# Patient Record
Sex: Female | Born: 1945 | Hispanic: No | Smoking: Never smoker
Health system: Southern US, Community
[De-identification: ages and names within clinical notes are randomized; demographics above are authoritative.]

## PROBLEM LIST (undated history)

## (undated) DIAGNOSIS — I10 Essential (primary) hypertension: Secondary | ICD-10-CM

## (undated) DIAGNOSIS — R609 Edema, unspecified: Secondary | ICD-10-CM

## (undated) DIAGNOSIS — I482 Chronic atrial fibrillation, unspecified: Principal | ICD-10-CM

## (undated) DIAGNOSIS — D62 Acute posthemorrhagic anemia: Secondary | ICD-10-CM

## (undated) DIAGNOSIS — K922 Gastrointestinal hemorrhage, unspecified: Secondary | ICD-10-CM

## (undated) DIAGNOSIS — Z950 Presence of cardiac pacemaker: Secondary | ICD-10-CM

## (undated) DIAGNOSIS — E119 Type 2 diabetes mellitus without complications: Secondary | ICD-10-CM

## (undated) DIAGNOSIS — R21 Rash and other nonspecific skin eruption: Secondary | ICD-10-CM

## (undated) DIAGNOSIS — I4891 Unspecified atrial fibrillation: Secondary | ICD-10-CM

## (undated) DIAGNOSIS — I5032 Chronic diastolic (congestive) heart failure: Secondary | ICD-10-CM

## (undated) DIAGNOSIS — K219 Gastro-esophageal reflux disease without esophagitis: Secondary | ICD-10-CM

## (undated) DIAGNOSIS — E785 Hyperlipidemia, unspecified: Secondary | ICD-10-CM

## (undated) DIAGNOSIS — J181 Lobar pneumonia, unspecified organism: Secondary | ICD-10-CM

## (undated) HISTORY — DX: Gastro-esophageal reflux disease without esophagitis: K21.9

## (undated) HISTORY — DX: Rash and other nonspecific skin eruption: R21

## (undated) HISTORY — DX: Chronic atrial fibrillation, unspecified: I48.20

## (undated) HISTORY — DX: Type 2 diabetes mellitus without complications: E11.9

## (undated) HISTORY — DX: Presence of cardiac pacemaker: Z95.0

## (undated) HISTORY — DX: Essential (primary) hypertension: I10

## (undated) HISTORY — DX: Lobar pneumonia, unspecified organism: J18.1

## (undated) HISTORY — DX: Edema, unspecified: R60.9

## (undated) HISTORY — DX: Hyperlipidemia, unspecified: E78.5

## (undated) HISTORY — PX: CATARACT EXTRACTION: SUR2

## (undated) HISTORY — DX: Gastrointestinal hemorrhage, unspecified: K92.2

## (undated) HISTORY — DX: Acute posthemorrhagic anemia: D62

## (undated) HISTORY — PX: AV FISTULA REPAIR: SHX563

---

## 1993-02-22 HISTORY — PX: ABDOMINAL HYSTERECTOMY: SHX81

## 2004-01-27 ENCOUNTER — Ambulatory Visit: Payer: Self-pay | Admitting: Internal Medicine

## 2011-11-08 ENCOUNTER — Other Ambulatory Visit: Payer: Self-pay | Admitting: Internal Medicine

## 2011-11-17 ENCOUNTER — Telehealth (INDEPENDENT_AMBULATORY_CARE_PROVIDER_SITE_OTHER): Payer: Self-pay

## 2011-11-18 NOTE — Telephone Encounter (Signed)
Close encounter 

## 2012-06-12 ENCOUNTER — Telehealth: Payer: Self-pay | Admitting: Internal Medicine

## 2012-06-12 NOTE — Telephone Encounter (Signed)
Opened in error

## 2013-05-09 ENCOUNTER — Ambulatory Visit: Payer: Self-pay

## 2013-07-10 ENCOUNTER — Ambulatory Visit (INDEPENDENT_AMBULATORY_CARE_PROVIDER_SITE_OTHER): Payer: PRIVATE HEALTH INSURANCE | Admitting: Podiatrist

## 2013-07-10 DIAGNOSIS — L84 Corns and callosities: Secondary | ICD-10-CM

## 2013-07-10 NOTE — Patient Instructions (Signed)
Told patient to call us if she needs [place]

## 2013-07-10 NOTE — Progress Notes (Signed)
I am here to get my diabetic shoes

## 2014-03-14 DIAGNOSIS — R791 Abnormal coagulation profile: Secondary | ICD-10-CM | POA: Diagnosis not present

## 2014-03-15 DIAGNOSIS — R040 Epistaxis: Secondary | ICD-10-CM | POA: Diagnosis not present

## 2014-03-15 DIAGNOSIS — E780 Pure hypercholesterolemia: Secondary | ICD-10-CM | POA: Diagnosis not present

## 2014-03-15 DIAGNOSIS — Z7901 Long term (current) use of anticoagulants: Secondary | ICD-10-CM | POA: Diagnosis not present

## 2014-03-15 DIAGNOSIS — I4891 Unspecified atrial fibrillation: Secondary | ICD-10-CM | POA: Diagnosis not present

## 2014-03-15 DIAGNOSIS — R58 Hemorrhage, not elsewhere classified: Secondary | ICD-10-CM | POA: Diagnosis not present

## 2014-03-15 DIAGNOSIS — I10 Essential (primary) hypertension: Secondary | ICD-10-CM | POA: Diagnosis not present

## 2014-03-15 DIAGNOSIS — E119 Type 2 diabetes mellitus without complications: Secondary | ICD-10-CM | POA: Diagnosis not present

## 2014-04-01 DIAGNOSIS — E538 Deficiency of other specified B group vitamins: Secondary | ICD-10-CM | POA: Diagnosis not present

## 2014-04-01 DIAGNOSIS — E785 Hyperlipidemia, unspecified: Secondary | ICD-10-CM | POA: Diagnosis not present

## 2014-04-01 DIAGNOSIS — E1149 Type 2 diabetes mellitus with other diabetic neurological complication: Secondary | ICD-10-CM | POA: Diagnosis not present

## 2014-04-01 DIAGNOSIS — E559 Vitamin D deficiency, unspecified: Secondary | ICD-10-CM | POA: Diagnosis not present

## 2014-04-03 DIAGNOSIS — E538 Deficiency of other specified B group vitamins: Secondary | ICD-10-CM | POA: Diagnosis not present

## 2014-04-03 DIAGNOSIS — I4891 Unspecified atrial fibrillation: Secondary | ICD-10-CM | POA: Diagnosis not present

## 2014-04-03 DIAGNOSIS — I10 Essential (primary) hypertension: Secondary | ICD-10-CM | POA: Diagnosis not present

## 2014-04-03 DIAGNOSIS — J208 Acute bronchitis due to other specified organisms: Secondary | ICD-10-CM | POA: Diagnosis not present

## 2014-04-03 DIAGNOSIS — E785 Hyperlipidemia, unspecified: Secondary | ICD-10-CM | POA: Diagnosis not present

## 2014-04-03 DIAGNOSIS — D638 Anemia in other chronic diseases classified elsewhere: Secondary | ICD-10-CM | POA: Diagnosis not present

## 2014-04-08 DIAGNOSIS — Z1212 Encounter for screening for malignant neoplasm of rectum: Secondary | ICD-10-CM | POA: Diagnosis not present

## 2014-04-10 DIAGNOSIS — M8589 Other specified disorders of bone density and structure, multiple sites: Secondary | ICD-10-CM | POA: Diagnosis not present

## 2014-04-10 DIAGNOSIS — Z1231 Encounter for screening mammogram for malignant neoplasm of breast: Secondary | ICD-10-CM | POA: Diagnosis not present

## 2014-04-10 DIAGNOSIS — M8580 Other specified disorders of bone density and structure, unspecified site: Secondary | ICD-10-CM | POA: Diagnosis not present

## 2014-04-13 DIAGNOSIS — J208 Acute bronchitis due to other specified organisms: Secondary | ICD-10-CM | POA: Diagnosis not present

## 2014-04-17 DIAGNOSIS — Z7901 Long term (current) use of anticoagulants: Secondary | ICD-10-CM | POA: Diagnosis not present

## 2014-05-01 DIAGNOSIS — R791 Abnormal coagulation profile: Secondary | ICD-10-CM | POA: Diagnosis not present

## 2014-05-08 DIAGNOSIS — Z7901 Long term (current) use of anticoagulants: Secondary | ICD-10-CM | POA: Diagnosis not present

## 2014-05-22 DIAGNOSIS — R791 Abnormal coagulation profile: Secondary | ICD-10-CM | POA: Diagnosis not present

## 2014-06-24 DIAGNOSIS — Z7901 Long term (current) use of anticoagulants: Secondary | ICD-10-CM | POA: Diagnosis not present

## 2014-07-04 DIAGNOSIS — Z45010 Encounter for checking and testing of cardiac pacemaker pulse generator [battery]: Secondary | ICD-10-CM | POA: Diagnosis not present

## 2014-07-04 DIAGNOSIS — I495 Sick sinus syndrome: Secondary | ICD-10-CM | POA: Diagnosis not present

## 2014-07-05 DIAGNOSIS — E785 Hyperlipidemia, unspecified: Secondary | ICD-10-CM | POA: Diagnosis not present

## 2014-07-05 DIAGNOSIS — D509 Iron deficiency anemia, unspecified: Secondary | ICD-10-CM | POA: Diagnosis not present

## 2014-07-05 DIAGNOSIS — E1149 Type 2 diabetes mellitus with other diabetic neurological complication: Secondary | ICD-10-CM | POA: Diagnosis not present

## 2014-07-05 DIAGNOSIS — E538 Deficiency of other specified B group vitamins: Secondary | ICD-10-CM | POA: Diagnosis not present

## 2014-07-08 DIAGNOSIS — E785 Hyperlipidemia, unspecified: Secondary | ICD-10-CM | POA: Diagnosis not present

## 2014-07-08 DIAGNOSIS — I480 Paroxysmal atrial fibrillation: Secondary | ICD-10-CM | POA: Diagnosis not present

## 2014-07-08 DIAGNOSIS — I10 Essential (primary) hypertension: Secondary | ICD-10-CM | POA: Diagnosis not present

## 2014-07-08 DIAGNOSIS — Z1389 Encounter for screening for other disorder: Secondary | ICD-10-CM | POA: Diagnosis not present

## 2014-07-08 DIAGNOSIS — E1149 Type 2 diabetes mellitus with other diabetic neurological complication: Secondary | ICD-10-CM | POA: Diagnosis not present

## 2014-07-08 DIAGNOSIS — Z9181 History of falling: Secondary | ICD-10-CM | POA: Diagnosis not present

## 2014-07-29 DIAGNOSIS — Z7901 Long term (current) use of anticoagulants: Secondary | ICD-10-CM | POA: Diagnosis not present

## 2014-08-30 DIAGNOSIS — Z7901 Long term (current) use of anticoagulants: Secondary | ICD-10-CM | POA: Diagnosis not present

## 2014-10-01 DIAGNOSIS — R791 Abnormal coagulation profile: Secondary | ICD-10-CM | POA: Diagnosis not present

## 2014-10-07 DIAGNOSIS — Z45010 Encounter for checking and testing of cardiac pacemaker pulse generator [battery]: Secondary | ICD-10-CM | POA: Diagnosis not present

## 2014-10-07 DIAGNOSIS — I498 Other specified cardiac arrhythmias: Secondary | ICD-10-CM | POA: Diagnosis not present

## 2014-10-14 DIAGNOSIS — E559 Vitamin D deficiency, unspecified: Secondary | ICD-10-CM | POA: Diagnosis not present

## 2014-10-14 DIAGNOSIS — E1149 Type 2 diabetes mellitus with other diabetic neurological complication: Secondary | ICD-10-CM | POA: Diagnosis not present

## 2014-10-14 DIAGNOSIS — D519 Vitamin B12 deficiency anemia, unspecified: Secondary | ICD-10-CM | POA: Diagnosis not present

## 2014-10-14 DIAGNOSIS — D509 Iron deficiency anemia, unspecified: Secondary | ICD-10-CM | POA: Diagnosis not present

## 2014-10-14 DIAGNOSIS — E785 Hyperlipidemia, unspecified: Secondary | ICD-10-CM | POA: Diagnosis not present

## 2014-10-17 DIAGNOSIS — E785 Hyperlipidemia, unspecified: Secondary | ICD-10-CM | POA: Diagnosis not present

## 2014-10-17 DIAGNOSIS — Z1389 Encounter for screening for other disorder: Secondary | ICD-10-CM | POA: Diagnosis not present

## 2014-10-17 DIAGNOSIS — I10 Essential (primary) hypertension: Secondary | ICD-10-CM | POA: Diagnosis not present

## 2014-10-17 DIAGNOSIS — I480 Paroxysmal atrial fibrillation: Secondary | ICD-10-CM | POA: Diagnosis not present

## 2014-10-17 DIAGNOSIS — E1140 Type 2 diabetes mellitus with diabetic neuropathy, unspecified: Secondary | ICD-10-CM | POA: Diagnosis not present

## 2014-10-21 DIAGNOSIS — D500 Iron deficiency anemia secondary to blood loss (chronic): Secondary | ICD-10-CM | POA: Diagnosis not present

## 2014-11-04 DIAGNOSIS — Z7901 Long term (current) use of anticoagulants: Secondary | ICD-10-CM | POA: Diagnosis not present

## 2014-11-05 DIAGNOSIS — K219 Gastro-esophageal reflux disease without esophagitis: Secondary | ICD-10-CM | POA: Diagnosis not present

## 2014-11-05 DIAGNOSIS — E119 Type 2 diabetes mellitus without complications: Secondary | ICD-10-CM | POA: Diagnosis not present

## 2014-11-05 DIAGNOSIS — Z950 Presence of cardiac pacemaker: Secondary | ICD-10-CM | POA: Diagnosis not present

## 2014-11-05 DIAGNOSIS — R195 Other fecal abnormalities: Secondary | ICD-10-CM | POA: Diagnosis not present

## 2014-11-05 DIAGNOSIS — D509 Iron deficiency anemia, unspecified: Secondary | ICD-10-CM | POA: Diagnosis not present

## 2014-11-05 DIAGNOSIS — D131 Benign neoplasm of stomach: Secondary | ICD-10-CM | POA: Diagnosis not present

## 2014-11-05 DIAGNOSIS — K449 Diaphragmatic hernia without obstruction or gangrene: Secondary | ICD-10-CM | POA: Diagnosis not present

## 2014-11-05 DIAGNOSIS — K5730 Diverticulosis of large intestine without perforation or abscess without bleeding: Secondary | ICD-10-CM | POA: Diagnosis not present

## 2014-11-05 DIAGNOSIS — D500 Iron deficiency anemia secondary to blood loss (chronic): Secondary | ICD-10-CM | POA: Diagnosis not present

## 2014-11-05 DIAGNOSIS — K2960 Other gastritis without bleeding: Secondary | ICD-10-CM | POA: Diagnosis not present

## 2014-11-05 DIAGNOSIS — I10 Essential (primary) hypertension: Secondary | ICD-10-CM | POA: Diagnosis not present

## 2014-11-05 DIAGNOSIS — Z1211 Encounter for screening for malignant neoplasm of colon: Secondary | ICD-10-CM | POA: Diagnosis not present

## 2014-11-05 DIAGNOSIS — M1990 Unspecified osteoarthritis, unspecified site: Secondary | ICD-10-CM | POA: Diagnosis not present

## 2014-11-05 DIAGNOSIS — K644 Residual hemorrhoidal skin tags: Secondary | ICD-10-CM | POA: Diagnosis not present

## 2014-11-05 DIAGNOSIS — Z7901 Long term (current) use of anticoagulants: Secondary | ICD-10-CM | POA: Diagnosis not present

## 2014-11-05 DIAGNOSIS — D124 Benign neoplasm of descending colon: Secondary | ICD-10-CM | POA: Diagnosis not present

## 2014-11-05 DIAGNOSIS — D122 Benign neoplasm of ascending colon: Secondary | ICD-10-CM | POA: Diagnosis not present

## 2014-11-15 DIAGNOSIS — R791 Abnormal coagulation profile: Secondary | ICD-10-CM | POA: Diagnosis not present

## 2014-12-16 DIAGNOSIS — R791 Abnormal coagulation profile: Secondary | ICD-10-CM | POA: Diagnosis not present

## 2014-12-23 DIAGNOSIS — Z7901 Long term (current) use of anticoagulants: Secondary | ICD-10-CM | POA: Diagnosis not present

## 2014-12-23 DIAGNOSIS — Z6841 Body Mass Index (BMI) 40.0 and over, adult: Secondary | ICD-10-CM | POA: Diagnosis not present

## 2014-12-23 DIAGNOSIS — J019 Acute sinusitis, unspecified: Secondary | ICD-10-CM | POA: Diagnosis not present

## 2014-12-23 DIAGNOSIS — J208 Acute bronchitis due to other specified organisms: Secondary | ICD-10-CM | POA: Diagnosis not present

## 2015-01-02 DIAGNOSIS — R791 Abnormal coagulation profile: Secondary | ICD-10-CM | POA: Diagnosis not present

## 2015-01-09 DIAGNOSIS — R791 Abnormal coagulation profile: Secondary | ICD-10-CM | POA: Diagnosis not present

## 2015-01-14 DIAGNOSIS — Z45010 Encounter for checking and testing of cardiac pacemaker pulse generator [battery]: Secondary | ICD-10-CM | POA: Diagnosis not present

## 2015-01-14 DIAGNOSIS — I498 Other specified cardiac arrhythmias: Secondary | ICD-10-CM | POA: Diagnosis not present

## 2015-01-17 DIAGNOSIS — R791 Abnormal coagulation profile: Secondary | ICD-10-CM | POA: Diagnosis not present

## 2015-01-20 DIAGNOSIS — K5730 Diverticulosis of large intestine without perforation or abscess without bleeding: Secondary | ICD-10-CM | POA: Diagnosis not present

## 2015-01-21 DIAGNOSIS — E559 Vitamin D deficiency, unspecified: Secondary | ICD-10-CM | POA: Diagnosis not present

## 2015-01-21 DIAGNOSIS — D519 Vitamin B12 deficiency anemia, unspecified: Secondary | ICD-10-CM | POA: Diagnosis not present

## 2015-01-21 DIAGNOSIS — E1140 Type 2 diabetes mellitus with diabetic neuropathy, unspecified: Secondary | ICD-10-CM | POA: Diagnosis not present

## 2015-01-21 DIAGNOSIS — D500 Iron deficiency anemia secondary to blood loss (chronic): Secondary | ICD-10-CM | POA: Diagnosis not present

## 2015-01-21 DIAGNOSIS — E785 Hyperlipidemia, unspecified: Secondary | ICD-10-CM | POA: Diagnosis not present

## 2015-01-23 DIAGNOSIS — E785 Hyperlipidemia, unspecified: Secondary | ICD-10-CM | POA: Diagnosis not present

## 2015-01-23 DIAGNOSIS — E1140 Type 2 diabetes mellitus with diabetic neuropathy, unspecified: Secondary | ICD-10-CM | POA: Diagnosis not present

## 2015-01-23 DIAGNOSIS — I10 Essential (primary) hypertension: Secondary | ICD-10-CM | POA: Diagnosis not present

## 2015-01-23 DIAGNOSIS — I480 Paroxysmal atrial fibrillation: Secondary | ICD-10-CM | POA: Diagnosis not present

## 2015-01-23 DIAGNOSIS — R791 Abnormal coagulation profile: Secondary | ICD-10-CM | POA: Diagnosis not present

## 2015-02-06 DIAGNOSIS — R791 Abnormal coagulation profile: Secondary | ICD-10-CM | POA: Diagnosis not present

## 2015-02-11 DIAGNOSIS — R040 Epistaxis: Secondary | ICD-10-CM | POA: Diagnosis not present

## 2015-02-11 DIAGNOSIS — Z6841 Body Mass Index (BMI) 40.0 and over, adult: Secondary | ICD-10-CM | POA: Diagnosis not present

## 2015-02-11 DIAGNOSIS — R791 Abnormal coagulation profile: Secondary | ICD-10-CM | POA: Diagnosis not present

## 2015-02-12 DIAGNOSIS — R040 Epistaxis: Secondary | ICD-10-CM | POA: Diagnosis not present

## 2015-02-12 DIAGNOSIS — J310 Chronic rhinitis: Secondary | ICD-10-CM | POA: Diagnosis not present

## 2015-02-12 DIAGNOSIS — T45511A Poisoning by anticoagulants, accidental (unintentional), initial encounter: Secondary | ICD-10-CM | POA: Diagnosis not present

## 2015-02-12 DIAGNOSIS — J342 Deviated nasal septum: Secondary | ICD-10-CM | POA: Diagnosis not present

## 2015-02-14 DIAGNOSIS — R791 Abnormal coagulation profile: Secondary | ICD-10-CM | POA: Diagnosis not present

## 2015-02-21 DIAGNOSIS — R791 Abnormal coagulation profile: Secondary | ICD-10-CM | POA: Diagnosis not present

## 2015-03-07 DIAGNOSIS — Z7901 Long term (current) use of anticoagulants: Secondary | ICD-10-CM | POA: Diagnosis not present

## 2015-04-09 DIAGNOSIS — Z7901 Long term (current) use of anticoagulants: Secondary | ICD-10-CM | POA: Diagnosis not present

## 2015-04-16 DIAGNOSIS — R791 Abnormal coagulation profile: Secondary | ICD-10-CM | POA: Diagnosis not present

## 2015-04-18 DIAGNOSIS — H524 Presbyopia: Secondary | ICD-10-CM | POA: Diagnosis not present

## 2015-04-18 DIAGNOSIS — I498 Other specified cardiac arrhythmias: Secondary | ICD-10-CM | POA: Diagnosis not present

## 2015-04-18 DIAGNOSIS — H43813 Vitreous degeneration, bilateral: Secondary | ICD-10-CM | POA: Diagnosis not present

## 2015-04-18 DIAGNOSIS — Z45010 Encounter for checking and testing of cardiac pacemaker pulse generator [battery]: Secondary | ICD-10-CM | POA: Diagnosis not present

## 2015-04-23 DIAGNOSIS — J208 Acute bronchitis due to other specified organisms: Secondary | ICD-10-CM | POA: Diagnosis not present

## 2015-04-23 DIAGNOSIS — J019 Acute sinusitis, unspecified: Secondary | ICD-10-CM | POA: Diagnosis not present

## 2015-04-28 DIAGNOSIS — E559 Vitamin D deficiency, unspecified: Secondary | ICD-10-CM | POA: Diagnosis not present

## 2015-04-28 DIAGNOSIS — E785 Hyperlipidemia, unspecified: Secondary | ICD-10-CM | POA: Diagnosis not present

## 2015-04-28 DIAGNOSIS — E1140 Type 2 diabetes mellitus with diabetic neuropathy, unspecified: Secondary | ICD-10-CM | POA: Diagnosis not present

## 2015-04-28 DIAGNOSIS — D519 Vitamin B12 deficiency anemia, unspecified: Secondary | ICD-10-CM | POA: Diagnosis not present

## 2015-04-28 DIAGNOSIS — D500 Iron deficiency anemia secondary to blood loss (chronic): Secondary | ICD-10-CM | POA: Diagnosis not present

## 2015-04-29 DIAGNOSIS — R791 Abnormal coagulation profile: Secondary | ICD-10-CM | POA: Diagnosis not present

## 2015-04-29 DIAGNOSIS — I10 Essential (primary) hypertension: Secondary | ICD-10-CM | POA: Diagnosis not present

## 2015-04-29 DIAGNOSIS — E1140 Type 2 diabetes mellitus with diabetic neuropathy, unspecified: Secondary | ICD-10-CM | POA: Diagnosis not present

## 2015-04-29 DIAGNOSIS — I480 Paroxysmal atrial fibrillation: Secondary | ICD-10-CM | POA: Diagnosis not present

## 2015-04-29 DIAGNOSIS — E785 Hyperlipidemia, unspecified: Secondary | ICD-10-CM | POA: Diagnosis not present

## 2015-05-01 DIAGNOSIS — K5730 Diverticulosis of large intestine without perforation or abscess without bleeding: Secondary | ICD-10-CM | POA: Diagnosis not present

## 2015-05-06 DIAGNOSIS — R791 Abnormal coagulation profile: Secondary | ICD-10-CM | POA: Diagnosis not present

## 2015-05-06 DIAGNOSIS — D649 Anemia, unspecified: Secondary | ICD-10-CM | POA: Diagnosis not present

## 2015-05-13 DIAGNOSIS — D649 Anemia, unspecified: Secondary | ICD-10-CM | POA: Diagnosis not present

## 2015-05-20 DIAGNOSIS — R791 Abnormal coagulation profile: Secondary | ICD-10-CM | POA: Diagnosis not present

## 2015-05-20 DIAGNOSIS — H6593 Unspecified nonsuppurative otitis media, bilateral: Secondary | ICD-10-CM | POA: Diagnosis not present

## 2015-05-20 DIAGNOSIS — J329 Chronic sinusitis, unspecified: Secondary | ICD-10-CM | POA: Diagnosis not present

## 2015-05-20 DIAGNOSIS — I480 Paroxysmal atrial fibrillation: Secondary | ICD-10-CM | POA: Diagnosis not present

## 2015-06-10 DIAGNOSIS — D649 Anemia, unspecified: Secondary | ICD-10-CM | POA: Diagnosis not present

## 2015-06-23 DIAGNOSIS — K5730 Diverticulosis of large intestine without perforation or abscess without bleeding: Secondary | ICD-10-CM | POA: Diagnosis not present

## 2015-06-23 DIAGNOSIS — Z7901 Long term (current) use of anticoagulants: Secondary | ICD-10-CM | POA: Diagnosis not present

## 2015-07-03 DIAGNOSIS — I498 Other specified cardiac arrhythmias: Secondary | ICD-10-CM | POA: Diagnosis not present

## 2015-07-03 DIAGNOSIS — Z950 Presence of cardiac pacemaker: Secondary | ICD-10-CM | POA: Diagnosis not present

## 2015-07-03 DIAGNOSIS — Z45018 Encounter for adjustment and management of other part of cardiac pacemaker: Secondary | ICD-10-CM | POA: Diagnosis not present

## 2015-07-25 DIAGNOSIS — Z7901 Long term (current) use of anticoagulants: Secondary | ICD-10-CM | POA: Diagnosis not present

## 2015-08-04 DIAGNOSIS — D500 Iron deficiency anemia secondary to blood loss (chronic): Secondary | ICD-10-CM | POA: Diagnosis not present

## 2015-08-04 DIAGNOSIS — E559 Vitamin D deficiency, unspecified: Secondary | ICD-10-CM | POA: Diagnosis not present

## 2015-08-04 DIAGNOSIS — E785 Hyperlipidemia, unspecified: Secondary | ICD-10-CM | POA: Diagnosis not present

## 2015-08-04 DIAGNOSIS — D519 Vitamin B12 deficiency anemia, unspecified: Secondary | ICD-10-CM | POA: Diagnosis not present

## 2015-08-04 DIAGNOSIS — E1140 Type 2 diabetes mellitus with diabetic neuropathy, unspecified: Secondary | ICD-10-CM | POA: Diagnosis not present

## 2015-08-06 DIAGNOSIS — E1140 Type 2 diabetes mellitus with diabetic neuropathy, unspecified: Secondary | ICD-10-CM | POA: Diagnosis not present

## 2015-08-06 DIAGNOSIS — I480 Paroxysmal atrial fibrillation: Secondary | ICD-10-CM | POA: Diagnosis not present

## 2015-08-06 DIAGNOSIS — E785 Hyperlipidemia, unspecified: Secondary | ICD-10-CM | POA: Diagnosis not present

## 2015-08-06 DIAGNOSIS — I10 Essential (primary) hypertension: Secondary | ICD-10-CM | POA: Diagnosis not present

## 2015-08-06 DIAGNOSIS — Z9181 History of falling: Secondary | ICD-10-CM | POA: Diagnosis not present

## 2015-08-12 DIAGNOSIS — Z1231 Encounter for screening mammogram for malignant neoplasm of breast: Secondary | ICD-10-CM | POA: Diagnosis not present

## 2015-08-25 DIAGNOSIS — Z7901 Long term (current) use of anticoagulants: Secondary | ICD-10-CM | POA: Diagnosis not present

## 2015-09-01 DIAGNOSIS — L03115 Cellulitis of right lower limb: Secondary | ICD-10-CM | POA: Diagnosis not present

## 2015-09-01 DIAGNOSIS — R600 Localized edema: Secondary | ICD-10-CM | POA: Diagnosis not present

## 2015-09-08 DIAGNOSIS — R791 Abnormal coagulation profile: Secondary | ICD-10-CM | POA: Diagnosis not present

## 2015-09-15 DIAGNOSIS — R791 Abnormal coagulation profile: Secondary | ICD-10-CM | POA: Diagnosis not present

## 2015-09-29 DIAGNOSIS — R791 Abnormal coagulation profile: Secondary | ICD-10-CM | POA: Diagnosis not present

## 2015-10-03 DIAGNOSIS — Z950 Presence of cardiac pacemaker: Secondary | ICD-10-CM | POA: Diagnosis not present

## 2015-10-06 DIAGNOSIS — R791 Abnormal coagulation profile: Secondary | ICD-10-CM | POA: Diagnosis not present

## 2015-10-15 DIAGNOSIS — I10 Essential (primary) hypertension: Secondary | ICD-10-CM

## 2015-10-15 DIAGNOSIS — I480 Paroxysmal atrial fibrillation: Secondary | ICD-10-CM | POA: Diagnosis not present

## 2015-10-15 DIAGNOSIS — S81802A Unspecified open wound, left lower leg, initial encounter: Secondary | ICD-10-CM | POA: Diagnosis not present

## 2015-10-15 DIAGNOSIS — I482 Chronic atrial fibrillation, unspecified: Secondary | ICD-10-CM

## 2015-10-15 DIAGNOSIS — E119 Type 2 diabetes mellitus without complications: Secondary | ICD-10-CM | POA: Insufficient documentation

## 2015-10-15 DIAGNOSIS — L03115 Cellulitis of right lower limb: Secondary | ICD-10-CM | POA: Diagnosis not present

## 2015-10-15 DIAGNOSIS — Z950 Presence of cardiac pacemaker: Secondary | ICD-10-CM | POA: Diagnosis not present

## 2015-10-15 DIAGNOSIS — I890 Lymphedema, not elsewhere classified: Secondary | ICD-10-CM | POA: Diagnosis not present

## 2015-10-15 DIAGNOSIS — S81801A Unspecified open wound, right lower leg, initial encounter: Secondary | ICD-10-CM | POA: Diagnosis not present

## 2015-10-15 HISTORY — DX: Presence of cardiac pacemaker: Z95.0

## 2015-10-15 HISTORY — DX: Essential (primary) hypertension: I10

## 2015-10-15 HISTORY — DX: Chronic atrial fibrillation, unspecified: I48.20

## 2015-10-15 HISTORY — DX: Type 2 diabetes mellitus without complications: E11.9

## 2015-10-16 DIAGNOSIS — D509 Iron deficiency anemia, unspecified: Secondary | ICD-10-CM | POA: Diagnosis not present

## 2015-10-16 DIAGNOSIS — E1140 Type 2 diabetes mellitus with diabetic neuropathy, unspecified: Secondary | ICD-10-CM | POA: Diagnosis not present

## 2015-10-16 DIAGNOSIS — I890 Lymphedema, not elsewhere classified: Secondary | ICD-10-CM | POA: Diagnosis not present

## 2015-10-16 DIAGNOSIS — I87313 Chronic venous hypertension (idiopathic) with ulcer of bilateral lower extremity: Secondary | ICD-10-CM | POA: Diagnosis not present

## 2015-10-16 DIAGNOSIS — I10 Essential (primary) hypertension: Secondary | ICD-10-CM | POA: Diagnosis not present

## 2015-10-16 DIAGNOSIS — L97222 Non-pressure chronic ulcer of left calf with fat layer exposed: Secondary | ICD-10-CM | POA: Diagnosis not present

## 2015-10-16 DIAGNOSIS — I4891 Unspecified atrial fibrillation: Secondary | ICD-10-CM | POA: Diagnosis not present

## 2015-10-16 DIAGNOSIS — L97212 Non-pressure chronic ulcer of right calf with fat layer exposed: Secondary | ICD-10-CM | POA: Diagnosis not present

## 2015-10-16 DIAGNOSIS — I872 Venous insufficiency (chronic) (peripheral): Secondary | ICD-10-CM | POA: Diagnosis not present

## 2015-10-16 DIAGNOSIS — L97821 Non-pressure chronic ulcer of other part of left lower leg limited to breakdown of skin: Secondary | ICD-10-CM | POA: Diagnosis not present

## 2015-10-16 DIAGNOSIS — L97811 Non-pressure chronic ulcer of other part of right lower leg limited to breakdown of skin: Secondary | ICD-10-CM | POA: Diagnosis not present

## 2015-10-20 DIAGNOSIS — R791 Abnormal coagulation profile: Secondary | ICD-10-CM | POA: Diagnosis not present

## 2015-10-21 DIAGNOSIS — L97222 Non-pressure chronic ulcer of left calf with fat layer exposed: Secondary | ICD-10-CM | POA: Diagnosis not present

## 2015-10-21 DIAGNOSIS — I87313 Chronic venous hypertension (idiopathic) with ulcer of bilateral lower extremity: Secondary | ICD-10-CM | POA: Diagnosis not present

## 2015-10-21 DIAGNOSIS — I890 Lymphedema, not elsewhere classified: Secondary | ICD-10-CM | POA: Diagnosis not present

## 2015-10-21 DIAGNOSIS — L97212 Non-pressure chronic ulcer of right calf with fat layer exposed: Secondary | ICD-10-CM | POA: Diagnosis not present

## 2015-10-21 DIAGNOSIS — E119 Type 2 diabetes mellitus without complications: Secondary | ICD-10-CM | POA: Diagnosis not present

## 2015-10-21 DIAGNOSIS — L97811 Non-pressure chronic ulcer of other part of right lower leg limited to breakdown of skin: Secondary | ICD-10-CM | POA: Diagnosis not present

## 2015-10-21 DIAGNOSIS — I872 Venous insufficiency (chronic) (peripheral): Secondary | ICD-10-CM | POA: Diagnosis not present

## 2015-10-21 DIAGNOSIS — L97909 Non-pressure chronic ulcer of unspecified part of unspecified lower leg with unspecified severity: Secondary | ICD-10-CM | POA: Diagnosis not present

## 2015-10-21 DIAGNOSIS — L97821 Non-pressure chronic ulcer of other part of left lower leg limited to breakdown of skin: Secondary | ICD-10-CM | POA: Diagnosis not present

## 2015-10-23 DIAGNOSIS — L97821 Non-pressure chronic ulcer of other part of left lower leg limited to breakdown of skin: Secondary | ICD-10-CM | POA: Diagnosis not present

## 2015-10-23 DIAGNOSIS — I872 Venous insufficiency (chronic) (peripheral): Secondary | ICD-10-CM | POA: Diagnosis not present

## 2015-10-23 DIAGNOSIS — L97811 Non-pressure chronic ulcer of other part of right lower leg limited to breakdown of skin: Secondary | ICD-10-CM | POA: Diagnosis not present

## 2015-10-23 DIAGNOSIS — L97909 Non-pressure chronic ulcer of unspecified part of unspecified lower leg with unspecified severity: Secondary | ICD-10-CM | POA: Diagnosis not present

## 2015-10-23 DIAGNOSIS — R600 Localized edema: Secondary | ICD-10-CM | POA: Diagnosis not present

## 2015-10-23 DIAGNOSIS — I890 Lymphedema, not elsewhere classified: Secondary | ICD-10-CM | POA: Diagnosis not present

## 2015-10-24 DIAGNOSIS — I480 Paroxysmal atrial fibrillation: Secondary | ICD-10-CM | POA: Diagnosis not present

## 2015-10-24 DIAGNOSIS — Z7901 Long term (current) use of anticoagulants: Secondary | ICD-10-CM | POA: Diagnosis not present

## 2015-10-28 DIAGNOSIS — I890 Lymphedema, not elsewhere classified: Secondary | ICD-10-CM | POA: Diagnosis not present

## 2015-10-28 DIAGNOSIS — I159 Secondary hypertension, unspecified: Secondary | ICD-10-CM | POA: Diagnosis not present

## 2015-10-28 DIAGNOSIS — E119 Type 2 diabetes mellitus without complications: Secondary | ICD-10-CM | POA: Diagnosis not present

## 2015-10-28 DIAGNOSIS — I87332 Chronic venous hypertension (idiopathic) with ulcer and inflammation of left lower extremity: Secondary | ICD-10-CM | POA: Diagnosis not present

## 2015-10-28 DIAGNOSIS — Z45010 Encounter for checking and testing of cardiac pacemaker pulse generator [battery]: Secondary | ICD-10-CM | POA: Diagnosis not present

## 2015-10-28 DIAGNOSIS — L97812 Non-pressure chronic ulcer of other part of right lower leg with fat layer exposed: Secondary | ICD-10-CM | POA: Diagnosis not present

## 2015-10-28 DIAGNOSIS — L97919 Non-pressure chronic ulcer of unspecified part of right lower leg with unspecified severity: Secondary | ICD-10-CM | POA: Diagnosis not present

## 2015-10-28 DIAGNOSIS — I87311 Chronic venous hypertension (idiopathic) with ulcer of right lower extremity: Secondary | ICD-10-CM | POA: Diagnosis not present

## 2015-10-28 DIAGNOSIS — L97821 Non-pressure chronic ulcer of other part of left lower leg limited to breakdown of skin: Secondary | ICD-10-CM | POA: Diagnosis not present

## 2015-10-28 DIAGNOSIS — L97811 Non-pressure chronic ulcer of other part of right lower leg limited to breakdown of skin: Secondary | ICD-10-CM | POA: Diagnosis not present

## 2015-10-28 DIAGNOSIS — L97822 Non-pressure chronic ulcer of other part of left lower leg with fat layer exposed: Secondary | ICD-10-CM | POA: Diagnosis not present

## 2015-10-28 DIAGNOSIS — I4891 Unspecified atrial fibrillation: Secondary | ICD-10-CM | POA: Diagnosis not present

## 2015-10-28 DIAGNOSIS — I872 Venous insufficiency (chronic) (peripheral): Secondary | ICD-10-CM | POA: Diagnosis not present

## 2015-10-30 DIAGNOSIS — Z01810 Encounter for preprocedural cardiovascular examination: Secondary | ICD-10-CM | POA: Diagnosis not present

## 2015-10-30 DIAGNOSIS — I10 Essential (primary) hypertension: Secondary | ICD-10-CM | POA: Diagnosis not present

## 2015-10-30 DIAGNOSIS — I517 Cardiomegaly: Secondary | ICD-10-CM | POA: Diagnosis not present

## 2015-10-30 DIAGNOSIS — I480 Paroxysmal atrial fibrillation: Secondary | ICD-10-CM | POA: Diagnosis not present

## 2015-10-30 DIAGNOSIS — Z7901 Long term (current) use of anticoagulants: Secondary | ICD-10-CM | POA: Diagnosis not present

## 2015-10-30 DIAGNOSIS — Z79899 Other long term (current) drug therapy: Secondary | ICD-10-CM | POA: Diagnosis not present

## 2015-10-30 DIAGNOSIS — Z7984 Long term (current) use of oral hypoglycemic drugs: Secondary | ICD-10-CM | POA: Diagnosis not present

## 2015-10-30 HISTORY — PX: PACEMAKER INSERTION: SHX728

## 2015-10-31 DIAGNOSIS — Z7984 Long term (current) use of oral hypoglycemic drugs: Secondary | ICD-10-CM | POA: Diagnosis not present

## 2015-10-31 DIAGNOSIS — I480 Paroxysmal atrial fibrillation: Secondary | ICD-10-CM | POA: Diagnosis not present

## 2015-10-31 DIAGNOSIS — Z7901 Long term (current) use of anticoagulants: Secondary | ICD-10-CM | POA: Diagnosis not present

## 2015-10-31 DIAGNOSIS — I10 Essential (primary) hypertension: Secondary | ICD-10-CM | POA: Diagnosis not present

## 2015-10-31 DIAGNOSIS — Z79899 Other long term (current) drug therapy: Secondary | ICD-10-CM | POA: Diagnosis not present

## 2015-11-06 DIAGNOSIS — Z7901 Long term (current) use of anticoagulants: Secondary | ICD-10-CM | POA: Diagnosis not present

## 2015-11-06 DIAGNOSIS — I4891 Unspecified atrial fibrillation: Secondary | ICD-10-CM | POA: Diagnosis not present

## 2015-11-10 DIAGNOSIS — E1140 Type 2 diabetes mellitus with diabetic neuropathy, unspecified: Secondary | ICD-10-CM | POA: Diagnosis not present

## 2015-11-10 DIAGNOSIS — E559 Vitamin D deficiency, unspecified: Secondary | ICD-10-CM | POA: Diagnosis not present

## 2015-11-10 DIAGNOSIS — D519 Vitamin B12 deficiency anemia, unspecified: Secondary | ICD-10-CM | POA: Diagnosis not present

## 2015-11-10 DIAGNOSIS — D500 Iron deficiency anemia secondary to blood loss (chronic): Secondary | ICD-10-CM | POA: Diagnosis not present

## 2015-11-10 DIAGNOSIS — E785 Hyperlipidemia, unspecified: Secondary | ICD-10-CM | POA: Diagnosis not present

## 2015-11-12 DIAGNOSIS — I10 Essential (primary) hypertension: Secondary | ICD-10-CM | POA: Diagnosis not present

## 2015-11-12 DIAGNOSIS — Z139 Encounter for screening, unspecified: Secondary | ICD-10-CM | POA: Diagnosis not present

## 2015-11-12 DIAGNOSIS — E1140 Type 2 diabetes mellitus with diabetic neuropathy, unspecified: Secondary | ICD-10-CM | POA: Diagnosis not present

## 2015-11-12 DIAGNOSIS — I480 Paroxysmal atrial fibrillation: Secondary | ICD-10-CM | POA: Diagnosis not present

## 2015-11-12 DIAGNOSIS — Z1389 Encounter for screening for other disorder: Secondary | ICD-10-CM | POA: Diagnosis not present

## 2015-11-12 DIAGNOSIS — E785 Hyperlipidemia, unspecified: Secondary | ICD-10-CM | POA: Diagnosis not present

## 2015-11-13 DIAGNOSIS — Z7901 Long term (current) use of anticoagulants: Secondary | ICD-10-CM | POA: Diagnosis not present

## 2015-11-13 DIAGNOSIS — I4891 Unspecified atrial fibrillation: Secondary | ICD-10-CM | POA: Diagnosis not present

## 2015-11-24 DIAGNOSIS — I83012 Varicose veins of right lower extremity with ulcer of calf: Secondary | ICD-10-CM | POA: Diagnosis not present

## 2015-11-24 DIAGNOSIS — I83022 Varicose veins of left lower extremity with ulcer of calf: Secondary | ICD-10-CM | POA: Diagnosis not present

## 2015-11-24 DIAGNOSIS — Z7901 Long term (current) use of anticoagulants: Secondary | ICD-10-CM | POA: Diagnosis not present

## 2015-11-24 DIAGNOSIS — I8391 Asymptomatic varicose veins of right lower extremity: Secondary | ICD-10-CM | POA: Diagnosis not present

## 2015-11-24 DIAGNOSIS — I83028 Varicose veins of left lower extremity with ulcer other part of lower leg: Secondary | ICD-10-CM | POA: Diagnosis not present

## 2015-11-27 DIAGNOSIS — R791 Abnormal coagulation profile: Secondary | ICD-10-CM | POA: Diagnosis not present

## 2015-12-04 DIAGNOSIS — R791 Abnormal coagulation profile: Secondary | ICD-10-CM | POA: Diagnosis not present

## 2015-12-05 DIAGNOSIS — I82811 Embolism and thrombosis of superficial veins of right lower extremities: Secondary | ICD-10-CM | POA: Diagnosis not present

## 2015-12-05 DIAGNOSIS — I83012 Varicose veins of right lower extremity with ulcer of calf: Secondary | ICD-10-CM | POA: Diagnosis not present

## 2015-12-05 DIAGNOSIS — I83022 Varicose veins of left lower extremity with ulcer of calf: Secondary | ICD-10-CM | POA: Diagnosis not present

## 2015-12-10 DIAGNOSIS — I4891 Unspecified atrial fibrillation: Secondary | ICD-10-CM | POA: Diagnosis not present

## 2015-12-10 DIAGNOSIS — E7800 Pure hypercholesterolemia, unspecified: Secondary | ICD-10-CM | POA: Diagnosis not present

## 2015-12-10 DIAGNOSIS — Z7901 Long term (current) use of anticoagulants: Secondary | ICD-10-CM | POA: Diagnosis not present

## 2015-12-10 DIAGNOSIS — Z950 Presence of cardiac pacemaker: Secondary | ICD-10-CM | POA: Diagnosis not present

## 2015-12-10 DIAGNOSIS — Z79899 Other long term (current) drug therapy: Secondary | ICD-10-CM | POA: Diagnosis not present

## 2015-12-10 DIAGNOSIS — I10 Essential (primary) hypertension: Secondary | ICD-10-CM | POA: Diagnosis not present

## 2015-12-10 DIAGNOSIS — R040 Epistaxis: Secondary | ICD-10-CM | POA: Diagnosis not present

## 2015-12-10 DIAGNOSIS — D500 Iron deficiency anemia secondary to blood loss (chronic): Secondary | ICD-10-CM | POA: Diagnosis not present

## 2015-12-10 DIAGNOSIS — E119 Type 2 diabetes mellitus without complications: Secondary | ICD-10-CM | POA: Diagnosis not present

## 2015-12-10 DIAGNOSIS — Z7984 Long term (current) use of oral hypoglycemic drugs: Secondary | ICD-10-CM | POA: Diagnosis not present

## 2015-12-11 DIAGNOSIS — R791 Abnormal coagulation profile: Secondary | ICD-10-CM | POA: Diagnosis not present

## 2015-12-19 DIAGNOSIS — Z7901 Long term (current) use of anticoagulants: Secondary | ICD-10-CM | POA: Diagnosis not present

## 2015-12-19 DIAGNOSIS — I83012 Varicose veins of right lower extremity with ulcer of calf: Secondary | ICD-10-CM | POA: Diagnosis not present

## 2015-12-19 DIAGNOSIS — Z01812 Encounter for preprocedural laboratory examination: Secondary | ICD-10-CM | POA: Diagnosis not present

## 2015-12-19 DIAGNOSIS — I83022 Varicose veins of left lower extremity with ulcer of calf: Secondary | ICD-10-CM | POA: Diagnosis not present

## 2015-12-19 DIAGNOSIS — I83028 Varicose veins of left lower extremity with ulcer other part of lower leg: Secondary | ICD-10-CM | POA: Diagnosis not present

## 2015-12-22 DIAGNOSIS — R791 Abnormal coagulation profile: Secondary | ICD-10-CM | POA: Diagnosis not present

## 2015-12-22 DIAGNOSIS — Z23 Encounter for immunization: Secondary | ICD-10-CM | POA: Diagnosis not present

## 2015-12-25 DIAGNOSIS — R791 Abnormal coagulation profile: Secondary | ICD-10-CM | POA: Diagnosis not present

## 2016-01-01 DIAGNOSIS — R791 Abnormal coagulation profile: Secondary | ICD-10-CM | POA: Diagnosis not present

## 2016-01-16 DIAGNOSIS — R791 Abnormal coagulation profile: Secondary | ICD-10-CM | POA: Diagnosis not present

## 2016-01-23 DIAGNOSIS — Z7901 Long term (current) use of anticoagulants: Secondary | ICD-10-CM | POA: Diagnosis not present

## 2016-01-23 DIAGNOSIS — Z9889 Other specified postprocedural states: Secondary | ICD-10-CM | POA: Diagnosis not present

## 2016-01-23 DIAGNOSIS — I83012 Varicose veins of right lower extremity with ulcer of calf: Secondary | ICD-10-CM | POA: Diagnosis not present

## 2016-01-23 DIAGNOSIS — I739 Peripheral vascular disease, unspecified: Secondary | ICD-10-CM | POA: Diagnosis not present

## 2016-01-30 DIAGNOSIS — I82812 Embolism and thrombosis of superficial veins of left lower extremities: Secondary | ICD-10-CM | POA: Diagnosis not present

## 2016-01-30 DIAGNOSIS — I83012 Varicose veins of right lower extremity with ulcer of calf: Secondary | ICD-10-CM | POA: Diagnosis not present

## 2016-01-30 DIAGNOSIS — I83022 Varicose veins of left lower extremity with ulcer of calf: Secondary | ICD-10-CM | POA: Diagnosis not present

## 2016-01-30 DIAGNOSIS — R791 Abnormal coagulation profile: Secondary | ICD-10-CM | POA: Diagnosis not present

## 2016-02-04 DIAGNOSIS — Z950 Presence of cardiac pacemaker: Secondary | ICD-10-CM | POA: Diagnosis not present

## 2016-02-06 DIAGNOSIS — R791 Abnormal coagulation profile: Secondary | ICD-10-CM | POA: Diagnosis not present

## 2016-02-11 DIAGNOSIS — D519 Vitamin B12 deficiency anemia, unspecified: Secondary | ICD-10-CM | POA: Diagnosis not present

## 2016-02-11 DIAGNOSIS — E785 Hyperlipidemia, unspecified: Secondary | ICD-10-CM | POA: Diagnosis not present

## 2016-02-11 DIAGNOSIS — E1140 Type 2 diabetes mellitus with diabetic neuropathy, unspecified: Secondary | ICD-10-CM | POA: Diagnosis not present

## 2016-02-11 DIAGNOSIS — D500 Iron deficiency anemia secondary to blood loss (chronic): Secondary | ICD-10-CM | POA: Diagnosis not present

## 2016-02-11 DIAGNOSIS — E559 Vitamin D deficiency, unspecified: Secondary | ICD-10-CM | POA: Diagnosis not present

## 2016-02-13 DIAGNOSIS — E1140 Type 2 diabetes mellitus with diabetic neuropathy, unspecified: Secondary | ICD-10-CM | POA: Diagnosis not present

## 2016-02-13 DIAGNOSIS — I10 Essential (primary) hypertension: Secondary | ICD-10-CM | POA: Diagnosis not present

## 2016-02-13 DIAGNOSIS — R791 Abnormal coagulation profile: Secondary | ICD-10-CM | POA: Diagnosis not present

## 2016-02-13 DIAGNOSIS — E785 Hyperlipidemia, unspecified: Secondary | ICD-10-CM | POA: Diagnosis not present

## 2016-02-13 DIAGNOSIS — I480 Paroxysmal atrial fibrillation: Secondary | ICD-10-CM | POA: Diagnosis not present

## 2016-09-14 DIAGNOSIS — D649 Anemia, unspecified: Secondary | ICD-10-CM

## 2016-09-14 DIAGNOSIS — N189 Chronic kidney disease, unspecified: Secondary | ICD-10-CM | POA: Diagnosis not present

## 2016-09-21 DIAGNOSIS — N189 Chronic kidney disease, unspecified: Secondary | ICD-10-CM

## 2016-09-21 DIAGNOSIS — E611 Iron deficiency: Secondary | ICD-10-CM | POA: Diagnosis not present

## 2016-09-21 DIAGNOSIS — D631 Anemia in chronic kidney disease: Secondary | ICD-10-CM

## 2016-12-09 DIAGNOSIS — N189 Chronic kidney disease, unspecified: Secondary | ICD-10-CM

## 2016-12-09 DIAGNOSIS — D631 Anemia in chronic kidney disease: Secondary | ICD-10-CM | POA: Diagnosis not present

## 2017-03-15 DIAGNOSIS — D509 Iron deficiency anemia, unspecified: Secondary | ICD-10-CM

## 2017-03-15 DIAGNOSIS — D631 Anemia in chronic kidney disease: Secondary | ICD-10-CM

## 2017-03-15 DIAGNOSIS — N189 Chronic kidney disease, unspecified: Secondary | ICD-10-CM

## 2017-05-20 ENCOUNTER — Encounter: Payer: Self-pay | Admitting: Sports Medicine

## 2017-05-20 ENCOUNTER — Ambulatory Visit (INDEPENDENT_AMBULATORY_CARE_PROVIDER_SITE_OTHER): Payer: 59 | Admitting: Sports Medicine

## 2017-05-20 VITALS — BP 141/76 | HR 98 | Ht 64.5 in | Wt 253.0 lb

## 2017-05-20 DIAGNOSIS — M79675 Pain in left toe(s): Secondary | ICD-10-CM

## 2017-05-20 DIAGNOSIS — D689 Coagulation defect, unspecified: Secondary | ICD-10-CM

## 2017-05-20 DIAGNOSIS — E1142 Type 2 diabetes mellitus with diabetic polyneuropathy: Secondary | ICD-10-CM

## 2017-05-20 DIAGNOSIS — I739 Peripheral vascular disease, unspecified: Secondary | ICD-10-CM

## 2017-05-20 DIAGNOSIS — B351 Tinea unguium: Secondary | ICD-10-CM | POA: Diagnosis not present

## 2017-05-20 DIAGNOSIS — M79674 Pain in right toe(s): Secondary | ICD-10-CM

## 2017-05-20 NOTE — Progress Notes (Signed)
Subjective: Latasha Guzman is a 72 y.o. female patient with history of diabetes who presents to office today complaining of long,mildly painful nails  while ambulating in shoes; unable to trim. Patient states that the glucose reading this morning was not recorded does not check every day.  Last time had A1c checked was 5.1.  Patient denies any new changes in medication or new problems.   Review of Systems  Cardiovascular: Positive for leg swelling.  Genitourinary: Positive for dysuria.  All other systems reviewed and are negative.   There are no active problems to display for this patient.  Current Outpatient Medications on File Prior to Visit  Medication Sig Dispense Refill  . atorvastatin (LIPITOR) 80 MG tablet     . diltiazem (CARDIZEM) 90 MG tablet     . fenofibrate 160 MG tablet     . hydrochlorothiazide (HYDRODIURIL) 25 MG tablet     . lisinopril (PRINIVIL,ZESTRIL) 20 MG tablet     . losartan (COZAAR) 100 MG tablet     . metFORMIN (GLUCOPHAGE) 1000 MG tablet     . omeprazole (PRILOSEC) 20 MG capsule     . PROAIR HFA 108 (90 Base) MCG/ACT inhaler     . warfarin (COUMADIN) 5 MG tablet      No current facility-administered medications on file prior to visit.    Allergies  Allergen Reactions  . Iodinated Diagnostic Agents   . Penicillins     No results found for this or any previous visit (from the past 2160 hour(s)).  Objective: General: Patient is awake, alert, and oriented x 3 and in no acute distress.  Integument: Skin is warm, dry and supple bilateral. Nails are tender, long, thickened and  dystrophic with subungual debris, consistent with onychomycosis, 1-5 bilateral.  Mild reactive callus medial first toe bilateral.  No signs of infection. No open lesions bilateral. Remaining integument unremarkable.  Vasculature:  Dorsalis Pedis pulse 1/4 bilateral. Posterior Tibial pulse  0/4 bilateral.  Capillary fill time <3 sec 1-5 bilateral.  No hair growth to the level of the  digits. Temperature gradient within normal limits.  Mild varicosities present bilateral.  1+ pitting edema present bilateral control with compression garments.   Neurology: The patient has intact absent measured with a 5.07/10g Semmes Weinstein Monofilament at all pedal sites bilateral. Vibratory sensation diminished bilateral with tuning fork. No Babinski sign present bilateral.   Musculoskeletal: Asymptomatic bunion, pes planus and hammertoe pedal deformities noted bilateral. Muscular strength 4/5 in all lower extremity muscular groups bilateral without pain on range of motion . No tenderness with calf compression bilateral.  Assessment and Plan: Problem List Items Addressed This Visit    None    Visit Diagnoses    Pain due to onychomycosis of toenails of both feet    -  Primary   Diabetic polyneuropathy associated with type 2 diabetes mellitus (HCC)       Relevant Medications   atorvastatin (LIPITOR) 80 MG tablet   lisinopril (PRINIVIL,ZESTRIL) 20 MG tablet   losartan (COZAAR) 100 MG tablet   metFORMIN (GLUCOPHAGE) 1000 MG tablet   PVD (peripheral vascular disease) (HCC)       Relevant Medications   atorvastatin (LIPITOR) 80 MG tablet   diltiazem (CARDIZEM) 90 MG tablet   fenofibrate 160 MG tablet   hydrochlorothiazide (HYDRODIURIL) 25 MG tablet   lisinopril (PRINIVIL,ZESTRIL) 20 MG tablet   losartan (COZAAR) 100 MG tablet   warfarin (COUMADIN) 5 MG tablet   Coagulopathy (HCC)         -  Examined patient. -Discussed and educated patient on diabetic foot care, especially with  regards to the vascular, neurological and musculoskeletal systems.  -Stressed the importance of good glycemic control and the detriment of not  controlling glucose levels in relation to the foot. -Mechanically debrided all nails 1-5 bilateral using sterile nail nipper and filed with dremel without incident  -Mechanically smoothed with rotary bur reactive callus at first toes bilateral without  incident -Continue with compression garments for assistance with edema control -Answered all patient questions -Patient to return  in 3 months for at risk foot care -Patient advised to call the office if any problems or questions arise in the meantime.  Patient may benefit from diabetic shoes in the future.  Landis Martins, DPM

## 2017-05-27 DIAGNOSIS — D631 Anemia in chronic kidney disease: Secondary | ICD-10-CM

## 2017-05-27 DIAGNOSIS — R634 Abnormal weight loss: Secondary | ICD-10-CM | POA: Diagnosis not present

## 2017-05-27 DIAGNOSIS — D509 Iron deficiency anemia, unspecified: Secondary | ICD-10-CM | POA: Diagnosis not present

## 2017-05-27 DIAGNOSIS — N189 Chronic kidney disease, unspecified: Secondary | ICD-10-CM

## 2017-07-24 ENCOUNTER — Inpatient Hospital Stay
Admission: AD | Admit: 2017-07-24 | Payer: Self-pay | Source: Other Acute Inpatient Hospital | Admitting: Internal Medicine

## 2017-07-24 DIAGNOSIS — Z8719 Personal history of other diseases of the digestive system: Secondary | ICD-10-CM | POA: Insufficient documentation

## 2017-07-24 DIAGNOSIS — K922 Gastrointestinal hemorrhage, unspecified: Secondary | ICD-10-CM

## 2017-07-24 HISTORY — DX: Gastrointestinal hemorrhage, unspecified: K92.2

## 2017-07-27 DIAGNOSIS — K219 Gastro-esophageal reflux disease without esophagitis: Secondary | ICD-10-CM

## 2017-07-27 DIAGNOSIS — E119 Type 2 diabetes mellitus without complications: Secondary | ICD-10-CM

## 2017-07-27 HISTORY — DX: Type 2 diabetes mellitus without complications: E11.9

## 2017-07-27 HISTORY — DX: Gastro-esophageal reflux disease without esophagitis: K21.9

## 2017-08-02 DIAGNOSIS — D62 Acute posthemorrhagic anemia: Secondary | ICD-10-CM

## 2017-08-02 DIAGNOSIS — J189 Pneumonia, unspecified organism: Secondary | ICD-10-CM

## 2017-08-02 DIAGNOSIS — R21 Rash and other nonspecific skin eruption: Secondary | ICD-10-CM | POA: Insufficient documentation

## 2017-08-02 DIAGNOSIS — J181 Lobar pneumonia, unspecified organism: Secondary | ICD-10-CM

## 2017-08-02 HISTORY — DX: Acute posthemorrhagic anemia: D62

## 2017-08-02 HISTORY — DX: Pneumonia, unspecified organism: J18.9

## 2017-08-02 HISTORY — DX: Rash and other nonspecific skin eruption: R21

## 2017-08-24 ENCOUNTER — Ambulatory Visit (INDEPENDENT_AMBULATORY_CARE_PROVIDER_SITE_OTHER): Payer: 59 | Admitting: Sports Medicine

## 2017-08-24 ENCOUNTER — Encounter: Payer: Self-pay | Admitting: Sports Medicine

## 2017-08-24 DIAGNOSIS — I739 Peripheral vascular disease, unspecified: Secondary | ICD-10-CM | POA: Diagnosis not present

## 2017-08-24 DIAGNOSIS — B351 Tinea unguium: Secondary | ICD-10-CM

## 2017-08-24 DIAGNOSIS — D689 Coagulation defect, unspecified: Secondary | ICD-10-CM | POA: Diagnosis not present

## 2017-08-24 DIAGNOSIS — M79674 Pain in right toe(s): Secondary | ICD-10-CM | POA: Diagnosis not present

## 2017-08-24 DIAGNOSIS — M79675 Pain in left toe(s): Secondary | ICD-10-CM

## 2017-08-24 DIAGNOSIS — E1142 Type 2 diabetes mellitus with diabetic polyneuropathy: Secondary | ICD-10-CM

## 2017-08-24 NOTE — Progress Notes (Signed)
Subjective: Latasha Guzman is a 72 y.o. female patient with history of diabetes who presents to office today complaining of long,mildly painful nails  while ambulating in shoes; unable to trim. Patient states that the glucose reading this morning was not recorded.  Last time had A1c checked was months ago and does not remember, Saw PCP Dr. Eddie North week ago.  Reports that she was in hospital for PNA but now she is doing better. Patient denies any other problems or issues.    There are no active problems to display for this patient.  Current Outpatient Medications on File Prior to Visit  Medication Sig Dispense Refill  . atorvastatin (LIPITOR) 80 MG tablet     . diltiazem (CARDIZEM) 90 MG tablet     . fenofibrate 160 MG tablet     . hydrochlorothiazide (HYDRODIURIL) 25 MG tablet     . lisinopril (PRINIVIL,ZESTRIL) 20 MG tablet     . losartan (COZAAR) 100 MG tablet     . metFORMIN (GLUCOPHAGE) 1000 MG tablet     . omeprazole (PRILOSEC) 20 MG capsule     . PROAIR HFA 108 (90 Base) MCG/ACT inhaler     . warfarin (COUMADIN) 5 MG tablet      No current facility-administered medications on file prior to visit.    Allergies  Allergen Reactions  . Iodinated Diagnostic Agents   . Penicillins     No results found for this or any previous visit (from the past 2160 hour(s)).  Objective: General: Patient is awake, alert, and oriented x 3 and in no acute distress.  Integument: Skin is warm, dry and supple bilateral. Nails are tender, long, thickened and  dystrophic with subungual debris, consistent with onychomycosis, 1-5 bilateral.  Mild reactive callus medial first toes bilateral with dry heme on right, pre-ulcerative in nature.  No signs of infection. No open lesions bilateral. Remaining integument unremarkable.  Vasculature:  Dorsalis Pedis pulse 1/4 bilateral. Posterior Tibial pulse  0/4 bilateral.  Capillary fill time <3 sec 1-5 bilateral.  No hair growth to the level of the  digits. Temperature gradient within normal limits.  Mild varicosities present bilateral.  1+ pitting edema present bilateral control with compression garments.   Neurology: The patient has intact absent measured with a 5.07/10g Semmes Weinstein Monofilament at all pedal sites bilateral. Vibratory sensation diminished bilateral with tuning fork. No Babinski sign present bilateral.   Musculoskeletal: Asymptomatic bunion, pes planus and hammertoe pedal deformities noted bilateral. Muscular strength 4/5 in all lower extremity muscular groups bilateral without pain on range of motion . No tenderness with calf compression bilateral.  Assessment and Plan: Problem List Items Addressed This Visit    None    Visit Diagnoses    Pain due to onychomycosis of toenails of both feet    -  Primary   Diabetic polyneuropathy associated with type 2 diabetes mellitus (HCC)       PVD (peripheral vascular disease) ([place])       Coagulopathy ([place])         -Examined patient. -Discussed and educated patient on diabetic foot care, especially with  regards to the vascular, neurological and musculoskeletal systems.  -Stressed the importance of good glycemic control and the detriment of not  controlling glucose levels in relation to the foot. -Mechanically debrided all nails 1-5 bilateral using sterile nail nipper and filed with dremel without incident  -Mechanically smoothed with rotary bur reactive callus at first toes bilateral without incident -Continue with compression garments for  assistance with edema control -Dispensed toe cushions to use at left 2nd webspace and protective bandaid to right great toe since area is pre-ulcerative in nature -Answered all patient questions -Patient to return  in 3 months for at risk foot care -Patient advised to call the office if any problems or questions arise in the meantime.  Patient may benefit from diabetic shoes in the future.  Landis Martins, DPM

## 2017-09-09 DIAGNOSIS — N189 Chronic kidney disease, unspecified: Secondary | ICD-10-CM | POA: Diagnosis not present

## 2017-09-09 DIAGNOSIS — D509 Iron deficiency anemia, unspecified: Secondary | ICD-10-CM

## 2017-09-09 DIAGNOSIS — D631 Anemia in chronic kidney disease: Secondary | ICD-10-CM

## 2017-09-22 ENCOUNTER — Other Ambulatory Visit (HOSPITAL_COMMUNITY)
Admission: RE | Admit: 2017-09-22 | Disposition: A | Discharge status: Home / Self Care | Payer: 59 | Source: Ambulatory Visit | Attending: Oncology | Admitting: Oncology

## 2017-09-29 DIAGNOSIS — N189 Chronic kidney disease, unspecified: Secondary | ICD-10-CM | POA: Diagnosis not present

## 2017-09-29 DIAGNOSIS — D631 Anemia in chronic kidney disease: Secondary | ICD-10-CM

## 2017-10-04 ENCOUNTER — Encounter (HOSPITAL_COMMUNITY): Payer: Self-pay | Admitting: Oncology

## 2017-11-01 DIAGNOSIS — N189 Chronic kidney disease, unspecified: Secondary | ICD-10-CM

## 2017-11-01 DIAGNOSIS — K649 Unspecified hemorrhoids: Secondary | ICD-10-CM | POA: Diagnosis not present

## 2017-11-01 DIAGNOSIS — E611 Iron deficiency: Secondary | ICD-10-CM

## 2017-11-01 DIAGNOSIS — D649 Anemia, unspecified: Secondary | ICD-10-CM

## 2017-11-01 DIAGNOSIS — D509 Iron deficiency anemia, unspecified: Secondary | ICD-10-CM

## 2017-11-01 DIAGNOSIS — D631 Anemia in chronic kidney disease: Secondary | ICD-10-CM | POA: Diagnosis not present

## 2017-11-01 DIAGNOSIS — Z7901 Long term (current) use of anticoagulants: Secondary | ICD-10-CM

## 2017-11-02 DIAGNOSIS — D509 Iron deficiency anemia, unspecified: Secondary | ICD-10-CM | POA: Diagnosis not present

## 2017-11-02 DIAGNOSIS — D649 Anemia, unspecified: Secondary | ICD-10-CM | POA: Diagnosis not present

## 2017-11-02 DIAGNOSIS — K649 Unspecified hemorrhoids: Secondary | ICD-10-CM | POA: Diagnosis not present

## 2017-11-02 DIAGNOSIS — Z7901 Long term (current) use of anticoagulants: Secondary | ICD-10-CM | POA: Diagnosis not present

## 2017-11-03 DIAGNOSIS — D649 Anemia, unspecified: Secondary | ICD-10-CM | POA: Diagnosis not present

## 2017-11-03 DIAGNOSIS — K649 Unspecified hemorrhoids: Secondary | ICD-10-CM | POA: Diagnosis not present

## 2017-11-03 DIAGNOSIS — Z7901 Long term (current) use of anticoagulants: Secondary | ICD-10-CM | POA: Diagnosis not present

## 2017-11-03 DIAGNOSIS — D509 Iron deficiency anemia, unspecified: Secondary | ICD-10-CM | POA: Diagnosis not present

## 2017-11-04 DIAGNOSIS — D649 Anemia, unspecified: Secondary | ICD-10-CM | POA: Diagnosis not present

## 2017-11-04 DIAGNOSIS — D509 Iron deficiency anemia, unspecified: Secondary | ICD-10-CM | POA: Diagnosis not present

## 2017-11-04 DIAGNOSIS — Z7901 Long term (current) use of anticoagulants: Secondary | ICD-10-CM | POA: Diagnosis not present

## 2017-11-04 DIAGNOSIS — K649 Unspecified hemorrhoids: Secondary | ICD-10-CM | POA: Diagnosis not present

## 2017-11-24 ENCOUNTER — Ambulatory Visit: Payer: 59 | Admitting: Sports Medicine

## 2017-12-27 ENCOUNTER — Emergency Department (HOSPITAL_BASED_OUTPATIENT_CLINIC_OR_DEPARTMENT_OTHER): Payer: 59

## 2017-12-27 ENCOUNTER — Encounter: Payer: Self-pay | Admitting: *Deleted

## 2017-12-27 ENCOUNTER — Ambulatory Visit (INDEPENDENT_AMBULATORY_CARE_PROVIDER_SITE_OTHER): Payer: 59 | Admitting: Cardiology

## 2017-12-27 ENCOUNTER — Other Ambulatory Visit: Payer: Self-pay

## 2017-12-27 ENCOUNTER — Inpatient Hospital Stay (HOSPITAL_BASED_OUTPATIENT_CLINIC_OR_DEPARTMENT_OTHER)
Admission: EM | Admit: 2017-12-27 | Discharge: 2018-01-01 | DRG: 308 | Disposition: A | Discharge status: Home Health | Payer: 59 | Attending: Internal Medicine | Admitting: Internal Medicine

## 2017-12-27 ENCOUNTER — Encounter (HOSPITAL_BASED_OUTPATIENT_CLINIC_OR_DEPARTMENT_OTHER): Payer: Self-pay | Admitting: Emergency Medicine

## 2017-12-27 VITALS — BP 104/56 | HR 153 | Ht 64.5 in | Wt 265.0 lb

## 2017-12-27 DIAGNOSIS — Z79899 Other long term (current) drug therapy: Secondary | ICD-10-CM | POA: Diagnosis not present

## 2017-12-27 DIAGNOSIS — Z950 Presence of cardiac pacemaker: Secondary | ICD-10-CM

## 2017-12-27 DIAGNOSIS — D649 Anemia, unspecified: Secondary | ICD-10-CM | POA: Diagnosis present

## 2017-12-27 DIAGNOSIS — Z7951 Long term (current) use of inhaled steroids: Secondary | ICD-10-CM | POA: Diagnosis not present

## 2017-12-27 DIAGNOSIS — Z6841 Body Mass Index (BMI) 40.0 and over, adult: Secondary | ICD-10-CM | POA: Diagnosis not present

## 2017-12-27 DIAGNOSIS — I10 Essential (primary) hypertension: Secondary | ICD-10-CM

## 2017-12-27 DIAGNOSIS — I5032 Chronic diastolic (congestive) heart failure: Secondary | ICD-10-CM

## 2017-12-27 DIAGNOSIS — Z8249 Family history of ischemic heart disease and other diseases of the circulatory system: Secondary | ICD-10-CM | POA: Diagnosis not present

## 2017-12-27 DIAGNOSIS — E1169 Type 2 diabetes mellitus with other specified complication: Secondary | ICD-10-CM | POA: Diagnosis not present

## 2017-12-27 DIAGNOSIS — E669 Obesity, unspecified: Secondary | ICD-10-CM | POA: Diagnosis present

## 2017-12-27 DIAGNOSIS — I5033 Acute on chronic diastolic (congestive) heart failure: Secondary | ICD-10-CM | POA: Diagnosis present

## 2017-12-27 DIAGNOSIS — E785 Hyperlipidemia, unspecified: Secondary | ICD-10-CM | POA: Diagnosis present

## 2017-12-27 DIAGNOSIS — Z833 Family history of diabetes mellitus: Secondary | ICD-10-CM | POA: Diagnosis not present

## 2017-12-27 DIAGNOSIS — Z8719 Personal history of other diseases of the digestive system: Secondary | ICD-10-CM

## 2017-12-27 DIAGNOSIS — Z90710 Acquired absence of both cervix and uterus: Secondary | ICD-10-CM | POA: Diagnosis not present

## 2017-12-27 DIAGNOSIS — E0801 Diabetes mellitus due to underlying condition with hyperosmolarity with coma: Secondary | ICD-10-CM | POA: Diagnosis not present

## 2017-12-27 DIAGNOSIS — E119 Type 2 diabetes mellitus without complications: Secondary | ICD-10-CM | POA: Diagnosis present

## 2017-12-27 DIAGNOSIS — I110 Hypertensive heart disease with heart failure: Secondary | ICD-10-CM | POA: Diagnosis present

## 2017-12-27 DIAGNOSIS — E1159 Type 2 diabetes mellitus with other circulatory complications: Secondary | ICD-10-CM | POA: Diagnosis not present

## 2017-12-27 DIAGNOSIS — I890 Lymphedema, not elsewhere classified: Secondary | ICD-10-CM | POA: Diagnosis present

## 2017-12-27 DIAGNOSIS — I4891 Unspecified atrial fibrillation: Secondary | ICD-10-CM | POA: Insufficient documentation

## 2017-12-27 DIAGNOSIS — I482 Chronic atrial fibrillation, unspecified: Secondary | ICD-10-CM

## 2017-12-27 DIAGNOSIS — I361 Nonrheumatic tricuspid (valve) insufficiency: Secondary | ICD-10-CM | POA: Diagnosis not present

## 2017-12-27 DIAGNOSIS — Z7901 Long term (current) use of anticoagulants: Secondary | ICD-10-CM

## 2017-12-27 DIAGNOSIS — I350 Nonrheumatic aortic (valve) stenosis: Secondary | ICD-10-CM | POA: Diagnosis not present

## 2017-12-27 HISTORY — DX: Chronic diastolic (congestive) heart failure: I50.32

## 2017-12-27 HISTORY — DX: Unspecified atrial fibrillation: I48.91

## 2017-12-27 LAB — CBC WITH DIFFERENTIAL/PLATELET
Abs Immature Granulocytes: 0.02 10*3/uL (ref 0.00–0.07)
BASOS PCT: 1 %
Basophils Absolute: 0.1 10*3/uL (ref 0.0–0.1)
EOS ABS: 0.2 10*3/uL (ref 0.0–0.5)
EOS PCT: 3 %
HCT: 31 % — ABNORMAL LOW (ref 36.0–46.0)
Hemoglobin: 9 g/dL — ABNORMAL LOW (ref 12.0–15.0)
Immature Granulocytes: 0 %
Lymphocytes Relative: 9 %
Lymphs Abs: 0.6 10*3/uL — ABNORMAL LOW (ref 0.7–4.0)
MCH: 27.2 pg (ref 26.0–34.0)
MCHC: 29 g/dL — AB (ref 30.0–36.0)
MCV: 93.7 fL (ref 80.0–100.0)
MONO ABS: 0.8 10*3/uL (ref 0.1–1.0)
Monocytes Relative: 11 %
Neutro Abs: 4.9 10*3/uL (ref 1.7–7.7)
Neutrophils Relative %: 76 %
PLATELETS: 224 10*3/uL (ref 150–400)
RBC: 3.31 MIL/uL — ABNORMAL LOW (ref 3.87–5.11)
RDW: 14.4 % (ref 11.5–15.5)
WBC: 6.6 10*3/uL (ref 4.0–10.5)
nRBC: 0 % (ref 0.0–0.2)

## 2017-12-27 LAB — BASIC METABOLIC PANEL
Anion gap: 11 (ref 5–15)
BUN: 18 mg/dL (ref 8–23)
CHLORIDE: 102 mmol/L (ref 98–111)
CO2: 26 mmol/L (ref 22–32)
CREATININE: 1.14 mg/dL — AB (ref 0.44–1.00)
Calcium: 9.4 mg/dL (ref 8.9–10.3)
GFR calc Af Amer: 55 mL/min — ABNORMAL LOW (ref >60)
GFR, EST NON AFRICAN AMERICAN: 47 mL/min — AB (ref >60)
Glucose, Bld: 127 mg/dL — ABNORMAL HIGH (ref 70–99)
Potassium: 3.6 mmol/L (ref 3.5–5.1)
Sodium: 139 mmol/L (ref 135–145)

## 2017-12-27 LAB — BRAIN NATRIURETIC PEPTIDE: B Natriuretic Peptide: 540.2 pg/mL — ABNORMAL HIGH (ref 0.0–100.0)

## 2017-12-27 LAB — TROPONIN I: Troponin I: 0.03 ng/mL (ref <0.03)

## 2017-12-27 MED ORDER — DILTIAZEM HCL-SODIUM CHLORIDE 100-0.9 MG/100ML-% IV SOLN: 100.0000 mg | INTRAVENOUS | Status: DC

## 2017-12-27 MED ORDER — FUROSEMIDE 10 MG/ML IJ SOLN
40.0000 mg | Freq: Two times a day (BID) | INTRAMUSCULAR | Status: DC
  Administered 2017-12-28 – 2017-12-30 (×5): 40 mg via INTRAVENOUS
  Filled 2017-12-27 (×4): qty 4

## 2017-12-27 MED ORDER — FUROSEMIDE 10 MG/ML IJ SOLN
80.0000 mg | Freq: Once | INTRAMUSCULAR | Status: AC
  Administered 2017-12-27: 80 mg via INTRAVENOUS
  Filled 2017-12-27: qty 8

## 2017-12-27 MED ORDER — DILTIAZEM HCL 100 MG IV SOLR
INTRAVENOUS | Status: AC
  Filled 2017-12-27: qty 100

## 2017-12-27 MED ORDER — PANTOPRAZOLE SODIUM 40 MG PO TBEC
40.0000 mg | DELAYED_RELEASE_TABLET | Freq: Every day | ORAL | Status: DC
  Administered 2017-12-27 – 2018-01-01 (×6): 40 mg via ORAL
  Filled 2017-12-27 (×6): qty 1

## 2017-12-27 MED ORDER — ACETAMINOPHEN 325 MG PO TABS
650.0000 mg | ORAL_TABLET | Freq: Four times a day (QID) | ORAL | Status: DC | PRN
  Administered 2017-12-28 – 2018-01-01 (×6): 650 mg via ORAL
  Filled 2017-12-27 (×6): qty 2

## 2017-12-27 MED ORDER — ALBUTEROL SULFATE (2.5 MG/3ML) 0.083% IN NEBU: 2.5000 mg | INHALATION_SOLUTION | Freq: Four times a day (QID) | RESPIRATORY_TRACT | Status: DC | PRN

## 2017-12-27 MED ORDER — APIXABAN 5 MG PO TABS
5.0000 mg | ORAL_TABLET | Freq: Two times a day (BID) | ORAL | Status: DC
Start: 2017-12-27 — End: 2017-12-28
  Administered 2017-12-27: 5 mg via ORAL
  Filled 2017-12-27: qty 1

## 2017-12-27 MED ORDER — GUAIFENESIN-DM 100-10 MG/5ML PO SYRP
5.0000 mL | ORAL_SOLUTION | ORAL | Status: DC | PRN
  Administered 2017-12-28 – 2017-12-29 (×4): 5 mL via ORAL
  Filled 2017-12-27 (×4): qty 5

## 2017-12-27 MED ORDER — INSULIN ASPART 100 UNIT/ML ~~LOC~~ SOLN: 0.0000 [IU] | Freq: Three times a day (TID) | SUBCUTANEOUS | Status: DC

## 2017-12-27 MED ORDER — ACETAMINOPHEN 650 MG RE SUPP: 650.0000 mg | Freq: Four times a day (QID) | RECTAL | Status: DC | PRN

## 2017-12-27 MED ORDER — POLYETHYLENE GLYCOL 3350 17 G PO PACK
17.0000 g | PACK | Freq: Every day | ORAL | Status: DC
  Administered 2017-12-27 – 2017-12-29 (×3): 17 g via ORAL
  Filled 2017-12-27 (×3): qty 1

## 2017-12-27 MED ORDER — DILTIAZEM HCL 100 MG IV SOLR
5.0000 mg/h | INTRAVENOUS | Status: DC
  Administered 2017-12-27: 5 mg/h via INTRAVENOUS
  Filled 2017-12-27 (×3): qty 100

## 2017-12-27 MED ORDER — MAGNESIUM HYDROXIDE 400 MG/5ML PO SUSP: 30.0000 mL | Freq: Every day | ORAL | Status: DC

## 2017-12-27 MED ORDER — ATORVASTATIN CALCIUM 80 MG PO TABS
80.0000 mg | ORAL_TABLET | Freq: Every day | ORAL | Status: DC
  Administered 2017-12-28 – 2017-12-31 (×4): 80 mg via ORAL
  Filled 2017-12-27 (×4): qty 1

## 2017-12-27 MED ORDER — DILTIAZEM HCL-DEXTROSE 100-5 MG/100ML-% IV SOLN (PREMIX)
5.0000 mg/h | INTRAVENOUS | Status: DC
  Administered 2017-12-27 – 2017-12-28 (×4): 15 mg/h via INTRAVENOUS
  Administered 2017-12-29 (×2): 12.5 mg/h via INTRAVENOUS
  Administered 2017-12-29: 10 mg/h via INTRAVENOUS
  Filled 2017-12-27 (×8): qty 100

## 2017-12-27 MED ORDER — SODIUM CHLORIDE 0.9% FLUSH
3.0000 mL | Freq: Two times a day (BID) | INTRAVENOUS | Status: DC
  Administered 2017-12-28 – 2018-01-01 (×9): 3 mL via INTRAVENOUS

## 2017-12-27 NOTE — Progress Notes (Signed)
Text page sent to admitting to inform MD  patient has arrived to [phone]

## 2017-12-27 NOTE — Progress Notes (Signed)
Cardiology Office Note:    Date:  12/27/2017   ID:  Latasha Guzman, DOB [birth year], MRN [id number]  PCP:  Nicoletta Dress, MD  Cardiologist:  Jenne Campus, MD    Referring MD: Nicoletta Dress, MD   Chief Complaint  Patient presents with  . Pre-op Exam  . Atrial Fibrillation  . Leg Swelling  I have swollen legs  History of Present Illness:    Latasha Guzman is a 72 y.o. female with multiple medical problems.  That include chronic atrial fibrillation, anticoagulated, diastolic congestive heart failure, chronic leg swelling with lymphedema, diabetes, hypertension she comes today to my office to be reestablished as a patient.  I seen him last time in April 2018.  And honestly when I look at her today I see clearly that she deteriorated quite significantly since I seen her last time.  Her story is very complicated apparently about a month ago she was in round of hospital because of anemia she did have gastroscopy as well as EGD done and no source of bleeding was identified.  She is scheduled to have capsule endoscopy to clarify the diagnosis of her bleeding.  She received some iron but became constipated and iron has been discontinued.  She tells me her last hemoglobin was 8 and she said it was very good.  She is to be taking Coumadin and then she was switched to Xarelto now she is on Eliquis.  She does have chronic swelling of lower extremities and actually her legs are wrapped in bandages today she said those bandages are changed quite frequently and they getting with very quickly.  She does not do much she sits in the chair tired and exhausted.  Denies having any palpitations.  Today physical exam and EKG confirmed the fact that she is in atrial for ablation fast ventricular rate.  She tells me she did not take her medications this morning yet.  She denies having a chest pain tightness squeezing pressure burning chest.  Past Medical History:  Diagnosis Date  . Acute blood loss anemia  08/02/2017  . Chronic atrial fibrillation 10/15/2015  . Diabetes mellitus ([place]) 07/27/2017  . Edema   . Essential hypertension 10/15/2015  . GERD (gastroesophageal reflux disease) 07/27/2017  . GI bleed 07/24/2017  . Hyperlipidemia   . Pacemaker 10/15/2015  . Pneumonia of right lower lobe due to infectious organism ([place]) 08/02/2017  . Skin rash 08/02/2017  . Type 2 diabetes mellitus without complication ([place]) 3/53/2992    Past Surgical History:  Procedure Laterality Date  . ABDOMINAL HYSTERECTOMY  1995   Total  . AV FISTULA REPAIR    . PACEMAKER INSERTION  10/30/2015   By Dr. Agustin Cree    Current Medications: Current Meds  Medication Sig  . apixaban (ELIQUIS) 5 MG TABS tablet Take 5 mg by mouth 2 (two) times daily.  Marland Kitchen atorvastatin (LIPITOR) 80 MG tablet   . Cholecalciferol (VITAMIN D3) 2000 units capsule Take 6,000 Units by mouth daily.  Marland Kitchen diltiazem (CARDIZEM) 90 MG tablet   . fenofibrate 160 MG tablet   . furosemide (LASIX) 80 MG tablet Take 1 tablet by mouth daily.  Marland Kitchen LORazepam (ATIVAN) 0.5 MG tablet Take 1 tablet by mouth as needed.  Marland Kitchen omeprazole (PRILOSEC) 20 MG capsule   . PROAIR HFA 108 (90 Base) MCG/ACT inhaler      Allergies:   Iodine; Iodinated diagnostic agents; and Penicillins   Social History   Socioeconomic History  . Marital status: Widowed  Spouse name: Not on file  . Number of children: Not on file  . Years of education: Not on file  . Highest education level: Not on file  Occupational History  . Not on file  Social Needs  . Financial resource strain: Not on file  . Food insecurity:    Worry: Not on file    Inability: Not on file  . Transportation needs:    Medical: Not on file    Non-medical: Not on file  Tobacco Use  . Smoking status: Never Smoker  . Smokeless tobacco: Never Used  Substance and Sexual Activity  . Alcohol use: Never    Frequency: Never  . Drug use: Never  . Sexual activity: Not on file  Lifestyle  . Physical activity:    Days  per week: Not on file    Minutes per session: Not on file  . Stress: Not on file  Relationships  . Social connections:    Talks on phone: Not on file    Gets together: Not on file    Attends religious service: Not on file    Active member of club or organization: Not on file    Attends meetings of clubs or organizations: Not on file    Relationship status: Not on file  Other Topics Concern  . Not on file  Social History Narrative  . Not on file     Family History: The patient's family history includes Diabetes in her maternal grandfather and mother; Heart attack in her father and paternal grandfather; Heart disease in her father, mother, and paternal grandfather; Hyperlipidemia in her mother; Hypertension in her maternal grandfather and mother; Stroke in her mother. ROS:   Please see the history of present illness.    All 14 point review of systems negative except as described per history of present illness  EKGs/Labs/Other Studies Reviewed:    Atrial fibrillation with fast ventricular rate 153 premature ventricular contractions.  Low voltage QRS nonspecific ST segment changes  Recent Labs: No results found for requested labs within last 8760 hours.  Recent Lipid Panel No results found for: CHOL, TRIG, HDL, CHOLHDL, VLDL, LDLCALC, LDLDIRECT  Physical Exam:    VS:  BP (!) 104/56   Pulse (!) 153   Ht 5' 4.5" (1.638 m)   Wt 265 lb (120.2 kg) Comment: Reported  SpO2 98%   BMI 44.78 kg/m     Wt Readings from Last 3 Encounters:  12/27/17 265 lb (120.2 kg)  05/20/17 253 lb (114.8 kg)     GEN:  Well nourished, well developed in no acute distress, pale HEENT: Normal NECK: JVD elevated even while she is sitting upright; No carotid bruits LYMPHATICS: No lymphadenopathy CARDIAC: Irregularly irregular, no murmurs, no rubs, no gallops.  Tachycardic. RESPIRATORY:  Clear to auscultation without rales, wheezing or rhonchi  ABDOMEN: Soft, non-tender, non-distended MUSCULOSKELETAL:   No edema; No deformity  SKIN: Warm and dry LOWER EXTREMITIES: 3+ pitting edema NEUROLOGIC:  Alert and oriented x 3 PSYCHIATRIC:  Normal affect   ASSESSMENT:    1. Chronic atrial fibrillation   2. Essential hypertension   3. Pacemaker    PLAN:    In order of problems listed above:  1. Chronic atrial fibrillation with accelerated ventricular rate.  The reason for this acceleration could be the fact that simply she did not take her medication this morning also the fact that she is anemic.  I think the best way to manage this lady will be to admit  her to the hospital.  I am going to bring her to the emergency room so we can start managing her appropriately.  She will require some AV blocking agents we may be able to give her a small dose of beta-blocker calcium channel blocker but she may require a medication like amiodarone to control her ventricular rate without dropping her blood pressure too much.  Her blood pressure systolics 540 today.  She is anticoagulated which I will continue for now with some reservations.  Again we will check a CBC and decide about that if we can continue with anticoagulation.  In the future we may consider watchman device. 2. Diastolic congestive heart failure with swelling of lower extremities.  Clearly she need to be diuresed.  She did not know anything about avoiding salt she did not know anything about restricting fluid she actually tells me that she drinks a lot of fluids because she thinks it is good for her obviously I explained to her that both eating salt and drinking too much fluid quite some problems for her and she need to avoid it in my opinion she need to stay in the hospital for a few days and have good diuresis.  That will improve her legs and hopefully she will not have to wrap her legs in the bandages and hopefully once that she got there will finally healed.  I cannot see her wounds to heal unless swelling will be taking her care of.  Also, checking her  Chem-12 will allow [place] to look at her albumin some protein.  I am worried she may be hypoalbuminemic because of oozing from her legs which contributes to her swelling.  She may have some third spacing as well. 3. Pacemaker present this is a [employer] device.  We will make arrangements for interrogation.  Last interrogation was done in December.  Interrogation of the device will allow [place] to look at her histogram and see what the heart rate variability is within last few months. 4. Diabetes mellitus obviously that will be taking care of by checking sugar on the regular basis and continuation of her medications.  She may benefit from Jardiance   Medication Adjustments/Labs and Tests Ordered: Current medicines are reviewed at length with the patient today.  Concerns regarding medicines are outlined above.  No orders of the defined types were placed in this encounter.  Medication changes: No orders of the defined types were placed in this encounter.   Signed, Park Liter, MD, [facility] 12/27/2017 12:28 PM    [place]

## 2017-12-27 NOTE — ED Notes (Signed)
Date and time results received: 12/27/17 1413  Test:trp Critical Value: 0.03  Name of Provider Notified: Tyrone Nine Orders Received? Or Actions Taken?: no orders given

## 2017-12-27 NOTE — Progress Notes (Signed)
Transfer from [facility] to [facility] Pt is a 72 y/o F with chronic AF on Eliquis, diastolic CHF, chronic BLE lymphedema, DM2, HTN. Seen in cards clinic upstairs today, he walked her down to ED as he was concerned she had deteriorated significantly since last visit. She has been compliant with lasix but LE edema significantly worse, has JVD to angle of mandible Lungs however sounded clear. Did not take meds this am prior to cards appt. HR 366Y, systolic in clinic was 403K, MAPs in 90s in ED. Received dilt with some improvement. Cards felt like she may need amiodarone if BP remains soft.  Also with recurrent GIB, last Hgb 8, but is 9 today, has a scheduled capsule endoscopy CXR bil plural effusions Coughing, fever to 100.6 BNP pending

## 2017-12-27 NOTE — ED Notes (Signed)
ED Provider at bedside, Dr. Tyrone Nine

## 2017-12-27 NOTE — ED Notes (Signed)
Report to [facility]. ETA 15 minutes  

## 2017-12-27 NOTE — H&P (Signed)
History and Physical    Latasha Guzman LPF:[id number] DOB: 1945/07/09 DOA: 12/27/2017  PCP: Nicoletta Dress, MD  Patient coming from: [facility] via Cardiology, Dr. Wendy Poet office.  I have personally briefly reviewed patient's old medical records in [place]  Chief Complaint: Worsening swelling of legs  HPI: Latasha APPELHANS is a 72 y.o. female with medical history significant for atrial fibrillation on Eliquis, s/p PPM, chronic diastolic CHF, type 2 diabetes, hyperlipidemia, hypertension, and chronic lymphedema of both lower extremities who was transferred to [facility] for atrial fibrillation with RVR and worsening edema.  She initially presented to her cardiologist's (Dr. Drue Dun) office to reestablish care.  She was noted to be in atrial fibrillation with accelerated ventricular rate and have significant swelling of both of her legs.  She was therefore sent to the [facility] emergency department.  Patient says that she has chronic lymphedema both legs but has noticed progressive swelling recently.  She has been taking Lasix 80 mg once daily.  It seems that she has been drinking a lot of fluids due to dry mouth.  She reports occasional palpitations.  She otherwise denies chest pain, dyspnea, orthopnea, abdominal pain, dysuria, or any obvious bleeding including hemoptysis, hematemesis, hematuria, hematochezia, or melena.  ED Course:  Initial vitals BP 122/68, pulse 115, RR 23, temp 98.7, SPO2 100% on 2L supplemental O2 [place].  Labs notable for WBC 6.6, hemoglobin 9.0, platelets 224.  Troponin I 0.03, BNP 540.2.  EKG showed atrial fibrillation with rapid ventricular response and PVCs.  2 view chest x-ray showed cardiomegaly with pulmonary vascular congestion.  She was started on a diltiazem infusion and given IV Lasix 80 mg once and transferred to [facility] for further care.  Review of Systems: As per HPI otherwise 10 point review of systems negative.    Past  Medical History:  Diagnosis Date  . Acute blood loss anemia 08/02/2017  . Chronic atrial fibrillation 10/15/2015  . Diabetes mellitus ([place]) 07/27/2017  . Edema   . Essential hypertension 10/15/2015  . GERD (gastroesophageal reflux disease) 07/27/2017  . GI bleed 07/24/2017  . Hyperlipidemia   . Pacemaker 10/15/2015  . Pneumonia of right lower lobe due to infectious organism ([place]) 08/02/2017  . Skin rash 08/02/2017  . Type 2 diabetes mellitus without complication ([place]) 5/32/9924    Past Surgical History:  Procedure Laterality Date  . ABDOMINAL HYSTERECTOMY  1995   Total  . AV FISTULA REPAIR    . PACEMAKER INSERTION  10/30/2015   By Dr. Agustin Cree     reports that she has never smoked. She has never used smokeless tobacco. She reports that she does not drink alcohol or use drugs.  Allergies  Allergen Reactions  . Iodine Hives, Other (See Comments) and Shortness Of Breath    Throat swells  . Iodinated Diagnostic Agents   . Penicillins     Family History  Problem Relation Age of Onset  . Stroke Mother   . Heart disease Mother   . Diabetes Mother   . Hypertension Mother   . Hyperlipidemia Mother   . Heart attack Father   . Heart disease Father   . Diabetes Maternal Grandfather   . Hypertension Maternal Grandfather   . Heart attack Paternal Grandfather   . Heart disease Paternal Grandfather      Prior to Admission medications   Medication Sig Start Date End Date Taking? Authorizing Provider  apixaban (ELIQUIS) 5 MG TABS tablet Take 5 mg  by mouth 2 (two) times daily.    [provider]  atorvastatin (LIPITOR) 80 MG tablet  03/23/17   [provider]  Cholecalciferol (VITAMIN D3) 2000 units capsule Take 6,000 Units by mouth daily.    [provider]  diltiazem (CARDIZEM) 90 MG tablet Take 2 mg by mouth 3 (three) times daily.  04/24/17   [provider]  fenofibrate 160 MG tablet  03/23/17   [provider]  furosemide (LASIX) 80 MG tablet  Take 1 tablet by mouth daily. 12/23/17   [provider]  LORazepam (ATIVAN) 0.5 MG tablet Take 1 tablet by mouth as needed. 09/02/17   [provider]  omeprazole (PRILOSEC) 20 MG capsule  03/23/17   [provider]  PROAIR HFA 108 [phone] Base) MCG/ACT inhaler  04/24/17   [provider]    Physical Exam: Vitals:   12/27/17 1741 12/27/17 1906 12/27/17 1937 12/28/17 0020  BP: 124/74 133/78 125/68 126/77  Pulse: (!) 115 (!) 108 (!) 120 (!) 59  Resp: (!) 21     Temp:  (!) 102 F (38.9 C) 98.6 F (37 C) 99.7 F (37.6 C)  TempSrc:  Oral Oral Oral  SpO2: 100% 98% 98% 98%  Weight:        Constitutional: NAD, calm, comfortable Eyes: PERRL, lids and conjunctivae normal ENMT: Mucous membranes are moist. Posterior pharynx clear of any exudate or lesions. Neck: normal, supple, no masses. Respiratory: clear to auscultation bilaterally, no wheezing, no crackles. Normal respiratory effort. No accessory muscle use.  Cardiovascular: tachycardic, irregularly irregular, no murmurs / rubs / gallops. Significant edema +3 bilateral lower extremities to above the knees Abdomen: no tenderness, no masses palpated. No hepatosplenomegaly. Bowel sounds positive.  Musculoskeletal: no clubbing / cyanosis. No joint deformity upper and lower extremities. Good ROM, no contractures. Normal muscle tone.  Skin: no rashes, lesions, ulcers. No induration Neurologic: CN 2-12 grossly intact. Sensation intact, DTR normal. Strength 5/5 in all 4, but decreased in legs due to swelling.  Psychiatric: Normal judgment and insight. Alert and oriented x 3. Normal mood.   Labs on Admission: I have personally reviewed following labs and imaging studies  CBC: Recent Labs  Lab 12/27/17 1258  WBC 6.6  NEUTROABS 4.9  HGB 9.0*  HCT 31.0*  MCV 93.7  PLT 774   Basic Metabolic Panel: Recent Labs  Lab 12/27/17 1258  NA 139  K 3.6  CL 102  CO2 26  GLUCOSE 127*  BUN 18  CREATININE 1.14*    CALCIUM 9.4   GFR: Estimated Creatinine Clearance: 58.3 mL/min (A) (by C-G formula based on SCr of 1.14 mg/dL (H)). Liver Function Tests: No results for input(s): AST, ALT, ALKPHOS, BILITOT, PROT, ALBUMIN in the last 168 hours. No results for input(s): LIPASE, AMYLASE in the last 168 hours. No results for input(s): AMMONIA in the last 168 hours. Coagulation Profile: No results for input(s): INR, PROTIME in the last 168 hours. Cardiac Enzymes: Recent Labs  Lab 12/27/17 1258  TROPONINI 0.03*   BNP (last 3 results) No results for input(s): PROBNP in the last 8760 hours. HbA1C: No results for input(s): HGBA1C in the last 72 hours. CBG: No results for input(s): GLUCAP in the last 168 hours. Lipid Profile: No results for input(s): CHOL, HDL, LDLCALC, TRIG, CHOLHDL, LDLDIRECT in the last 72 hours. Thyroid Function Tests: No results for input(s): TSH, T4TOTAL, FREET4, T3FREE, THYROIDAB in the last 72 hours. Anemia Panel: No results for input(s): VITAMINB12, FOLATE, FERRITIN, TIBC, IRON,  RETICCTPCT in the last 72 hours. Urine analysis: No results found for: COLORURINE, APPEARANCEUR, LABSPEC, [place], GLUCOSEU, [place], BILIRUBINUR, KETONESUR, PROTEINUR, UROBILINOGEN, NITRITE, LEUKOCYTESUR  Radiological Exams on Admission: Dg Chest 2 View  Result Date: 12/27/2017 CLINICAL DATA:  Acute shortness of breath EXAM: CHEST - 2 VIEW COMPARISON:  None. FINDINGS: Cardiomegaly and pulmonary vascular congestion noted. A LEFT pacemaker is noted. Trace bilateral pleural effusions are present There is no evidence of focal airspace disease, pulmonary edema, suspicious pulmonary nodule/mass or pneumothorax. No acute bony abnormalities are identified. IMPRESSION: Cardiomegaly with pulmonary vascular congestion and trace bilateral pleural effusions. Electronically Signed   By: Margarette Canada M.D.   On: 12/27/2017 13:20    EKG: Independently reviewed. atrial fibrillation with rapid ventricular response and  PVCs.  Assessment/Plan Principal Problem:   Atrial fibrillation with RVR (HCC) Active Problems:   Diabetes mellitus (HCC)   Essential hypertension   History of GI bleed   Chronic diastolic CHF (congestive heart failure) (HCC)   Lymphedema of both lower extremities   Hyperlipidemia associated with type 2 diabetes mellitus ([place])   Latasha Guzman is a 72 y.o. female with medical history significant for atrial fibrillation on Eliquis, s/p PPM, chronic diastolic CHF, type 2 diabetes, hyperlipidemia, hypertension, and chronic lymphedema of both lower extremities who was transferred to [facility] for atrial fibrillation with RVR and worsening edema.    Atrial fibrillation with RVR: -Continue diltiazem gtt -Has had issues with hypotension in the past, may need amiodarone for further rhythm/rate control -Continue Eliquis, monitor closely for signs/symptoms of bleeding  Chronic diastolic CHF: She has significant edema of both her legs, a lot of which is likely secondary to her chronic lymphedema.  Chest x-ray does show pulmonary vascular congestion. -Given IV Lasix 80 mg in the ED -Change to IV Lasix 40 mg twice daily tomorrow -Echocardiogram -Strict I/O's, daily weights -Monitor and replete K and Mg as needed   History of GI bleed: Has been evaluated over the last few months for occult GI bleeding.  Recently admitted at [facility] and had endoscopy without source of bleeding identified.  She is scheduled to undergo capsule endoscopy later this month for further evaluation.  She continues on Eliquis and denies any obvious bleeding. -Continuing Eliquis as above, monitor closely for signs/symptoms of bleeding -Hemoglobin stable at 9.0  Type 2 diabetes: -SSI  Hyperlipidemia: -Atorvastatin  DVT prophylaxis: Eliquis Code Status: Full code Family Communication: None present on admission Disposition Plan: Pending rate control and diuresis Consults called: None Admission  status: Inpatient   Zada Finders MD [facility] Pager [phone]  If 7PM-7AM, please contact night-coverage [email or web address] Password [email or web address]  12/28/2017, 1:19 AM

## 2017-12-27 NOTE — ED Provider Notes (Signed)
[facility] EMERGENCY DEPARTMENT Provider Note   CSN: [id number] Arrival date & time: 12/27/17  1225     History   Chief Complaint Chief Complaint  Patient presents with  . Atrial Fibrillation    HPI Latasha Guzman is a 72 y.o. female.  72 yo F with a chief complaint of shortness of breath weakness.  Going on for the past couple days.  Saw her primary cardiologist who sent her down here for evaluation and then admission.  Patient having worsening shortness of breath on exertion the past couple days.  Worsening lower extremity edema as well.  Is on 80 g of Lasix twice daily has been taking it with normal urinary output but worsening leg swelling.  She has had a mild cough and is been febrile to 100.6 at home.  She denies abdominal pain or vomiting denies chest pain.  Denies any recent medication change.  She did not take her medications this morning because she thought that may complicate her lab draw today at the office.  The history is provided by the patient.  Atrial Fibrillation  Associated symptoms include shortness of breath. Pertinent negatives include no chest pain and no headaches.  Illness  This is a new problem. The current episode started yesterday. The problem occurs constantly. The problem has been gradually worsening. Associated symptoms include shortness of breath. Pertinent negatives include no chest pain and no headaches. Nothing aggravates the symptoms. The symptoms are relieved by rest. She has tried nothing for the symptoms. The treatment provided no relief.    Past Medical History:  Diagnosis Date  . Acute blood loss anemia 08/02/2017  . Chronic atrial fibrillation 10/15/2015  . Diabetes mellitus ([place]) 07/27/2017  . Edema   . Essential hypertension 10/15/2015  . GERD (gastroesophageal reflux disease) 07/27/2017  . GI bleed 07/24/2017  . Hyperlipidemia   . Pacemaker 10/15/2015  . Pneumonia of right lower lobe due to infectious organism ([place]) 08/02/2017  .  Skin rash 08/02/2017  . Type 2 diabetes mellitus without complication ([place]) 9/47/6546    Patient Active Problem List   Diagnosis Date Noted  . Acute blood loss anemia 08/02/2017  . Pneumonia of right lower lobe due to infectious organism ([place]) 08/02/2017  . Skin rash 08/02/2017  . Diabetes mellitus ([place]) 07/27/2017  . GERD (gastroesophageal reflux disease) 07/27/2017  . GI bleed 07/24/2017  . Chronic atrial fibrillation 10/15/2015  . Essential hypertension 10/15/2015  . Pacemaker 10/15/2015  . Type 2 diabetes mellitus without complication ([place]) 50/35/4656    Past Surgical History:  Procedure Laterality Date  . ABDOMINAL HYSTERECTOMY  1995   Total  . AV FISTULA REPAIR    . PACEMAKER INSERTION  10/30/2015   By Dr. Agustin Cree     OB History   None      Home Medications    Prior to Admission medications   Medication Sig Start Date End Date Taking? Authorizing Provider  apixaban (ELIQUIS) 5 MG TABS tablet Take 5 mg by mouth 2 (two) times daily.    [provider]  atorvastatin (LIPITOR) 80 MG tablet  03/23/17   [provider]  Cholecalciferol (VITAMIN D3) 2000 units capsule Take 6,000 Units by mouth daily.    [provider]  diltiazem (CARDIZEM) 90 MG tablet  04/24/17   [provider]  fenofibrate 160 MG tablet  03/23/17   [provider]  furosemide (LASIX) 80 MG tablet Take 1 tablet by mouth daily. 12/23/17   [provider]  LORazepam (ATIVAN) 0.5 MG tablet Take 1 tablet by mouth as needed. 09/02/17   [provider]  omeprazole (PRILOSEC) 20 MG capsule  03/23/17   [provider]  PROAIR HFA 108 [phone] Base) MCG/ACT inhaler  04/24/17   [provider]    Family History Family History  Problem Relation Age of Onset  . Stroke Mother   . Heart disease Mother   . Diabetes Mother   . Hypertension Mother   . Hyperlipidemia Mother   . Heart attack Father   . Heart disease Father   . Diabetes  Maternal Grandfather   . Hypertension Maternal Grandfather   . Heart attack Paternal Grandfather   . Heart disease Paternal Grandfather     Social History Social History   Tobacco Use  . Smoking status: Never Smoker  . Smokeless tobacco: Never Used  Substance Use Topics  . Alcohol use: Never    Frequency: Never  . Drug use: Never     Allergies   Iodine; Iodinated diagnostic agents; and Penicillins   Review of Systems Review of Systems  Constitutional: Negative for chills and fever.  HENT: Negative for congestion and rhinorrhea.   Eyes: Negative for redness and visual disturbance.  Respiratory: Positive for shortness of breath. Negative for wheezing.   Cardiovascular: Positive for leg swelling. Negative for chest pain and palpitations.  Gastrointestinal: Negative for nausea and vomiting.  Genitourinary: Negative for dysuria and urgency.  Musculoskeletal: Negative for arthralgias and myalgias.  Skin: Negative for pallor and wound.  Neurological: Positive for weakness. Negative for dizziness and headaches.     Physical Exam Updated Vital Signs BP 128/71   Pulse (!) 121   Temp 98.7 F (37.1 C) (Oral)   Resp 20   Wt 120.2 kg   SpO2 100%   BMI 44.78 kg/m   Physical Exam  Constitutional: She is oriented to person, place, and time. She appears well-developed and well-nourished. No distress.  HENT:  Head: Normocephalic and atraumatic.  Eyes: Pupils are equal, round, and reactive to light. EOM are normal.  Neck: Normal range of motion. Neck supple. JVD present.  JVD to the angle the jaw bilaterally  Cardiovascular: Normal rate and regular rhythm. Exam reveals no gallop and no friction rub.  No murmur heard. Pulmonary/Chest: Effort normal. She has no wheezes. She has no rales.  Abdominal: Soft. She exhibits no distension. There is no tenderness.  Musculoskeletal: She exhibits edema. She exhibits no tenderness.  Angioedema, pitting edema up to the thigh.  Recent  surgical scar to the left lateral upper arm.  Well-healed sutures recently removed  Neurological: She is alert and oriented to person, place, and time.  Skin: Skin is warm and dry. She is not diaphoretic.  Psychiatric: She has a normal mood and affect. Her behavior is normal.  Nursing note and vitals reviewed.    ED Treatments / Results  Labs (all labs ordered are listed, but only abnormal results are displayed) Labs Reviewed  CBC WITH DIFFERENTIAL/PLATELET - Abnormal; Notable for the following components:      Result Value   RBC 3.31 (*)    Hemoglobin 9.0 (*)    HCT 31.0 (*)    MCHC 29.0 (*)    Lymphs Abs 0.6 (*)    All other components within normal limits  BASIC METABOLIC PANEL - Abnormal; Notable for the following components:   Glucose, Bld 127 (*)    Creatinine, Ser 1.14 (*)    GFR calc non Af Amer 47 (*)  GFR calc Af Amer 55 (*)    All other components within normal limits  TROPONIN I - Abnormal; Notable for the following components:   Troponin I 0.03 (*)    All other components within normal limits  BRAIN NATRIURETIC PEPTIDE    EKG EKG Interpretation  Date/Time:  Tuesday December 27 2017 12:41:54 EST Ventricular Rate:  156 PR Interval:    QRS Duration: 64 QT Interval:  294 QTC Calculation: 460 R Axis:   67 Text Interpretation:  Atrial fibrillation with rapid V-rate Paired ventricular premature complexes Low voltage, extremity and precordial leads Borderline T abnormalities, diffuse leads No old tracing to compare Confirmed by Deno Etienne [phone]) on 12/27/2017 2:17:39 PM   Radiology Dg Chest 2 View  Result Date: 12/27/2017 CLINICAL DATA:  Acute shortness of breath EXAM: CHEST - 2 VIEW COMPARISON:  None. FINDINGS: Cardiomegaly and pulmonary vascular congestion noted. A LEFT pacemaker is noted. Trace bilateral pleural effusions are present There is no evidence of focal airspace disease, pulmonary edema, suspicious pulmonary nodule/mass or pneumothorax. No acute  bony abnormalities are identified. IMPRESSION: Cardiomegaly with pulmonary vascular congestion and trace bilateral pleural effusions. Electronically Signed   By: Margarette Canada M.D.   On: 12/27/2017 13:20    Procedures Procedures (including critical care time)  Medications Ordered in ED Medications  diltiazem (CARDIZEM) 100 mg in dextrose 5 % 100 mL (1 mg/mL) infusion (15 mg/hr Intravenous Rate/Dose Verify 12/27/17 1440)  diltiazem (CARDIZEM) 100 MG injection (has no administration in time range)  furosemide (LASIX) injection 80 mg (80 mg Intravenous Given 12/27/17 1400)     Initial Impression / Assessment and Plan / ED Course  I have reviewed the triage vital signs and the nursing notes.  Pertinent labs & imaging results that were available during my care of the patient were reviewed by me and considered in my medical decision making (see chart for details).     72 yo F with a chief complaint of shortness of breath.  Is been going on for the past couple days.  She went to see her cardiologist upstairs in this building and was noted to be in A. fib with RVR.  She is also found to be floridly fluid overloaded.  He sent her down here for admission.  I discussed the case personally with him.  He initially had recommended amiodarone because her blood pressure was a bit low up stairs though down here she was having maps in the 90s.  I started her on a diltiazem drip with some improvement of rate.  Her troponin is positive but at the very bottom of the positive scale of 103.  Her renal function appears to be at baseline and her potassium is normal.  We will give a dose of 80 of Lasix.  CRITICAL CARE Performed by: Cecilio Asper   Total critical care time: 35 minutes  Critical care time was exclusive of separately billable procedures and treating other patients.  Critical care was necessary to treat or prevent imminent or life-threatening deterioration.  Critical care was time spent  personally by me on the following activities: development of treatment plan with patient and/or surrogate as well as nursing, discussions with consultants, evaluation of patient's response to treatment, examination of patient, obtaining history from patient or surrogate, ordering and performing treatments and interventions, ordering and review of laboratory studies, ordering and review of radiographic studies, pulse oximetry and re-evaluation of patient's condition.   The patients results and plan were reviewed and discussed.  Any x-rays performed were independently reviewed by myself.   Differential diagnosis were considered with the presenting HPI.  Medications  diltiazem (CARDIZEM) 100 mg in dextrose 5 % 100 mL (1 mg/mL) infusion (15 mg/hr Intravenous Rate/Dose Verify 12/27/17 1440)  diltiazem (CARDIZEM) 100 MG injection (has no administration in time range)  furosemide (LASIX) injection 80 mg (80 mg Intravenous Given 12/27/17 1400)    Vitals:   12/27/17 1317 12/27/17 1400 12/27/17 1430 12/27/17 1431  BP: 122/77 122/68 120/77 128/71  Pulse: (!) 141 (!) 115  (!) 121  Resp: (!) 21 (!) 23 20 20   Temp:      TempSrc:      SpO2: 100% 100% 98% 100%  Weight:        Final diagnoses:  Atrial fibrillation with rapid ventricular response (HCC)    Admission/ observation were discussed with the admitting physician, patient and/or family and they are comfortable with the plan.    Final Clinical Impressions(s) / ED Diagnoses   Final diagnoses:  Atrial fibrillation with rapid ventricular response [facility])    ED Discharge Orders    None       Deno Etienne, DO 12/27/17 1442

## 2017-12-27 NOTE — ED Triage Notes (Signed)
Sent from pmd offce upstairs for A-Fib w/RVR, eval for CHF, lymphedema.  Multiple complaint.

## 2017-12-27 NOTE — ED Notes (Signed)
Patient transported to X-ray 

## 2017-12-28 ENCOUNTER — Encounter (HOSPITAL_COMMUNITY): Payer: Self-pay | Admitting: General Practice

## 2017-12-28 ENCOUNTER — Ambulatory Visit: Payer: 59 | Admitting: Sports Medicine

## 2017-12-28 ENCOUNTER — Inpatient Hospital Stay (HOSPITAL_COMMUNITY): Payer: 59

## 2017-12-28 DIAGNOSIS — I350 Nonrheumatic aortic (valve) stenosis: Secondary | ICD-10-CM

## 2017-12-28 DIAGNOSIS — I4891 Unspecified atrial fibrillation: Secondary | ICD-10-CM

## 2017-12-28 DIAGNOSIS — I361 Nonrheumatic tricuspid (valve) insufficiency: Secondary | ICD-10-CM

## 2017-12-28 DIAGNOSIS — I10 Essential (primary) hypertension: Secondary | ICD-10-CM

## 2017-12-28 LAB — HEMOGLOBIN AND HEMATOCRIT, BLOOD
HCT: 27.4 % — ABNORMAL LOW (ref 36.0–46.0)
Hemoglobin: 8 g/dL — ABNORMAL LOW (ref 12.0–15.0)

## 2017-12-28 LAB — CBC
HEMATOCRIT: 26.4 % — AB (ref 36.0–46.0)
HEMOGLOBIN: 7.8 g/dL — AB (ref 12.0–15.0)
MCH: 26.9 pg (ref 26.0–34.0)
MCHC: 29.5 g/dL — ABNORMAL LOW (ref 30.0–36.0)
MCV: 91 fL (ref 80.0–100.0)
NRBC: 0 % (ref 0.0–0.2)
PLATELETS: 235 10*3/uL (ref 150–400)
RBC: 2.9 MIL/uL — AB (ref 3.87–5.11)
RDW: 14.5 % (ref 11.5–15.5)
WBC: 6.8 10*3/uL (ref 4.0–10.5)

## 2017-12-28 LAB — BASIC METABOLIC PANEL
ANION GAP: 5 (ref 5–15)
BUN: 14 mg/dL (ref 8–23)
CHLORIDE: 106 mmol/L (ref 98–111)
CO2: 28 mmol/L (ref 22–32)
Calcium: 9.1 mg/dL (ref 8.9–10.3)
Creatinine, Ser: 1.19 mg/dL — ABNORMAL HIGH (ref 0.44–1.00)
GFR calc Af Amer: 52 mL/min — ABNORMAL LOW (ref >60)
GFR, EST NON AFRICAN AMERICAN: 45 mL/min — AB (ref >60)
Glucose, Bld: 107 mg/dL — ABNORMAL HIGH (ref 70–99)
POTASSIUM: 3.1 mmol/L — AB (ref 3.5–5.1)
Sodium: 139 mmol/L (ref 135–145)

## 2017-12-28 LAB — MAGNESIUM: Magnesium: 1.9 mg/dL (ref 1.7–2.4)

## 2017-12-28 LAB — GLUCOSE, CAPILLARY
Glucose-Capillary: 102 mg/dL — ABNORMAL HIGH (ref 70–99)
Glucose-Capillary: 122 mg/dL — ABNORMAL HIGH (ref 70–99)
Glucose-Capillary: 112 mg/dL — ABNORMAL HIGH (ref 70–99)
Glucose-Capillary: 110 mg/dL — ABNORMAL HIGH (ref 70–99)

## 2017-12-28 LAB — HEMOGLOBIN A1C
Hgb A1c MFr Bld: 4.2 % — ABNORMAL LOW (ref 4.8–5.6)
Mean Plasma Glucose: 73.84 mg/dL

## 2017-12-28 LAB — TROPONIN I

## 2017-12-28 MED ORDER — POTASSIUM CHLORIDE CRYS ER 20 MEQ PO TBCR
40.0000 meq | EXTENDED_RELEASE_TABLET | Freq: Two times a day (BID) | ORAL | Status: AC
  Administered 2017-12-28 (×2): 40 meq via ORAL
  Filled 2017-12-28 (×2): qty 2

## 2017-12-28 MED ORDER — METOPROLOL TARTRATE 12.5 MG HALF TABLET
12.5000 mg | ORAL_TABLET | Freq: Two times a day (BID) | ORAL | Status: DC
  Administered 2017-12-28 – 2018-01-01 (×9): 12.5 mg via ORAL
  Filled 2017-12-28 (×9): qty 1

## 2017-12-28 NOTE — Progress Notes (Signed)
PROGRESS NOTE    Latasha Guzman  IRW:[id number] DOB: Oct 09, 1945 DOA: 12/27/2017 PCP: Nicoletta Dress, MD    Brief Narrative:  72 y.o. female with medical history significant for atrial fibrillation on Eliquis, s/p PPM, chronic diastolic CHF, type 2 diabetes, hyperlipidemia, hypertension, and chronic lymphedema of both lower extremities who was transferred to [facility] for atrial fibrillation with RVR and worsening edema.  She initially presented to her cardiologist's (Dr. Drue Dun) office to reestablish care.  She was noted to be in atrial fibrillation with accelerated ventricular rate and have significant swelling of both of her legs.  She was therefore sent to the [place] emergency department.  Patient says that she has chronic lymphedema both legs but has noticed progressive swelling recently.  She has been taking Lasix 80 mg once daily.  It seems that she has been drinking a lot of fluids due to dry mouth.  She reports occasional palpitations.  She otherwise denies chest pain, dyspnea, orthopnea, abdominal pain, dysuria, or any obvious bleeding including hemoptysis, hematemesis, hematuria, hematochezia, or melena.  In the ED, Initial vitals BP 122/68, pulse 115, RR 23, temp 98.7, SPO2 100% on 2L supplemental O2 .  Labs notable for WBC 6.6, hemoglobin 9.0, platelets 224.  Troponin I 0.03, BNP 540.2.  EKG showed atrial fibrillation with rapid ventricular response and PVCs.  2 view chest x-ray showed cardiomegaly with pulmonary vascular congestion.  She was started on a diltiazem infusion and given IV Lasix 80 mg once and transferred to [facility] for further care.  Assessment & Plan:   Principal Problem:   Atrial fibrillation with RVR (HCC) Active Problems:   Diabetes mellitus (HCC)   Essential hypertension   History of GI bleed   Chronic diastolic CHF (congestive heart failure) (HCC)   Lymphedema of both lower extremities   Hyperlipidemia associated  with type 2 diabetes mellitus (HCC)  Atrial fibrillation with RVR: -Started and continued on cardizem gtt. -This AM, HR remains poorly controlled despite cardizem.  -BP soft, however has hx of iodine allergy, thus not given amiodarone -Given trial of low dose metoprolol with improvement in HR. BP seems to tolerate thus far -Has had issues with hypotension in the past noted -CHADS of 4. Started on eliquis overnight, however hgb down to 7.8, repeat of 8.0. Hgb was 9 at time of admit, see below -Will hold further eliquis. Will ask Cardiology regarding anticoagulation recommendations moving forward as well as optimizing rate control in this patient  Chronic diastolic CHF: BLE edema of both her legs, a lot of which is likely secondary to her chronic lymphedema.   -Chest x-ray with findings worrisome for pulmonary vascular congestion. -Given IV Lasix 80 mg in the ED -Continue with IV Lasix 40 mg BID -Echocardiogram ordered, pending results -Continue strict I/O's, daily weights -Monitor and replete K and Mg as needed   History of GI bleed with acute blood loss anemia: -Outside records from [place] reviewed on EMR -Patient had been seen by Dr. Lyda Jester for concerns of GI bleed -Pt is s/p EGD and colon on 11/04/17 with findings of mild diverticular disease and external hemorrhoids as well as hiatal hernia, otherwise unremarkable studies -Per Dr. Lyda Jester, consideration for capsule study if anemia worsens -Will repeat cbc in aM  Type 2 diabetes: -Continue SSI coverage  Hyperlipidemia: -Continue with Atorvastatin as tolerated -Stable at present  DVT prophylaxis: SCD's Code Status: Full Family Communication: Pt in room, family not at bedside Disposition Plan: Uncertain at  this time  Consultants:   Cardiology  Procedures:     Antimicrobials: Anti-infectives (From admission, onward)   None       Subjective: Without chest pain or sob  Objective: Vitals:    12/28/17 0240 12/28/17 0623 12/28/17 1001 12/28/17 1300  BP:  108/66 120/68 111/87  Pulse:  (!) 111 (!) 115   Resp:  20    Temp:  98.4 F (36.9 C) 98.4 F (36.9 C)   TempSrc:  Oral Oral   SpO2:  96% 100%   Weight: 113.4 kg       Intake/Output Summary (Last 24 hours) at 12/28/2017 1422 Last data filed at 12/28/2017 1300 Gross per 24 hour  Intake 1230.23 ml  Output 1100 ml  Net 130.23 ml   Filed Weights   12/27/17 1238 12/28/17 0240  Weight: 120.2 kg 113.4 kg    Examination:  General exam: Appears calm and comfortable  Respiratory system: Clear to auscultation. Respiratory effort normal. Cardiovascular system: S1 & S2 heard, RRR Gastrointestinal system: Obese, nondistended Central nervous system: Alert and oriented. No focal neurological deficits. Extremities: Symmetric 5 x 5 power, BLE edematous Skin: No rashes, lesions  Psychiatry: Judgement and insight appear normal. Mood & affect appropriate.   Data Reviewed: I have personally reviewed following labs and imaging studies  CBC: Recent Labs  Lab 12/27/17 1258 12/28/17 0434  WBC 6.6 6.8  NEUTROABS 4.9  --   HGB 9.0* 7.8*  HCT 31.0* 26.4*  MCV 93.7 91.0  PLT 224 818   Basic Metabolic Panel: Recent Labs  Lab 12/27/17 1258 12/28/17 0434  NA 139 139  K 3.6 3.1*  CL 102 106  CO2 26 28  GLUCOSE 127* 107*  BUN 18 14  CREATININE 1.14* 1.19*  CALCIUM 9.4 9.1  MG  --  1.9   GFR: Estimated Creatinine Clearance: 54 mL/min (A) (by C-G formula based on SCr of 1.19 mg/dL (H)). Liver Function Tests: No results for input(s): AST, ALT, ALKPHOS, BILITOT, PROT, ALBUMIN in the last 168 hours. No results for input(s): LIPASE, AMYLASE in the last 168 hours. No results for input(s): AMMONIA in the last 168 hours. Coagulation Profile: No results for input(s): INR, PROTIME in the last 168 hours. Cardiac Enzymes: Recent Labs  Lab 12/27/17 1258 12/28/17 0434  TROPONINI 0.03* <0.03   BNP (last 3 results) No results for  input(s): PROBNP in the last 8760 hours. HbA1C: Recent Labs    12/28/17 0434  HGBA1C 4.2*   CBG: Recent Labs  Lab 12/28/17 0751 12/28/17 1212  GLUCAP 102* 122*   Lipid Profile: No results for input(s): CHOL, HDL, LDLCALC, TRIG, CHOLHDL, LDLDIRECT in the last 72 hours. Thyroid Function Tests: No results for input(s): TSH, T4TOTAL, FREET4, T3FREE, THYROIDAB in the last 72 hours. Anemia Panel: No results for input(s): VITAMINB12, FOLATE, FERRITIN, TIBC, IRON, RETICCTPCT in the last 72 hours. Sepsis Labs: No results for input(s): PROCALCITON, LATICACIDVEN in the last 168 hours.  No results found for this or any previous visit (from the past 240 hour(s)).   Radiology Studies: Dg Chest 2 View  Result Date: 12/27/2017 CLINICAL DATA:  Acute shortness of breath EXAM: CHEST - 2 VIEW COMPARISON:  None. FINDINGS: Cardiomegaly and pulmonary vascular congestion noted. A LEFT pacemaker is noted. Trace bilateral pleural effusions are present There is no evidence of focal airspace disease, pulmonary edema, suspicious pulmonary nodule/mass or pneumothorax. No acute bony abnormalities are identified. IMPRESSION: Cardiomegaly with pulmonary vascular congestion and trace bilateral pleural effusions. Electronically Signed  By: Margarette Canada M.D.   On: 12/27/2017 13:20    Scheduled Meds: . atorvastatin  80 mg Oral q1800  . furosemide  40 mg Intravenous BID  . insulin aspart  0-9 Units Subcutaneous TID WC  . metoprolol tartrate  12.5 mg Oral BID  . pantoprazole  40 mg Oral Daily  . polyethylene glycol  17 g Oral Daily  . potassium chloride  40 mEq Oral BID  . sodium chloride flush  3 mL Intravenous Q12H   Continuous Infusions: . dilTIAZem HCl-Dextrose 15 mg/hr (12/28/17 1000)     LOS: 1 day   Marylu Lund, MD [facility] Pager On Amion  If 7PM-7AM, please contact night-coverage 12/28/2017, 2:22 PM

## 2017-12-28 NOTE — Addendum Note (Signed)
Addended by: Ashok Norris on: 12/28/2017 08:07 AM   Modules accepted: Orders

## 2017-12-28 NOTE — Consult Note (Addendum)
[facility] Nurse wound consult note Reason for Consult: Consult requested for bilat legs and toes.  Pt states she is followed as an outpatient by podiatry and they have debrided her great toe callous areas and ordered Aquacel dressings daily prior to admission.  She has chronic lymphademia and legs began blistering and leaking earlier this week.  Wound type: Left great toe with full thickness wound; 2X1X.2cm, red and moist, mod amt red drainage, no odor Right great toe with full thickness wound; 2X2X.2cm, red and moist, mod amt red drainage, no odor Left inner thigh with 2 partial thickness wounds where previous blisters have ruptured. 4X4X.1cm and 3X3X.1cm, red and moist, mod amt yellow drainage, no odor or fluctuance. Right inner thigh with partial thickness wounds where previous blisters have ruptured. 7X7X.1cm, red and moist, mod amt yellow drainage, no odor or fluctuance. Right outer thigh with partial thickness wounds where previous blisters have ruptured. 3X3X.1cm, red and moist, mod amt yellow drainage, no odor or fluctuance. Dressing procedure/placement/frequency: Continue present plan of care to bilat toes with Aquacel to absorb drainage and provide antimicrobial benefits.  ABD pads and kerlex to absorb drainage to right thigh wounds.  Foam dressing to promote healing to left thigh wounds, which are not draining as heavily.  Please re-consult if further assistance is needed.  Thank-you,  Julien Girt MSN, [place], [place], [place], [place]

## 2017-12-29 ENCOUNTER — Encounter (HOSPITAL_COMMUNITY): Payer: Self-pay | Admitting: Physician Assistant

## 2017-12-29 DIAGNOSIS — I890 Lymphedema, not elsewhere classified: Secondary | ICD-10-CM

## 2017-12-29 DIAGNOSIS — E1159 Type 2 diabetes mellitus with other circulatory complications: Secondary | ICD-10-CM

## 2017-12-29 DIAGNOSIS — D649 Anemia, unspecified: Secondary | ICD-10-CM

## 2017-12-29 DIAGNOSIS — E785 Hyperlipidemia, unspecified: Secondary | ICD-10-CM

## 2017-12-29 DIAGNOSIS — I5032 Chronic diastolic (congestive) heart failure: Secondary | ICD-10-CM

## 2017-12-29 DIAGNOSIS — E1169 Type 2 diabetes mellitus with other specified complication: Secondary | ICD-10-CM

## 2017-12-29 LAB — COMPREHENSIVE METABOLIC PANEL
ALBUMIN: 2.5 g/dL — AB (ref 3.5–5.0)
ALT: 7 U/L (ref 0–44)
ANION GAP: 8 (ref 5–15)
AST: 31 U/L (ref 15–41)
Alkaline Phosphatase: 49 U/L (ref 38–126)
BILIRUBIN TOTAL: 1.5 mg/dL — AB (ref 0.3–1.2)
BUN: 16 mg/dL (ref 8–23)
CO2: 23 mmol/L (ref 22–32)
Calcium: 9 mg/dL (ref 8.9–10.3)
Chloride: 107 mmol/L (ref 98–111)
Creatinine, Ser: 1.21 mg/dL — ABNORMAL HIGH (ref 0.44–1.00)
GFR calc non Af Amer: 44 mL/min — ABNORMAL LOW (ref >60)
GFR, EST AFRICAN AMERICAN: 51 mL/min — AB (ref >60)
GLUCOSE: 97 mg/dL (ref 70–99)
POTASSIUM: 4.1 mmol/L (ref 3.5–5.1)
SODIUM: 138 mmol/L (ref 135–145)
Total Protein: 5.4 g/dL — ABNORMAL LOW (ref 6.5–8.1)

## 2017-12-29 LAB — CBC
HCT: 26.1 % — ABNORMAL LOW (ref 36.0–46.0)
HEMOGLOBIN: 7.7 g/dL — AB (ref 12.0–15.0)
MCH: 26.9 pg (ref 26.0–34.0)
MCHC: 29.5 g/dL — ABNORMAL LOW (ref 30.0–36.0)
MCV: 91.3 fL (ref 80.0–100.0)
NRBC: 0 % (ref 0.0–0.2)
PLATELETS: 216 10*3/uL (ref 150–400)
RBC: 2.86 MIL/uL — AB (ref 3.87–5.11)
RDW: 14.3 % (ref 11.5–15.5)
WBC: 5.8 10*3/uL (ref 4.0–10.5)

## 2017-12-29 LAB — ECHOCARDIOGRAM COMPLETE
HEIGHTINCHES: 64.5 in
Weight: 4000 oz

## 2017-12-29 LAB — GLUCOSE, CAPILLARY
Glucose-Capillary: 112 mg/dL — ABNORMAL HIGH (ref 70–99)
Glucose-Capillary: 95 mg/dL (ref 70–99)
Glucose-Capillary: 97 mg/dL (ref 70–99)

## 2017-12-29 MED ORDER — DILTIAZEM HCL 60 MG PO TABS
90.0000 mg | ORAL_TABLET | Freq: Four times a day (QID) | ORAL | Status: DC
  Administered 2017-12-29 – 2018-01-01 (×12): 90 mg via ORAL
  Filled 2017-12-29 (×12): qty 2

## 2017-12-29 MED ORDER — POLYETHYLENE GLYCOL 3350 17 G PO PACK
17.0000 g | PACK | Freq: Two times a day (BID) | ORAL | Status: DC
  Administered 2017-12-29 – 2017-12-31 (×5): 17 g via ORAL
  Filled 2017-12-29 (×6): qty 1

## 2017-12-29 NOTE — Consult Note (Signed)
[facility] Consult  Referring Provider: Donne Hazel, MD Primary Care Physician:  Nicoletta Dress, MD Primary Gastroenterologist: Unassigned/Dr Misenheimer([place])  Reason for Consultation: Unexplained anemia, requirement for anticoagulation  HPI: Latasha Guzman is a 72 y.o. female was admitted yesterday with complains of worsening swelling of legs and possible CHF exacerbation. We were asked to see her due to ongoing anemia and need for anticoagulation due to history of atrial fibrillation.  She used to be on Coumadin, currently is on Eliquis due to ongoing problems of anemia.  She has been evaluated for anemia at [facility] in 6/19, with unremarkable EGD and a colonoscopy. She was reevaluated by Dr. Odie Sera in [place] with EGD and colonoscopy, both unremarkable as per patient, plans to perform a capsule endoscopy as an outpatient on 01/12/2018.  Patient denies seeing blood in stool, black stools.  She used to be on oral iron, which was recently stopped and she was started on IV iron supplementation. She denies abdominal pain, difficulty swallowing, pain on swallowing. Reports that her bowel movements are light brown in color.   Past Medical History:  Diagnosis Date  . Acute blood loss anemia 08/02/2017  . Atrial fibrillation ([place])   . Chronic atrial fibrillation 10/15/2015  . Chronic diastolic CHF (congestive heart failure) ([place])   . Diabetes mellitus ([place]) 07/27/2017  . Edema   . Essential hypertension 10/15/2015  . GERD (gastroesophageal reflux disease) 07/27/2017  . GI bleed 07/24/2017  . Hyperlipidemia   . Pacemaker 10/15/2015  . Pneumonia of right lower lobe due to infectious organism ([place]) 08/02/2017  . Skin rash 08/02/2017  . Type 2 diabetes mellitus without complication ([place]) 7/82/4235    Past Surgical History:  Procedure Laterality Date  . ABDOMINAL HYSTERECTOMY  1995   Total  . AV FISTULA REPAIR    . CATARACT EXTRACTION    . PACEMAKER  INSERTION  10/30/2015   By Dr. Agustin Cree    Prior to Admission medications   Medication Sig Start Date End Date Taking? Authorizing Provider  acetaminophen (TYLENOL) 325 MG tablet Take 325 mg by mouth every 6 (six) hours as needed for mild pain or fever.    Yes [provider]  apixaban (ELIQUIS) 5 MG TABS tablet Take 10 mg by mouth every evening.    Yes [provider]  atorvastatin (LIPITOR) 80 MG tablet Take 80 mg by mouth at bedtime.  03/23/17  Yes [provider]  Cholecalciferol (VITAMIN D3) 2000 units capsule Take 6,000 Units by mouth daily.   Yes [provider]  diltiazem (CARDIZEM) 90 MG tablet Take 180 mg by mouth 3 (three) times daily.  04/24/17  Yes [provider]  fenofibrate 160 MG tablet Take 160 mg by mouth every morning.  03/23/17  Yes [provider]  furosemide (LASIX) 80 MG tablet Take 80 mg by mouth every morning.  12/23/17  Yes [provider]  LORazepam (ATIVAN) 0.5 MG tablet Take 0.5 mg by mouth as needed for anxiety.  09/02/17  Yes [provider]  omeprazole (PRILOSEC) 20 MG capsule Take 20 mg by mouth every morning.  03/23/17  Yes [provider]  PROAIR HFA 108 (90 Base) MCG/ACT inhaler Inhale 2 puffs into the lungs every 6 (six) hours as needed for wheezing or shortness of breath.  04/24/17  Yes [provider]    Current Facility-Administered Medications  Medication Dose Route Frequency Provider Last Rate Last Dose  . acetaminophen (TYLENOL) tablet 650 mg  650 mg  Oral Q6H PRN Lenore Cordia, MD   650 mg at 12/28/17 1010   Or  . acetaminophen (TYLENOL) suppository 650 mg  650 mg Rectal Q6H PRN Zada Finders R, MD      . albuterol (PROVENTIL) (2.5 MG/3ML) 0.083% nebulizer solution 2.5 mg  2.5 mg Inhalation Q6H PRN Zada Finders R, MD      . atorvastatin (LIPITOR) tablet 80 mg  80 mg Oral q1800 Lenore Cordia, MD   80 mg at 12/28/17 1658  . diltiazem (CARDIZEM) 100 mg in dextrose  5% 145mL (1 mg/mL) infusion  5-15 mg/hr Intravenous Titrated Janora Norlander, MD 12.5 mL/hr at 12/29/17 1017 12.5 mg/hr at 12/29/17 1017  . diltiazem (CARDIZEM) tablet 90 mg  90 mg Oral Q6H Donne Hazel, MD      . furosemide (LASIX) injection 40 mg  40 mg Intravenous BID Lenore Cordia, MD   40 mg at 12/29/17 0908  . guaiFENesin-dextromethorphan (ROBITUSSIN DM) 100-10 MG/5ML syrup 5 mL  5 mL Oral Q4H PRN Lenore Cordia, MD   5 mL at 12/28/17 1658  . insulin aspart (novoLOG) injection 0-9 Units  0-9 Units Subcutaneous TID WC Patel, Vishal R, MD      . metoprolol tartrate (LOPRESSOR) tablet 12.5 mg  12.5 mg Oral BID Donne Hazel, MD   12.5 mg at 12/29/17 0908  . pantoprazole (PROTONIX) EC tablet 40 mg  40 mg Oral Daily Lenore Cordia, MD   40 mg at 12/29/17 0908  . polyethylene glycol (MIRALAX / GLYCOLAX) packet 17 g  17 g Oral Daily Lenore Cordia, MD   17 g at 12/29/17 0909  . sodium chloride flush (NS) 0.9 % injection 3 mL  3 mL Intravenous Q12H Zada Finders R, MD   3 mL at 12/29/17 0003    Allergies as of 12/27/2017 - Review Complete 12/27/2017  Allergen Reaction Noted  . Iodine Hives, Other (See Comments), and Shortness Of Breath 10/15/2015  . Iodinated diagnostic agents  10/15/2015  . Penicillins  10/15/2015    Family History  Problem Relation Age of Onset  . Stroke Mother   . Heart disease Mother   . Diabetes Mother   . Hypertension Mother   . Hyperlipidemia Mother   . Heart attack Father   . Heart disease Father   . Diabetes Maternal Grandfather   . Hypertension Maternal Grandfather   . Heart attack Paternal Grandfather   . Heart disease Paternal Grandfather     Social History   Socioeconomic History  . Marital status: Widowed    Spouse name: Not on file  . Number of children: Not on file  . Years of education: Not on file  . Highest education level: Not on file  Occupational History  . Occupation: Retired  Scientific laboratory technician  . Financial resource strain: Not  on file  . Food insecurity:    Worry: Not on file    Inability: Not on file  . Transportation needs:    Medical: Not on file    Non-medical: Not on file  Tobacco Use  . Smoking status: Never Smoker  . Smokeless tobacco: Never Used  Substance and Sexual Activity  . Alcohol use: Never    Frequency: Never  . Drug use: Never  . Sexual activity: Not on file  Lifestyle  . Physical activity:    Days per week: Not on file    Minutes per session: Not on file  . Stress: Not on file  Relationships  . Social connections:    Talks on phone: Not on file    Gets together: Not on file    Attends religious service: Not on file    Active member of club or organization: Not on file    Attends meetings of clubs or organizations: Not on file    Relationship status: Not on file  . Intimate partner violence:    Fear of current or ex partner: Not on file    Emotionally abused: Not on file    Physically abused: Not on file    Forced sexual activity: Not on file  Other Topics Concern  . Not on file  Social History Narrative  . Not on file    Review of Systems: Positive for: GI: Described in detail in HPI.    Gen: Denies any fever, chills, rigors, night sweats, anorexia, fatigue, weakness, malaise, involuntary weight loss, and sleep disorder CV: Denies chest pain, angina, palpitations, syncope, orthopnea, PND, peripheral edema, and claudication. Resp: Denies dyspnea, cough, sputum, wheezing, coughing up blood. GU : Denies urinary burning, blood in urine, urinary frequency, urinary hesitancy, nocturnal urination, and urinary incontinence. MS: Massive swelling of bilateral lower extremities compatible with lymphedema, denies muscle weakness, cramps, atrophy.  Derm: Multiple ulcers in bilateral lower legs , denies rash, itching, oral ulcerations, hives. Psych: Denies depression, anxiety, memory loss, suicidal ideation, hallucinations,  and confusion. Heme: Denies bruising, bleeding, and enlarged  lymph nodes. Neuro:  Denies any headaches, dizziness, paresthesias. Endo:  DM, Denies any problems with thyroid, adrenal function.  Physical Exam: Vital signs in last 24 hours: Temp:  [98.4 F (36.9 C)-98.7 F (37.1 C)] 98.7 F (37.1 C) (11/07 1335) Pulse Rate:  [89-107] 92 (11/07 1335) Resp:  [17-18] 18 (11/07 0557) BP: (106-118)/(62-74) 108/72 (11/07 1335) SpO2:  [96 %-100 %] 100 % (11/07 1335) Weight:  [114.1 kg] 114.1 kg (11/07 0230) Last BM Date: 12/26/17  General:   Obese, cooperative, pleasant, appears her stated age head:  Normocephalic and atraumatic. Eyes:  Sclera clear, no icterus.   Mild pallor Ears:  Normal auditory acuity. Nose:  No deformity, discharge,  or lesions. Mouth:  No deformity or lesions.  Oropharynx pink & moist. Neck: Jugular venous distention Lungs:  Clear throughout to auscultation.   No wheezes, crackles, or rhonchi. No acute distress. Heart:  Irregular rate and rhythm Extremities: Massive swelling bilateral lower extremities compatible with lymphedema. Neurologic:  Alert and  oriented x4;  grossly normal neurologically. Skin: Ulcers in lower extremities covered with dressing and bandages Psych:  Alert and cooperative. Normal mood and affect. Abdomen:  Soft, nontender and nondistended. No masses, hepatosplenomegaly or hernias noted. Normal bowel sounds, without guarding, and without rebound.         Lab Results: Recent Labs    12/27/17 1258 12/28/17 0434 12/28/17 1419 12/29/17 0358  WBC 6.6 6.8  --  5.8  HGB 9.0* 7.8* 8.0* 7.7*  HCT 31.0* 26.4* 27.4* 26.1*  PLT 224 235  --  216   BMET Recent Labs    12/27/17 1258 12/28/17 0434 12/29/17 0358  NA 139 139 138  K 3.6 3.1* 4.1  CL 102 106 107  CO2 26 28 23   GLUCOSE 127* 107* 97  BUN 18 14 16   CREATININE 1.14* 1.19* 1.21*  CALCIUM 9.4 9.1 9.0   LFT Recent Labs    12/29/17 0358  PROT 5.4*  ALBUMIN 2.5*  AST 31  ALT 7  ALKPHOS 49  BILITOT 1.5*   PT/INR No  results for  input(s): LABPROT, INR in the last 72 hours.  Studies/Results: No results found.  Impression: Ongoing anemia with unremarkable endoscopy and colonoscopy performed twice within the last few months. Hemoglobin 7.7 today(9 on admission, not required blood transfusions), normal BUN/creatinine ratio Need for chronic anticoagulation due to history of atrial fibrillation as patient remains at high risk of stroke  Plan: Clear liquid diet today, n.p.o. postmidnight, plan to proceed with a capsule endoscopy to rule out small bowel bleed   LOS: 2 days   Ronnette Juniper, MD 12/29/2017, 2:13 PM  Pager [phone] If no answer or after 5 PM call [phone]

## 2017-12-29 NOTE — Consult Note (Addendum)
Cardiology Consultation:   Patient ID: Latasha Guzman; [id number]; 1945/06/09   Admit date: 12/27/2017 Date of Consult: 12/29/2017  Primary Care Provider: Nicoletta Dress, MD Primary Cardiologist: Jenne Campus, MD Primary Electrophysiologist:  None   Patient Profile:   Latasha Guzman is a 72 y.o. female with a hx of chronic Afib, DM, HTN, D-CHF, chronic anticoag, CHA2DS2-VASc = 4  (age x 1, CHF, HTN, DM), GERD, anemia, who is being seen today for the evaluation of risks vs benefits of anticoag at the request of Dr Wyline Copas.  She has had problems w/ anemia since starting on coumadin. No cause found on endoscopy, colonoscopy x 2, bone marrow bx.   She was admitted to [facility] 06/02-06/01/2018 for anemia, presumed GIB.  EGD and colonoscopy performed w/out a source found. Capsule endoscopy not done yet.  Sees hematology in [place], Dr Bobby Rumpf. She saw Dr Bobby Rumpf in September, blood counts low, admitted to [facility]. Had transfusions, Xarelto recommended. It was started after discharge.   Saw Dr Delena Bali later after d/c, someone had told her that Eliquis had a lower bleeding risk than Xarelto. Dr Delena Bali changed her to Eliquis.   Problems w/ Iron, Dr Lyda Jester recommended she stop it. Dr Bobby Rumpf has been doing iron infusions. Her blood counts have stabillized w/ Hgb approx 8. No black tarry stools on IV iron, she is not aware of any blood loss.   History of Present Illness:   Latasha Guzman was seen by Dr Agustin Cree 11/05. He reviewed her recent history. Her HR was 153 in the office 11/05, rapid Afib. He sent her to the ER for admission. H&H 9.0/31.0 on admission, dropped to 7.8/26.4 and has come back up to her baseline of approximately 8 without transfusion.  Dr Wyline Copas has been managing her rapid atrial fib, HR has gradually improved. He requests cardiology input on anticoagulation options.   Latasha Guzman does not feel that she has been losing any blood.  Her mother died from a stroke, she  is very concerned about that.  Compliance with a low-sodium diabetic diet is poor.  When Dr. Agustin Cree saw her, she did not know she was supposed to avoid salt and was drinking a lot of fluids.  Dr. Sherrian Divers has been diuresing her.  Her intake/output is incomplete, but her weight has dropped 15 pounds in 3 days.  Her legs are chronically swollen, she has some weeping wounds.  These are dressed and are not disturbed.  She has been seen by the wound care nurse.  She is rarely aware of the atrial fibrillation, will sometimes be a little fluttering in her chest.   Past Medical History:  Diagnosis Date  . Acute blood loss anemia 08/02/2017  . Atrial fibrillation ([place])   . Chronic atrial fibrillation 10/15/2015  . Chronic diastolic CHF (congestive heart failure) ([place])   . Diabetes mellitus ([place]) 07/27/2017  . Edema   . Essential hypertension 10/15/2015  . GERD (gastroesophageal reflux disease) 07/27/2017  . GI bleed 07/24/2017  . Hyperlipidemia   . Pacemaker 10/15/2015  . Pneumonia of right lower lobe due to infectious organism ([place]) 08/02/2017  . Skin rash 08/02/2017  . Type 2 diabetes mellitus without complication ([place]) 5/73/2202    Past Surgical History:  Procedure Laterality Date  . ABDOMINAL HYSTERECTOMY  1995   Total  . AV FISTULA REPAIR    . CATARACT EXTRACTION    . PACEMAKER INSERTION  10/30/2015   By Dr. Agustin Cree     Prior to Admission  medications   Medication Sig Start Date End Date Taking? Authorizing Provider  acetaminophen (TYLENOL) 325 MG tablet Take 325 mg by mouth every 6 (six) hours as needed for mild pain or fever.    Yes [provider]  apixaban (ELIQUIS) 5 MG TABS tablet Take 10 mg by mouth every evening.    Yes [provider]  atorvastatin (LIPITOR) 80 MG tablet Take 80 mg by mouth at bedtime.  03/23/17  Yes [provider]  Cholecalciferol (VITAMIN D3) 2000 units capsule Take 6,000 Units by mouth daily.   Yes [provider]  diltiazem  (CARDIZEM) 90 MG tablet Take 180 mg by mouth 3 (three) times daily.  04/24/17  Yes [provider]  fenofibrate 160 MG tablet Take 160 mg by mouth every morning.  03/23/17  Yes [provider]  furosemide (LASIX) 80 MG tablet Take 80 mg by mouth every morning.  12/23/17  Yes [provider]  LORazepam (ATIVAN) 0.5 MG tablet Take 0.5 mg by mouth as needed for anxiety.  09/02/17  Yes [provider]  omeprazole (PRILOSEC) 20 MG capsule Take 20 mg by mouth every morning.  03/23/17  Yes [provider]  PROAIR HFA 108 (90 Base) MCG/ACT inhaler Inhale 2 puffs into the lungs every 6 (six) hours as needed for wheezing or shortness of breath.  04/24/17  Yes [provider]    Inpatient Medications: Scheduled Meds: . atorvastatin  80 mg Oral q1800  . diltiazem  90 mg Oral Q6H  . furosemide  40 mg Intravenous BID  . insulin aspart  0-9 Units Subcutaneous TID WC  . metoprolol tartrate  12.5 mg Oral BID  . pantoprazole  40 mg Oral Daily  . polyethylene glycol  17 g Oral Daily  . sodium chloride flush  3 mL Intravenous Q12H   Continuous Infusions: . dilTIAZem HCl-Dextrose 12.5 mg/hr (12/29/17 1017)   PRN Meds: acetaminophen **OR** acetaminophen, albuterol, guaiFENesin-dextromethorphan  Allergies:    Allergies  Allergen Reactions  . Iodine Hives, Other (See Comments) and Shortness Of Breath    Throat swells  . Iodinated Diagnostic Agents   . Penicillins Hives    Has patient had a PCN reaction causing immediate rash, facial/tongue/throat swelling, SOB or lightheadedness with hypotension: Yes Has patient had a PCN reaction causing severe rash involving mucus membranes or skin necrosis: No Has patient had a PCN reaction that required hospitalization: No Has patient had a PCN reaction occurring within the last 10 years: No If all of the above answers are "NO", then may proceed with Cephalosporin use.     Social History:   Social History    Socioeconomic History  . Marital status: Widowed    Spouse name: Not on file  . Number of children: Not on file  . Years of education: Not on file  . Highest education level: Not on file  Occupational History  . Occupation: Retired  Scientific laboratory technician  . Financial resource strain: Not on file  . Food insecurity:    Worry: Not on file    Inability: Not on file  . Transportation needs:    Medical: Not on file    Non-medical: Not on file  Tobacco Use  . Smoking status: Never Smoker  . Smokeless tobacco: Never Used  Substance and Sexual Activity  . Alcohol use: Never    Frequency: Never  . Drug use: Never  . Sexual activity: Not on file  Lifestyle  . Physical activity:    Days  per week: Not on file    Minutes per session: Not on file  . Stress: Not on file  Relationships  . Social connections:    Talks on phone: Not on file    Gets together: Not on file    Attends religious service: Not on file    Active member of club or organization: Not on file    Attends meetings of clubs or organizations: Not on file    Relationship status: Not on file  . Intimate partner violence:    Fear of current or ex partner: Not on file    Emotionally abused: Not on file    Physically abused: Not on file    Forced sexual activity: Not on file  Other Topics Concern  . Not on file  Social History Narrative  . Not on file    Family History:   Family History  Problem Relation Age of Onset  . Stroke Mother   . Heart disease Mother   . Diabetes Mother   . Hypertension Mother   . Hyperlipidemia Mother   . Heart attack Father   . Heart disease Father   . Diabetes Maternal Grandfather   . Hypertension Maternal Grandfather   . Heart attack Paternal Grandfather   . Heart disease Paternal Grandfather    Family Status:  Family Status  Relation Name Status  . Mother  (Not Specified)  . Father  (Not Specified)  . MGF  (Not Specified)  . PGF  (Not Specified)    ROS:  Please see the  history of present illness.  All other ROS reviewed and negative.     Physical Exam/Data:   Vitals:   12/29/17 0230 12/29/17 0557 12/29/17 0817 12/29/17 1335  BP:  106/69 113/73 108/72  Pulse:  94 89 92  Resp:  18    Temp:   98.4 F (36.9 C) 98.7 F (37.1 C)  TempSrc:   Oral Oral  SpO2:  96% 97% 100%  Weight: 114.1 kg       Intake/Output Summary (Last 24 hours) at 12/29/2017 1343 Last data filed at 12/29/2017 1211 Gross per 24 hour  Intake 595 ml  Output 925 ml  Net -330 ml   Filed Weights   12/27/17 1238 12/28/17 0240 12/29/17 0230  Weight: 120.2 kg 113.4 kg 114.1 kg   Body mass index is 42.51 kg/m.  General: Chronically ill appearing, morbidly obese, elderly female, in no acute distress HEENT: normal  Lymph: no adenopathy Neck: JVD 8-9 cm Endocrine:  No thryomegaly Vascular: No carotid bruits; upper extremity pulses 2+, pedal pulses difficult to palpate due to edema Cardiac:  normal S1, S2; irregular rate and rhythm, 2/6 murmur  Lungs:  clear to auscultation bilaterally, no wheezing, rhonchi or rales  Abd: soft, nontender, no hepatomegaly  Ext: no edema Musculoskeletal:  No deformities, BUE and BLE strength normal and equal Skin: warm and dry  Neuro:  CNs 2-12 intact, no focal abnormalities noted Psych:  Normal affect   EKG:  The EKG was personally reviewed and demonstrates: 11/5 ECG is atrial fibrillation, heart rate 156, occasional PVCs Telemetry:  Telemetry was personally reviewed and demonstrates: Atrial fibrillation, rate generally greater than 100, but better controlled, PVCs and occasional bigeminy  Relevant CV Studies:  ECHO: performed, not read yet  Laboratory Data:  Chemistry Recent Labs  Lab 12/27/17 1258 12/28/17 0434 12/29/17 0358  NA 139 139 138  K 3.6 3.1* 4.1  CL 102 106 107  CO2 26 28 23  GLUCOSE 127* 107* 97  BUN 18 14 16   CREATININE 1.14* 1.19* 1.21*  CALCIUM 9.4 9.1 9.0  GFRNONAA 47* 45* 44*  GFRAA 55* 52* 51*  ANIONGAP 11 5 8      Lab Results  Component Value Date   ALT 7 12/29/2017   AST 31 12/29/2017   ALKPHOS 49 12/29/2017   BILITOT 1.5 (H) 12/29/2017   Hematology Recent Labs  Lab 12/27/17 1258 12/28/17 0434 12/28/17 1419 12/29/17 0358  WBC 6.6 6.8  --  5.8  RBC 3.31* 2.90*  --  2.86*  HGB 9.0* 7.8* 8.0* 7.7*  HCT 31.0* 26.4* 27.4* 26.1*  MCV 93.7 91.0  --  91.3  MCH 27.2 26.9  --  26.9  MCHC 29.0* 29.5*  --  29.5*  RDW 14.4 14.5  --  14.3  PLT 224 235  --  216   Cardiac Enzymes Recent Labs  Lab 12/27/17 1258 12/28/17 0434  TROPONINI 0.03* <0.03     BNP Recent Labs  Lab 12/27/17 1258  BNP 540.2*    HgbA1c: Lab Results  Component Value Date   HGBA1C 4.2 (L) 12/28/2017   Magnesium:  Magnesium  Date Value Ref Range Status  12/28/2017 1.9 1.7 - 2.4 mg/dL Final    Comment:    Performed at [facility], [place] [address], [place], [place] [address]     Radiology/Studies:  Dg Chest 2 View  Result Date: 12/27/2017 CLINICAL DATA:  Acute shortness of breath EXAM: CHEST - 2 VIEW COMPARISON:  None. FINDINGS: Cardiomegaly and pulmonary vascular congestion noted. A LEFT pacemaker is noted. Trace bilateral pleural effusions are present There is no evidence of focal airspace disease, pulmonary edema, suspicious pulmonary nodule/mass or pneumothorax. No acute bony abnormalities are identified. IMPRESSION: Cardiomegaly with pulmonary vascular congestion and trace bilateral pleural effusions. Electronically Signed   By: Margarette Canada M.D.   On: 12/27/2017 13:20    Assessment and Plan:   1.  Risks/benefits of anticoagulation: - Have contacted Dr. Jaclyn Shaggy office to ask for his last note -According to the patient, she has had an extensive GI work-up without cause found, she is to have a capsule endoscopy today -According to the patient, she had been stable on iron infusions and her current blood counts are her baseline. -Review the results of the capsule endoscopy, when available - She has  significant concerns about having a stroke, she may wish to continue anticoagulation even if she will continue to have bleeding problems.  2.  Acute on chronic diastolic CHF: -She initially got a dose of Lasix 80 mg in the emergency room.  Since then, she has been on Lasix 40 mg IV twice daily, has had 3 doses of that so far. -Intake/output are hopefully incomplete because they are positive. -However, her weight is down 15 pounds - Continue diuresis per Dr. Wyline Copas, she still does have some volume overload by exam, but I am sure it is much improved  3.  Chronic atrial fibrillation, rapid ventricular response - Her elevated heart rate may have been at least partly due to volume overload and anemia. - Prior to admission, she was on Cardizem 60 mg, 3 tabs 3 times daily.  Otherwise, per IM Principal Problem:   Atrial fibrillation with RVR ([place]) Active Problems:   Diabetes mellitus ([place])   Essential hypertension   History of GI bleed   Chronic diastolic CHF (congestive heart failure) ([facility])   Lymphedema of both lower extremities   Hyperlipidemia associated with type 2  diabetes mellitus ([place])     For questions or updates, please contact [place] Please consult [email or web address] for contact info under Cardiology/STEMI.   Signed, Rosaria Ferries, PA-C  12/29/2017 1:43 PM  I have examined the patient and reviewed assessment and plan and discussed with patient.  Agree with above as stated.  Hbg stable.  No known bleeding sourcce found.  If capsule endoscopy negative, would start Eliquis for stroke prevention in the setting of AFib.    Would like to get bone marrow results as well.  If anemia is from a known bone marrow issue, that would also be reassuring in terms of starting anticoagulation.   Larae Grooms

## 2017-12-29 NOTE — Evaluation (Signed)
Physical Therapy Evaluation Patient Details Name: Latasha Guzman MRN: [id number] DOB: 02-11-1946 Today's Date: 12/29/2017   History of Present Illness  Pt is a 72 y/o female admitted secondary to worsening LE swelling. Found to have a fib with RVR as well and anemia of unknown source. Pt likley for capsule study on 11/8. PMH includes DM, HTN, a fib, dCHF and s/p pacemaker.   Clinical Impression  Pt admitted secondary to problem above with deficits below. Mobility limited to stand pivot transfer to [facility] and back to bed secondary to increased pain. Required min to mod A for mobility this session. Pt lives alone and feel pt will need increased assist at d/c. Will continue to follow acutely to maximize functional mobility independence and safety.     Follow Up Recommendations SNF;Supervision for mobility/OOB    Equipment Recommendations  None recommended by PT    Recommendations for Other Services       Precautions / Restrictions Precautions Precautions: Fall Restrictions Weight Bearing Restrictions: No      Mobility  Bed Mobility Overal bed mobility: Needs Assistance Bed Mobility: Supine to Sit;Sit to Supine     Supine to sit: Mod assist Sit to supine: Mod assist   General bed mobility comments: Mod A for LE assist and trunk elevation. increased time to come to EOB secondary to back spasms. Mod A for LE assist for return to supine.   Transfers Overall transfer level: Needs assistance Equipment used: Rolling walker (2 wheeled) Transfers: Sit to/from [facility] Sit to Stand: Min assist;From elevated surface Stand pivot transfers: Min assist       General transfer comment: Required extended time in sitting prior to standing secondary to back spasm. Min A from elevated surface to stand. Required min A and extended time to perform stand pivot to [facility] and back to bed. Further mobility limited secondary to increased pain.   Ambulation/Gait              General Gait Details: Deferred secondary to back pain.   Stairs            Wheelchair Mobility    Modified Rankin (Stroke Patients Only)       Balance Overall balance assessment: Needs assistance Sitting-balance support: No upper extremity supported;Feet supported Sitting balance-Leahy Scale: Fair     Standing balance support: Bilateral upper extremity supported;During functional activity Standing balance-Leahy Scale: Poor Standing balance comment: Reliant on BUE support                              Pertinent Vitals/Pain Pain Assessment: 0-10 Pain Score: 10-Worst pain ever Pain Location: back  Pain Descriptors / Indicators: Spasm;Sharp Pain Intervention(s): Limited activity within patient's tolerance;Monitored during session;Repositioned    Home Living Family/patient expects to be discharged to:: Private residence Living Arrangements: Alone Available Help at Discharge: Personal care attendant;Friend(s);Available PRN/intermittently(PCA comes 3X/week and nurse comes once every day ) Type of Home: Apartment Home Access: Level entry     Home Layout: One level Home Equipment: Walker - 2 wheels;Bedside commode      Prior Function Level of Independence: Needs assistance   Gait / Transfers Assistance Needed: Uses RW for ambulation   ADL's / Homemaking Assistance Needed: Requires assist for IADL tasks such as cooking, cleaning, etc.         Hand Dominance        Extremity/Trunk Assessment   Upper Extremity Assessment Upper Extremity Assessment: Defer  to OT evaluation    Lower Extremity Assessment Lower Extremity Assessment: RLE deficits/detail;LLE deficits/detail RLE Deficits / Details: Increased swelling noted. Knee wrapped. Limited ROM secondary to swelling and pain.  LLE Deficits / Details: Swelling noted in LLE. Sores throughout LLE. Limited ROM secondary to pain and swelling.     Cervical / Trunk Assessment Cervical / Trunk Assessment:  Other exceptions Cervical / Trunk Exceptions: reporting "back spasms" with mobility.   Communication   Communication: No difficulties  Cognition Arousal/Alertness: Awake/alert Behavior During Therapy: WFL for tasks assessed/performed Overall Cognitive Status: Within Functional Limits for tasks assessed                                        General Comments General comments (skin integrity, edema, etc.): HR ranging from upper 90s to mid 110s.     Exercises     Assessment/Plan    PT Assessment Patient needs continued PT services  PT Problem List Decreased strength;Decreased knowledge of use of DME;Decreased activity tolerance;Decreased balance;Decreased mobility;Decreased knowledge of precautions;Pain       PT Treatment Interventions DME instruction;Gait training;Functional mobility training;Therapeutic activities;Therapeutic exercise;Balance training;Patient/family education    PT Goals (Current goals can be found in the Care Plan section)  Acute Rehab PT Goals Patient Stated Goal: to decrease back pain  PT Goal Formulation: With patient Time For Goal Achievement: 01/12/18 Potential to Achieve Goals: Good    Frequency Min 2X/week   Barriers to discharge Decreased caregiver support      Co-evaluation               AM-PAC PT "6 Clicks" Daily Activity  Outcome Measure Difficulty turning over in bed (including adjusting bedclothes, sheets and blankets)?: A Lot Difficulty moving from lying on back to sitting on the side of the bed? : Unable Difficulty sitting down on and standing up from a chair with arms (e.g., wheelchair, bedside commode, etc,.)?: Unable Help needed moving to and from a bed to chair (including a wheelchair)?: A Little Help needed walking in hospital room?: A Little Help needed climbing 3-5 steps with a railing? : A Lot 6 Click Score: 12    End of Session Equipment Utilized During Treatment: Gait belt Activity Tolerance: Patient  limited by pain Patient left: in bed;with call bell/phone within reach Nurse Communication: Mobility status PT Visit Diagnosis: Other abnormalities of gait and mobility (R26.89);Unsteadiness on feet (R26.81);Muscle weakness (generalized) (M62.81);Pain Pain - part of body: (back )    Time: 3149-7026 PT Time Calculation (min) (ACUTE ONLY): 24 min   Charges:   PT Evaluation $PT Eval Moderate Complexity: 1 Mod PT Treatments $Therapeutic Activity: 8-22 mins        Leighton Ruff, PT, DPT  Acute Rehabilitation Services  Pager: [phone] Office: [phone]   Rudean Hitt 12/29/2017, 4:51 PM

## 2017-12-29 NOTE — Progress Notes (Signed)
PROGRESS NOTE    Latasha Guzman  TKP:[id number] DOB: [birth year] DOA: 12/27/2017 PCP: Nicoletta Dress, MD    Brief Narrative:  72 y.o. female with medical history significant for atrial fibrillation on Eliquis, s/p PPM, chronic diastolic CHF, type 2 diabetes, hyperlipidemia, hypertension, and chronic lymphedema of both lower extremities who was transferred to [facility] for atrial fibrillation with RVR and worsening edema.  She initially presented to her cardiologist's (Dr. Drue Dun) office to reestablish care.  She was noted to be in atrial fibrillation with accelerated ventricular rate and have significant swelling of both of her legs.  She was therefore sent to the [place] emergency department.  Patient says that she has chronic lymphedema both legs but has noticed progressive swelling recently.  She has been taking Lasix 80 mg once daily.  It seems that she has been drinking a lot of fluids due to dry mouth.  She reports occasional palpitations.  She otherwise denies chest pain, dyspnea, orthopnea, abdominal pain, dysuria, or any obvious bleeding including hemoptysis, hematemesis, hematuria, hematochezia, or melena.  In the ED, Initial vitals BP 122/68, pulse 115, RR 23, temp 98.7, SPO2 100% on 2L supplemental O2 Rafael Gonzalez.  Labs notable for WBC 6.6, hemoglobin 9.0, platelets 224.  Troponin I 0.03, BNP 540.2.  EKG showed atrial fibrillation with rapid ventricular response and PVCs.  2 view chest x-ray showed cardiomegaly with pulmonary vascular congestion.  She was started on a diltiazem infusion and given IV Lasix 80 mg once and transferred to [facility] for further care.  Assessment & Plan:   Principal Problem:   Atrial fibrillation with RVR (HCC) Active Problems:   Diabetes mellitus (HCC)   Essential hypertension   History of GI bleed   Chronic diastolic CHF (congestive heart failure) (HCC)   Lymphedema of both lower extremities   Hyperlipidemia associated  with type 2 diabetes mellitus (HCC)  Atrial fibrillation with RVR: -Started and continued on cardizem gtt. -Metoprolol added overnight with improvement in HR -Will transition off IV to PO cardizem -CHADS of 4. Started on eliquis overnight, however hgb down to 7.8, repeat hgb this AM of 7.7 -Holding further eliquis.  -Appreciate input by Cardiology regarding anticoagulation. If capsule study unremarkable, then possibility for resuming Eliquis  Chronic diastolic CHF: BLE edema of both her legs, a lot of which is likely secondary to her chronic lymphedema.   -Chest x-ray with findings worrisome for pulmonary vascular congestion. -Given IV Lasix 80 mg in the ED -Continue with IV Lasix 40 mg BID -continuing strict I/o and daily wts  History of GI bleed with acute blood loss anemia: -Outside records from [place] reviewed on EMR -Patient had been seen by Dr. Lyda Jester for concerns of GI bleed -Pt is s/p EGD and colon on 11/04/17 with findings of mild diverticular disease and external hemorrhoids as well as hiatal hernia, otherwise unremarkable studies -Per Dr. Lyda Jester, consideration for capsule study if anemia worsens -Hgb remains stable at 7.7 this AM -Appreciate input by GI. Plan for capsule study 11/8 -Repeat CBC in AM  Type 2 diabetes: -Continue SSI coverage -Stable at present  Hyperlipidemia: -Continue with Atorvastatin as tolerated -Remains stable at this time  DVT prophylaxis: SCD's Code Status: Full Family Communication: Pt in room, family not at bedside Disposition Plan: Uncertain at this time  Consultants:   Cardiology  Procedures:     Antimicrobials: Anti-infectives (From admission, onward)   None      Subjective: No chest pain or sob  at this time  Objective: Vitals:   12/29/17 0230 12/29/17 0557 12/29/17 0817 12/29/17 1335  BP:  106/69 113/73 108/72  Pulse:  94 89 92  Resp:  18    Temp:   98.4 F (36.9 C) 98.7 F (37.1 C)  TempSrc:    Oral Oral  SpO2:  96% 97% 100%  Weight: 114.1 kg       Intake/Output Summary (Last 24 hours) at 12/29/2017 1448 Last data filed at 12/29/2017 1211 Gross per 24 hour  Intake 595 ml  Output 925 ml  Net -330 ml   Filed Weights   12/27/17 1238 12/28/17 0240 12/29/17 0230  Weight: 120.2 kg 113.4 kg 114.1 kg    Examination: General exam: Awake, laying in bed, in nad Respiratory system: Normal respiratory effort, no wheezing Cardiovascular system: irregularly irregular, s1, s2 Gastrointestinal system: Soft, nondistended, positive BS Central nervous system: CN2-12 grossly intact, strength intact Extremities: Perfused, no clubbing, BLE edema Skin: Normal skin turgor, no notable skin lesions seen Psychiatry: Mood normal // no visual hallucinations   Data Reviewed: I have personally reviewed following labs and imaging studies  CBC: Recent Labs  Lab 12/27/17 1258 12/28/17 0434 12/28/17 1419 12/29/17 0358  WBC 6.6 6.8  --  5.8  NEUTROABS 4.9  --   --   --   HGB 9.0* 7.8* 8.0* 7.7*  HCT 31.0* 26.4* 27.4* 26.1*  MCV 93.7 91.0  --  91.3  PLT 224 235  --  354   Basic Metabolic Panel: Recent Labs  Lab 12/27/17 1258 12/28/17 0434 12/29/17 0358  NA 139 139 138  K 3.6 3.1* 4.1  CL 102 106 107  CO2 26 28 23   GLUCOSE 127* 107* 97  BUN 18 14 16   CREATININE 1.14* 1.19* 1.21*  CALCIUM 9.4 9.1 9.0  MG  --  1.9  --    GFR: Estimated Creatinine Clearance: 53.3 mL/min (A) (by C-G formula based on SCr of 1.21 mg/dL (H)). Liver Function Tests: Recent Labs  Lab 12/29/17 0358  AST 31  ALT 7  ALKPHOS 49  BILITOT 1.5*  PROT 5.4*  ALBUMIN 2.5*   No results for input(s): LIPASE, AMYLASE in the last 168 hours. No results for input(s): AMMONIA in the last 168 hours. Coagulation Profile: No results for input(s): INR, PROTIME in the last 168 hours. Cardiac Enzymes: Recent Labs  Lab 12/27/17 1258 12/28/17 0434  TROPONINI 0.03* <0.03   BNP (last 3 results) No results for  input(s): PROBNP in the last 8760 hours. HbA1C: Recent Labs    12/28/17 0434  HGBA1C 4.2*   CBG: Recent Labs  Lab 12/28/17 0751 12/28/17 1212 12/28/17 1621 12/28/17 2217 12/29/17 1202  GLUCAP 102* 122* 112* 110* 112*   Lipid Profile: No results for input(s): CHOL, HDL, LDLCALC, TRIG, CHOLHDL, LDLDIRECT in the last 72 hours. Thyroid Function Tests: No results for input(s): TSH, T4TOTAL, FREET4, T3FREE, THYROIDAB in the last 72 hours. Anemia Panel: No results for input(s): VITAMINB12, FOLATE, FERRITIN, TIBC, IRON, RETICCTPCT in the last 72 hours. Sepsis Labs: No results for input(s): PROCALCITON, LATICACIDVEN in the last 168 hours.  No results found for this or any previous visit (from the past 240 hour(s)).   Radiology Studies: No results found.  Scheduled Meds: . atorvastatin  80 mg Oral q1800  . diltiazem  90 mg Oral Q6H  . furosemide  40 mg Intravenous BID  . insulin aspart  0-9 Units Subcutaneous TID WC  . metoprolol tartrate  12.5 mg Oral BID  .  pantoprazole  40 mg Oral Daily  . polyethylene glycol  17 g Oral BID  . sodium chloride flush  3 mL Intravenous Q12H   Continuous Infusions: . dilTIAZem HCl-Dextrose 12.5 mg/hr (12/29/17 1017)     LOS: 2 days   Marylu Lund, MD [facility] Pager On Amion  If 7PM-7AM, please contact night-coverage 12/29/2017, 2:48 PM

## 2017-12-29 NOTE — Progress Notes (Signed)
Pacemaker undersensing noted on telemetry. Pacemaker interrogation done with bipolar R waves sensed at 34mV and sensitivity programmed at 30mV. Bipolar R waves 79mV.  Ventricular sensitivity reprogrammed to unipolar with programmed sensitivity of 2.76mV.   Will arrange outpatient follow up with Dr Curt Bears to establish for pacemaker follow up.  Chanetta Marshall, NP 12/29/2017 2:34 PM

## 2017-12-30 ENCOUNTER — Encounter (HOSPITAL_COMMUNITY)
Admission: EM | Disposition: A | Discharge status: Home Health | Payer: Self-pay | Source: Home / Self Care | Attending: Internal Medicine

## 2017-12-30 DIAGNOSIS — Z8719 Personal history of other diseases of the digestive system: Secondary | ICD-10-CM

## 2017-12-30 DIAGNOSIS — E0801 Diabetes mellitus due to underlying condition with hyperosmolarity with coma: Secondary | ICD-10-CM

## 2017-12-30 HISTORY — PX: GIVENS CAPSULE STUDY: SHX5432

## 2017-12-30 LAB — CBC
HCT: 25.9 % — ABNORMAL LOW (ref 36.0–46.0)
Hemoglobin: 7.7 g/dL — ABNORMAL LOW (ref 12.0–15.0)
MCH: 27.2 pg (ref 26.0–34.0)
MCHC: 29.7 g/dL — AB (ref 30.0–36.0)
MCV: 91.5 fL (ref 80.0–100.0)
PLATELETS: 226 10*3/uL (ref 150–400)
RBC: 2.83 MIL/uL — ABNORMAL LOW (ref 3.87–5.11)
RDW: 14.3 % (ref 11.5–15.5)
WBC: 5.8 10*3/uL (ref 4.0–10.5)
nRBC: 0 % (ref 0.0–0.2)

## 2017-12-30 LAB — BASIC METABOLIC PANEL
Anion gap: 6 (ref 5–15)
BUN: 12 mg/dL (ref 8–23)
CALCIUM: 9.2 mg/dL (ref 8.9–10.3)
CO2: 28 mmol/L (ref 22–32)
CREATININE: 1.4 mg/dL — AB (ref 0.44–1.00)
Chloride: 105 mmol/L (ref 98–111)
GFR calc Af Amer: 43 mL/min — ABNORMAL LOW (ref >60)
GFR, EST NON AFRICAN AMERICAN: 37 mL/min — AB (ref >60)
Glucose, Bld: 106 mg/dL — ABNORMAL HIGH (ref 70–99)
Potassium: 3.6 mmol/L (ref 3.5–5.1)
SODIUM: 139 mmol/L (ref 135–145)

## 2017-12-30 LAB — GLUCOSE, CAPILLARY
Glucose-Capillary: 90 mg/dL (ref 70–99)
Glucose-Capillary: 85 mg/dL (ref 70–99)
Glucose-Capillary: 93 mg/dL (ref 70–99)
Glucose-Capillary: 133 mg/dL — ABNORMAL HIGH (ref 70–99)

## 2017-12-30 SURGERY — IMAGING PROCEDURE, GI TRACT, INTRALUMINAL, VIA CAPSULE

## 2017-12-30 MED ORDER — FUROSEMIDE 80 MG PO TABS
80.0000 mg | ORAL_TABLET | Freq: Every day | ORAL | Status: DC
  Administered 2017-12-31 – 2018-01-01 (×2): 80 mg via ORAL
  Filled 2017-12-30 (×2): qty 1

## 2017-12-30 NOTE — Progress Notes (Signed)
Patient swallowed givens capsule with no issues. Nurse and patient informed of instructions of today. Instruction sheet left at bedside.

## 2017-12-30 NOTE — Progress Notes (Signed)
Small bowel pillcam swallowed in am, results will be available tomorrow.  Latasha Juniper, MD

## 2017-12-30 NOTE — Progress Notes (Signed)
Progress Note  Patient Name: MATTHEW PAIS Date of Encounter: 12/30/2017  Primary Cardiologist: Jenne Campus, MD   Subjective   Feels ok.  Swallowed capsule this AM.   Inpatient Medications    Scheduled Meds: . atorvastatin  80 mg Oral q1800  . diltiazem  90 mg Oral Q6H  . [START ON 12/31/2017] furosemide  80 mg Oral Daily  . insulin aspart  0-9 Units Subcutaneous TID WC  . metoprolol tartrate  12.5 mg Oral BID  . pantoprazole  40 mg Oral Daily  . polyethylene glycol  17 g Oral BID  . sodium chloride flush  3 mL Intravenous Q12H   Continuous Infusions:  PRN Meds: acetaminophen **OR** acetaminophen, albuterol, guaiFENesin-dextromethorphan   Vital Signs    Vitals:   12/30/17 0010 12/30/17 0424 12/30/17 0700 12/30/17 1215  BP: 106/65 112/62  133/88  Pulse: 81 87  (!) 110  Resp:      Temp: 98.3 F (36.8 C) 98.1 F (36.7 C)    TempSrc: Oral Oral    SpO2: 94% 94%    Weight:  118.1 kg 112.3 kg     Intake/Output Summary (Last 24 hours) at 12/30/2017 1229 Last data filed at 12/30/2017 1215 Gross per 24 hour  Intake 240 ml  Output 1275 ml  Net -1035 ml   Filed Weights   12/29/17 0230 12/30/17 0424 12/30/17 0700  Weight: 114.1 kg 118.1 kg 112.3 kg    Telemetry    AFib - Personally Reviewed  ECG      Physical Exam   GEN: No acute distress.   Neck: No JVD Cardiac: irregularly irregular, no murmurs, rubs, or gallops.  Respiratory: Clear to auscultation bilaterally. GI: Soft, nontender, non-distended  MS: No edema; No deformity. Neuro:  Nonfocal  Psych: Normal affect   Labs    Chemistry Recent Labs  Lab 12/28/17 0434 12/29/17 0358 12/30/17 0317  NA 139 138 139  K 3.1* 4.1 3.6  CL 106 107 105  CO2 28 23 28   GLUCOSE 107* 97 106*  BUN 14 16 12   CREATININE 1.19* 1.21* 1.40*  CALCIUM 9.1 9.0 9.2  PROT  --  5.4*  --   ALBUMIN  --  2.5*  --   AST  --  31  --   ALT  --  7  --   ALKPHOS  --  49  --   BILITOT  --  1.5*  --   GFRNONAA 45*  44* 37*  GFRAA 52* 51* 43*  ANIONGAP 5 8 6      Hematology Recent Labs  Lab 12/28/17 0434 12/28/17 1419 12/29/17 0358 12/30/17 0317  WBC 6.8  --  5.8 5.8  RBC 2.90*  --  2.86* 2.83*  HGB 7.8* 8.0* 7.7* 7.7*  HCT 26.4* 27.4* 26.1* 25.9*  MCV 91.0  --  91.3 91.5  MCH 26.9  --  26.9 27.2  MCHC 29.5*  --  29.5* 29.7*  RDW 14.5  --  14.3 14.3  PLT 235  --  216 226    Cardiac Enzymes Recent Labs  Lab 12/27/17 1258 12/28/17 0434  TROPONINI 0.03* <0.03   No results for input(s): TROPIPOC in the last 168 hours.   BNP Recent Labs  Lab 12/27/17 1258  BNP 540.2*     DDimer No results for input(s): DDIMER in the last 168 hours.   Radiology    No results found.  Cardiac Studies     Patient Profile     72 y.o.  female with anemia, ? GI bleed  Assessment & Plan    AFib, with elevated CHADs score.  WOuld like to start Eliquis if safe from a bleeding standpoint.  Await results of capsule study.  If no bleeding and Hbg stable, would like to consider anticoagulation.   This patients CHA2DS2-VASc Score and unadjusted Ischemic Stroke Rate (% per year) is equal to 3.2 % stroke rate/year from a score of 3  Above score calculated as 1 point each if present [CHF, HTN, DM, Vascular=MI/PAD/Aortic Plaque, Age if 65-74, or Female] Above score calculated as 2 points each if present [Age > 75, or Stroke/TIA/TE]       For questions or updates, please contact [facility] Please consult [email or web address] for contact info under        Signed, Larae Grooms, MD  12/30/2017, 12:29 PM

## 2017-12-30 NOTE — Care Management Important Message (Signed)
Important Message  Patient Details  Name: Latasha Guzman MRN: [id number] Date of Birth: 09/17/1945   Medicare Important Message Given:  Yes Patient asleep, unsigned copy left at bedside   Delorse Lek 12/30/2017, 4:01 PM

## 2017-12-30 NOTE — Progress Notes (Signed)
PROGRESS NOTE    Latasha Guzman  JQB:[id number] DOB: 03-Jul-1945 DOA: 12/27/2017 PCP: Nicoletta Dress, MD    Brief Narrative:  72 y.o. female with medical history significant for atrial fibrillation on Eliquis, s/p PPM, chronic diastolic CHF, type 2 diabetes, hyperlipidemia, hypertension, and chronic lymphedema of both lower extremities who was transferred to [facility] for atrial fibrillation with RVR and worsening edema.  She initially presented to her cardiologist's (Dr. Drue Dun) office to reestablish care.  She was noted to be in atrial fibrillation with accelerated ventricular rate and have significant swelling of both of her legs.  She was therefore sent to the [place] emergency department.  Patient says that she has chronic lymphedema both legs but has noticed progressive swelling recently.  She has been taking Lasix 80 mg once daily.  It seems that she has been drinking a lot of fluids due to dry mouth.  She reports occasional palpitations.  She otherwise denies chest pain, dyspnea, orthopnea, abdominal pain, dysuria, or any obvious bleeding including hemoptysis, hematemesis, hematuria, hematochezia, or melena.  In the ED, Initial vitals BP 122/68, pulse 115, RR 23, temp 98.7, SPO2 100% on 2L supplemental O2 Gutierrez.  Labs notable for WBC 6.6, hemoglobin 9.0, platelets 224.  Troponin I 0.03, BNP 540.2.  EKG showed atrial fibrillation with rapid ventricular response and PVCs.  2 view chest x-ray showed cardiomegaly with pulmonary vascular congestion.  She was started on a diltiazem infusion and given IV Lasix 80 mg once and transferred to [facility] for further care.  Assessment & Plan:   Principal Problem:   Atrial fibrillation with RVR (HCC) Active Problems:   Diabetes mellitus (HCC)   Essential hypertension   History of GI bleed   Chronic diastolic CHF (congestive heart failure) (HCC)   Lymphedema of both lower extremities   Hyperlipidemia associated  with type 2 diabetes mellitus (HCC)  Atrial fibrillation with RVR: -Started and continued on cardizem gtt. -Metoprolol added overnight with improvement in HR -Will transition off IV to PO cardizem -CHADS of 4. Started on eliquis overnight, however hgb down to 7.8, repeat hgb remains stable -Holding further eliquis pending results of capsule study below -Per Cardiology, if capsule study unremarkable, then possibility for resuming Eliquis  Chronic diastolic CHF: BLE edema of both her legs, a lot of which is likely secondary to her chronic lymphedema.   -Initial Chest x-ray with findings worrisome for pulmonary vascular congestion. -Given IV Lasix 80 mg in the ED -Had been continued with IV Lasix 40 mg BID -Cr up to 1.4, thus will hold further IV lasix and resume home PO lasix tomorrow  History of GI bleed with acute blood loss anemia: -Outside records from [place] reviewed on EMR -Patient had been seen by Dr. Lyda Jester for concerns of GI bleed -Pt is s/p EGD and colon on 11/04/17 with findings of mild diverticular disease and external hemorrhoids as well as hiatal hernia, otherwise unremarkable studies -Per Dr. Lyda Jester, consideration for capsule study if anemia worsens -Hgb remains stable at 7.7 this AM -Capsule study under way per GI, results anticipated tomorrow -Repeat CBC in AM  Type 2 diabetes: -Continue SSI coverage -Remains stable at present  Hyperlipidemia: -Continue with Atorvastatin as tolerated -Presently stable  DVT prophylaxis: SCD's Code Status: Full Family Communication: Pt in room, family not at bedside Disposition Plan: Uncertain at this time  Consultants:   Cardiology  GI  Procedures:     Antimicrobials: Anti-infectives (From admission, onward)  None      Subjective: Denies chest pains  Objective: Vitals:   12/30/17 0010 12/30/17 0424 12/30/17 0700 12/30/17 1215  BP: 106/65 112/62  133/88  Pulse: 81 87  (!) 110  Resp:        Temp: 98.3 F (36.8 C) 98.1 F (36.7 C)  98 F (36.7 C)  TempSrc: Oral Oral    SpO2: 94% 94%    Weight:  118.1 kg 112.3 kg     Intake/Output Summary (Last 24 hours) at 12/30/2017 1535 Last data filed at 12/30/2017 1215 Gross per 24 hour  Intake 240 ml  Output 1275 ml  Net -1035 ml   Filed Weights   12/29/17 0230 12/30/17 0424 12/30/17 0700  Weight: 114.1 kg 118.1 kg 112.3 kg    Examination: General exam: Conversant, in no acute distress Respiratory system: normal chest rise, clear, no audible wheezing Cardiovascular system: regular rhythm, s1-s2 Gastrointestinal system: Nondistended, nontender, pos BS Central nervous system: No seizures, no tremors Extremities: No cyanosis, no joint deformities, BLE edema with chronic skin changes Skin: No rashes, no pallor Psychiatry: Affect normal // no auditory hallucinations   Data Reviewed: I have personally reviewed following labs and imaging studies  CBC: Recent Labs  Lab 12/27/17 1258 12/28/17 0434 12/28/17 1419 12/29/17 0358 12/30/17 0317  WBC 6.6 6.8  --  5.8 5.8  NEUTROABS 4.9  --   --   --   --   HGB 9.0* 7.8* 8.0* 7.7* 7.7*  HCT 31.0* 26.4* 27.4* 26.1* 25.9*  MCV 93.7 91.0  --  91.3 91.5  PLT 224 235  --  216 127   Basic Metabolic Panel: Recent Labs  Lab 12/27/17 1258 12/28/17 0434 12/29/17 0358 12/30/17 0317  NA 139 139 138 139  K 3.6 3.1* 4.1 3.6  CL 102 106 107 105  CO2 26 28 23 28   GLUCOSE 127* 107* 97 106*  BUN 18 14 16 12   CREATININE 1.14* 1.19* 1.21* 1.40*  CALCIUM 9.4 9.1 9.0 9.2  MG  --  1.9  --   --    GFR: Estimated Creatinine Clearance: 45.7 mL/min (A) (by C-G formula based on SCr of 1.4 mg/dL (H)). Liver Function Tests: Recent Labs  Lab 12/29/17 0358  AST 31  ALT 7  ALKPHOS 49  BILITOT 1.5*  PROT 5.4*  ALBUMIN 2.5*   No results for input(s): LIPASE, AMYLASE in the last 168 hours. No results for input(s): AMMONIA in the last 168 hours. Coagulation Profile: No results for  input(s): INR, PROTIME in the last 168 hours. Cardiac Enzymes: Recent Labs  Lab 12/27/17 1258 12/28/17 0434  TROPONINI 0.03* <0.03   BNP (last 3 results) No results for input(s): PROBNP in the last 8760 hours. HbA1C: Recent Labs    12/28/17 0434  HGBA1C 4.2*   CBG: Recent Labs  Lab 12/29/17 1202 12/29/17 1639 12/29/17 2133 12/30/17 0805 12/30/17 1123  GLUCAP 112* 95 97 90 85   Lipid Profile: No results for input(s): CHOL, HDL, LDLCALC, TRIG, CHOLHDL, LDLDIRECT in the last 72 hours. Thyroid Function Tests: No results for input(s): TSH, T4TOTAL, FREET4, T3FREE, THYROIDAB in the last 72 hours. Anemia Panel: No results for input(s): VITAMINB12, FOLATE, FERRITIN, TIBC, IRON, RETICCTPCT in the last 72 hours. Sepsis Labs: No results for input(s): PROCALCITON, LATICACIDVEN in the last 168 hours.  No results found for this or any previous visit (from the past 240 hour(s)).   Radiology Studies: No results found.  Scheduled Meds: . atorvastatin  80 mg  Oral q1800  . diltiazem  90 mg Oral Q6H  . [START ON 12/31/2017] furosemide  80 mg Oral Daily  . insulin aspart  0-9 Units Subcutaneous TID WC  . metoprolol tartrate  12.5 mg Oral BID  . pantoprazole  40 mg Oral Daily  . polyethylene glycol  17 g Oral BID  . sodium chloride flush  3 mL Intravenous Q12H   Continuous Infusions:    LOS: 3 days   Marylu Lund, MD [facility] Pager On Amion  If 7PM-7AM, please contact night-coverage 12/30/2017, 3:35 PM

## 2017-12-31 ENCOUNTER — Encounter (HOSPITAL_COMMUNITY): Payer: Self-pay | Admitting: Gastroenterology

## 2017-12-31 LAB — BASIC METABOLIC PANEL
ANION GAP: 8 (ref 5–15)
BUN: 17 mg/dL (ref 8–23)
CALCIUM: 8.8 mg/dL — AB (ref 8.9–10.3)
CHLORIDE: 105 mmol/L (ref 98–111)
CO2: 24 mmol/L (ref 22–32)
Creatinine, Ser: 1.39 mg/dL — ABNORMAL HIGH (ref 0.44–1.00)
GFR calc non Af Amer: 37 mL/min — ABNORMAL LOW (ref >60)
GFR, EST AFRICAN AMERICAN: 43 mL/min — AB (ref >60)
GLUCOSE: 85 mg/dL (ref 70–99)
POTASSIUM: 4 mmol/L (ref 3.5–5.1)
Sodium: 137 mmol/L (ref 135–145)

## 2017-12-31 LAB — CBC
HCT: 26.8 % — ABNORMAL LOW (ref 36.0–46.0)
HEMOGLOBIN: 7.7 g/dL — AB (ref 12.0–15.0)
MCH: 26.5 pg (ref 26.0–34.0)
MCHC: 28.7 g/dL — ABNORMAL LOW (ref 30.0–36.0)
MCV: 92.1 fL (ref 80.0–100.0)
PLATELETS: 259 10*3/uL (ref 150–400)
RBC: 2.91 MIL/uL — AB (ref 3.87–5.11)
RDW: 14.4 % (ref 11.5–15.5)
WBC: 6.2 10*3/uL (ref 4.0–10.5)
nRBC: 0 % (ref 0.0–0.2)

## 2017-12-31 LAB — GLUCOSE, CAPILLARY
Glucose-Capillary: 97 mg/dL (ref 70–99)
Glucose-Capillary: 101 mg/dL — ABNORMAL HIGH (ref 70–99)
Glucose-Capillary: 93 mg/dL (ref 70–99)
Glucose-Capillary: 102 mg/dL — ABNORMAL HIGH (ref 70–99)

## 2017-12-31 MED ORDER — APIXABAN 5 MG PO TABS
5.0000 mg | ORAL_TABLET | Freq: Two times a day (BID) | ORAL | Status: DC
  Administered 2017-12-31 – 2018-01-01 (×3): 5 mg via ORAL
  Filled 2017-12-31 (×3): qty 1

## 2017-12-31 NOTE — Progress Notes (Addendum)
PROGRESS NOTE    Latasha Guzman  XNT:[id number] DOB: 05-17-1945 DOA: 12/27/2017 PCP: Nicoletta Dress, MD    Brief Narrative:  72 y.o. female with medical history significant for atrial fibrillation on Eliquis, s/p PPM, chronic diastolic CHF, type 2 diabetes, hyperlipidemia, hypertension, and chronic lymphedema of both lower extremities who was transferred to [facility] for atrial fibrillation with RVR and worsening edema.  She initially presented to her cardiologist's (Dr. Drue Dun) office to reestablish care.  She was noted to be in atrial fibrillation with accelerated ventricular rate and have significant swelling of both of her legs.  She was therefore sent to the [place] emergency department.  Patient says that she has chronic lymphedema both legs but has noticed progressive swelling recently.  She has been taking Lasix 80 mg once daily.  It seems that she has been drinking a lot of fluids due to dry mouth.  She reports occasional palpitations.  She otherwise denies chest pain, dyspnea, orthopnea, abdominal pain, dysuria, or any obvious bleeding including hemoptysis, hematemesis, hematuria, hematochezia, or melena.  In the ED, Initial vitals BP 122/68, pulse 115, RR 23, temp 98.7, SPO2 100% on 2L supplemental O2 [place].  Labs notable for WBC 6.6, hemoglobin 9.0, platelets 224.  Troponin I 0.03, BNP 540.2.  EKG showed atrial fibrillation with rapid ventricular response and PVCs.  2 view chest x-ray showed cardiomegaly with pulmonary vascular congestion.  She was started on a diltiazem infusion and given IV Lasix 80 mg once and transferred to [facility] for further care.  Assessment & Plan:   Principal Problem:   Atrial fibrillation with RVR (HCC) Active Problems:   Diabetes mellitus (HCC)   Essential hypertension   History of GI bleed   Chronic diastolic CHF (congestive heart failure) (HCC)   Lymphedema of both lower extremities   Hyperlipidemia associated  with type 2 diabetes mellitus ([place])  Atrial fibrillation with RVR: -Initially started and continued on cardizem gtt. -Metoprolol added overnight with improvement in HR -Will transition off IV to PO cardizem -CHADS of 4. Started on eliquis overnight, however hgb down to 7.8, repeat hgb has remained stable -Completed pill endoscopy 11/9 with unremarkable findings. Recommendation by GI and Cardiology to resume therapeutic anticoagulation -Have resumed eliquis. Will repeat CBC in AM  Acute on Chronic diastolic CHF: BLE edema of both her legs, a lot of which is likely secondary to her chronic lymphedema.   -Initial Chest x-ray with findings worrisome for pulmonary vascular congestion. -Given IV Lasix 80 mg in the ED -Had been continued with IV Lasix 40 mg BID -Cr peaked to 1.4, thus have resumed home PO lasix  History of GI bleed with acute blood loss anemia: -Outside records from [place] reviewed on EMR -Patient had been seen by Dr. Lyda Jester for concerns of GI bleed -Pt is s/p EGD and colon on 11/04/17 with findings of mild diverticular disease and external hemorrhoids as well as hiatal hernia, otherwise unremarkable studies -Per Dr. Lyda Jester, consideration for capsule study if anemia worsens -Hgb remains stable at 7.7 today -Capsule study completed, unremarkable findings with recommendation to resume eliquis -Repeat CBC in AM  Type 2 diabetes: -Continued SSI coverage -Remains stable currently  Hyperlipidemia: -Continue with Atorvastatin as tolerated -Currently stable  DVT prophylaxis: eliquis Code Status: Full Family Communication: Pt in room, family not at bedside Disposition Plan: Uncertain at this time  Consultants:   Cardiology  GI  Procedures:   Pill endoscopy 11/8-11/9  Antimicrobials: Anti-infectives (From admission,  onward)   None      Subjective: No chest pains. Reports feeling well  Objective: Vitals:   12/31/17 0816 12/31/17 0900 12/31/17  1135 12/31/17 1325  BP: 115/66  (!) 125/99 110/73  Pulse: 83   94  Resp:      Temp:  98.6 F (37 C)  98.8 F (37.1 C)  TempSrc:  Oral  Oral  SpO2:  97%  100%  Weight:        Intake/Output Summary (Last 24 hours) at 12/31/2017 1457 Last data filed at 12/31/2017 1237 Gross per 24 hour  Intake 221 ml  Output 450 ml  Net -229 ml   Filed Weights   12/30/17 0424 12/30/17 0700 12/31/17 0502  Weight: 118.1 kg 112.3 kg 112 kg    Examination: General exam: Awake, laying in bed, in nad Respiratory system: Normal respiratory effort, no wheezing Cardiovascular system: regular rate, s1, s2 Gastrointestinal system: Soft, nondistended, positive BS Central nervous system: CN2-12 grossly intact, strength intact Extremities: Perfused, no clubbing, BLE edema with chronic skin changes Skin: Normal skin turgor, no notable skin lesions seen Psychiatry: Mood normal // no visual hallucinations    Data Reviewed: I have personally reviewed following labs and imaging studies  CBC: Recent Labs  Lab 12/27/17 1258 12/28/17 0434 12/28/17 1419 12/29/17 0358 12/30/17 0317 12/31/17 0332  WBC 6.6 6.8  --  5.8 5.8 6.2  NEUTROABS 4.9  --   --   --   --   --   HGB 9.0* 7.8* 8.0* 7.7* 7.7* 7.7*  HCT 31.0* 26.4* 27.4* 26.1* 25.9* 26.8*  MCV 93.7 91.0  --  91.3 91.5 92.1  PLT 224 235  --  216 226 010   Basic Metabolic Panel: Recent Labs  Lab 12/27/17 1258 12/28/17 0434 12/29/17 0358 12/30/17 0317 12/31/17 0332  NA 139 139 138 139 137  K 3.6 3.1* 4.1 3.6 4.0  CL 102 106 107 105 105  CO2 26 28 23 28 24   GLUCOSE 127* 107* 97 106* 85  BUN 18 14 16 12 17   CREATININE 1.14* 1.19* 1.21* 1.40* 1.39*  CALCIUM 9.4 9.1 9.0 9.2 8.8*  MG  --  1.9  --   --   --    GFR: Estimated Creatinine Clearance: 45.9 mL/min (A) (by C-G formula based on SCr of 1.39 mg/dL (H)). Liver Function Tests: Recent Labs  Lab 12/29/17 0358  AST 31  ALT 7  ALKPHOS 49  BILITOT 1.5*  PROT 5.4*  ALBUMIN 2.5*   No  results for input(s): LIPASE, AMYLASE in the last 168 hours. No results for input(s): AMMONIA in the last 168 hours. Coagulation Profile: No results for input(s): INR, PROTIME in the last 168 hours. Cardiac Enzymes: Recent Labs  Lab 12/27/17 1258 12/28/17 0434  TROPONINI 0.03* <0.03   BNP (last 3 results) No results for input(s): PROBNP in the last 8760 hours. HbA1C: No results for input(s): HGBA1C in the last 72 hours. CBG: Recent Labs  Lab 12/30/17 1123 12/30/17 1706 12/30/17 2141 12/31/17 0731 12/31/17 1121  GLUCAP 85 93 133* 97 101*   Lipid Profile: No results for input(s): CHOL, HDL, LDLCALC, TRIG, CHOLHDL, LDLDIRECT in the last 72 hours. Thyroid Function Tests: No results for input(s): TSH, T4TOTAL, FREET4, T3FREE, THYROIDAB in the last 72 hours. Anemia Panel: No results for input(s): VITAMINB12, FOLATE, FERRITIN, TIBC, IRON, RETICCTPCT in the last 72 hours. Sepsis Labs: No results for input(s): PROCALCITON, LATICACIDVEN in the last 168 hours.  No results found for  this or any previous visit (from the past 240 hour(s)).   Radiology Studies: No results found.  Scheduled Meds: . apixaban  5 mg Oral BID  . atorvastatin  80 mg Oral q1800  . diltiazem  90 mg Oral Q6H  . furosemide  80 mg Oral Daily  . insulin aspart  0-9 Units Subcutaneous TID WC  . metoprolol tartrate  12.5 mg Oral BID  . pantoprazole  40 mg Oral Daily  . polyethylene glycol  17 g Oral BID  . sodium chloride flush  3 mL Intravenous Q12H   Continuous Infusions:    LOS: 4 days   Marylu Lund, MD [facility] Pager On Amion  If 7PM-7AM, please contact night-coverage 12/31/2017, 2:57 PM

## 2017-12-31 NOTE — Progress Notes (Signed)
CSW received consult regarding PT recommendation of SNF at discharge.  Patient is refusing SNF. She reports that she would like to return home with home health. She currently uses [facility]. Will alert RNCM.   CSW signing off.   Percell Locus Felice Deem LCSW [phone]

## 2017-12-31 NOTE — Progress Notes (Signed)
Small bowel PillCam study showed a normal small bowel mucosa. No evidence of active or recent bleeding. Recommend iron supplementation, periodic IV iron infusions. No contraindication for anticoagulation from GI standpoint.  Discussed with patient as well as her hospitalist Dr. Wyline Copas. We will sign off please recall if needed.  Ronnette Juniper, M.D.

## 2017-12-31 NOTE — Care Management Note (Addendum)
Case Management Note  Patient Details  Name: Latasha Guzman MRN: [id number] Date of Birth: Feb 07, 1946  Subjective/Objective:                Pt presented for new onset Afib and GI bleed.  Pt had been on Xarelto prior to admission and had switched to Eliquis just 2-3 day prior to admission.  Pt's doctor had given her samples of Eliquis and she had yet to fill a prescription, so her cost is unknown.  Pt states her Xarelto cost around $1.20/mo.    Pt lives home alone and has Therapist, sports and PT from [facility] to visit.  She would like to resume this service at d/c.  RN provides dressing changes and monitoring.  PT is "on hold" until her leg swelling is improved. Pt has RW, 3n1, and has no other DME needs.  Pt's niece provides transportation as needed.   Pt also has a personal care assistant/?CNA that is with her 2 hours/day on MWF.  She is unsure what agency this service is through and states her insurance pays for this.     Action/Plan: Orders, wound care notes and PT/OT notes faxed to [facility] so that services may be resumed.  Pt given Eliquis cards.  She states she will need to fill first prescription later this week.   Expected Discharge Date:       01/01/18           Expected Discharge Plan:  [place]  In-House Referral:  NA  Discharge planning Services  CM Consult  Post Acute Care Choice:  Home Health Choice offered to:  Patient  DME Arranged:  N/A DME Agency:  NA  HH Arranged:  RN, PT, OT HH Agency:  [place]  Status of Service:  Completed, signed off  If discussed at [place] of Stay Meetings, dates discussed:    Additional Comments:  Claudie Leach, RN 12/31/2017, 5:19 PM

## 2017-12-31 NOTE — Progress Notes (Signed)
[facility] will sign off.   Medication Recommendations: Resume Eliquis.  Continue short acting diltiazem and Lopressor. Other recommendations (labs, testing, etc): None. Follow up as an outpatient: With Dr. Agustin Cree

## 2017-12-31 NOTE — Progress Notes (Signed)
ANTICOAGULATION CONSULT NOTE - Initial Consult  Pharmacy Consult for apixaban Indication: atrial fibrillation  Allergies  Allergen Reactions  . Iodine Hives, Other (See Comments) and Shortness Of Breath    Throat swells  . Iodinated Diagnostic Agents   . Penicillins Hives    Has patient had a PCN reaction causing immediate rash, facial/tongue/throat swelling, SOB or lightheadedness with hypotension: Yes Has patient had a PCN reaction causing severe rash involving mucus membranes or skin necrosis: No Has patient had a PCN reaction that required hospitalization: No Has patient had a PCN reaction occurring within the last 10 years: No If all of the above answers are "NO", then may proceed with Cephalosporin use.     Patient Measurements: Weight: 247 lb (112 kg)  Vital Signs: Temp: 98.6 F (37 C) (11/09 0900) Temp Source: Oral (11/09 0900) BP: 115/66 (11/09 0816) Pulse Rate: 83 (11/09 0816)  Labs: Recent Labs    12/29/17 0358 12/30/17 0317 12/31/17 0332  HGB 7.7* 7.7* 7.7*  HCT 26.1* 25.9* 26.8*  PLT 216 226 259  CREATININE 1.21* 1.40* 1.39*    Estimated Creatinine Clearance: 45.9 mL/min (A) (by C-G formula based on SCr of 1.39 mg/dL (H)).   Medical History: Past Medical History:  Diagnosis Date  . Acute blood loss anemia 08/02/2017  . Atrial fibrillation ([place])   . Chronic atrial fibrillation 10/15/2015  . Chronic diastolic CHF (congestive heart failure) ([place])   . Diabetes mellitus ([place]) 07/27/2017  . Edema   . Essential hypertension 10/15/2015  . GERD (gastroesophageal reflux disease) 07/27/2017  . GI bleed 07/24/2017  . Hyperlipidemia   . Pacemaker 10/15/2015  . Pneumonia of right lower lobe due to infectious organism ([place]) 08/02/2017  . Skin rash 08/02/2017  . Type 2 diabetes mellitus without complication ([place]) 1/65/7903     Assessment: 21 yoF admitted with AFib and possible GI bleed. Eliquis PTA has been held while awaiting results of capsule study. Pharmacy  consulted to resume Eliquis today as capsule study was negative. Hgb stable today from yesterday.  Goal of Therapy:  Monitor platelets by anticoagulation protocol: Yes   Plan:  -Eliquis 5mg  PO twice daily -Pharmacy will sign off, reconsult if needed  Arrie Senate, PharmD, BCPS Clinical Pharmacist [phone] Please check AMION for all [place] numbers 12/31/2017

## 2017-12-31 NOTE — Evaluation (Signed)
Occupational Therapy Evaluation Patient Details Name: Latasha Guzman MRN: [id number] DOB: 1945-05-18 Today's Date: 12/31/2017    History of Present Illness Pt is a 72 y/o female admitted secondary to worsening LE swelling. Found to have a fib with RVR as well and anemia of unknown source. Pt likley for capsule study on 11/8. PMH includes DM, HTN, a fib, dCHF and s/p pacemaker.    Clinical Impression   Pt walks with a walker and is assisted for IADL at baseline. She sleeps in a lift chair. Pt has chronic back pain limiting mobility. She was able to stand from bed, BSC and chair with min guard assist and ambulate with min guard assist x 2 laps in her room. Pt needs set up to max assist for ADL (specifically for pericare). She feels confident she can manage at home given today's performance and is declining SNF. Will follow acutely.    Follow Up Recommendations  Home health OT    Equipment Recommendations       Recommendations for Other Services       Precautions / Restrictions Precautions Precautions: Fall Restrictions Weight Bearing Restrictions: No      Mobility Bed Mobility Overal bed mobility: Needs Assistance Bed Mobility: Rolling;Sidelying to Sit Rolling: Min assist Sidelying to sit: Min assist       General bed mobility comments: used log roll technique to minimize back pain  Transfers Overall transfer level: Needs assistance Equipment used: Rolling walker (2 wheeled) Transfers: Sit to/from Stand Sit to Stand: Min guard         General transfer comment: increased time    Balance Overall balance assessment: Needs assistance   Sitting balance-Leahy Scale: Fair       Standing balance-Leahy Scale: Fair Standing balance comment: can release walker in static standing                           ADL either performed or assessed with clinical judgement   ADL Overall ADL's : Needs assistance/impaired Eating/Feeding: Independent;Sitting    Grooming: Supervision/safety;Standing;Wash/dry hands   Upper Body Bathing: Set up;Sitting   Lower Body Bathing: Min guard;Sit to/from stand   Upper Body Dressing : Set up;Sitting   Lower Body Dressing: Min guard;Sit to/from stand   Toilet Transfer: Min guard;RW;Ambulation;BSC   Toileting- Water quality scientist and Hygiene: +2 for physical assistance;Total assistance;Sit to/from stand       Functional mobility during ADLs: Min guard;Rolling walker       Vision Patient Visual Report: No change from baseline       Perception     Praxis      Pertinent Vitals/Pain Pain Assessment: Faces Faces Pain Scale: Hurts even more Pain Location: back  Pain Descriptors / Indicators: Spasm Pain Intervention(s): Repositioned;Ice applied;Monitored during session     Hand Dominance Right   Extremity/Trunk Assessment Upper Extremity Assessment Upper Extremity Assessment: Overall WFL for tasks assessed   Lower Extremity Assessment Lower Extremity Assessment: Defer to PT evaluation   Cervical / Trunk Assessment Cervical / Trunk Assessment: Other exceptions Cervical / Trunk Exceptions: reporting "back spasms" with mobility.    Communication Communication Communication: No difficulties   Cognition Arousal/Alertness: Awake/alert Behavior During Therapy: WFL for tasks assessed/performed Overall Cognitive Status: Within Functional Limits for tasks assessed  General Comments       Exercises     Shoulder Instructions      Home Living Family/patient expects to be discharged to:: Private residence Living Arrangements: Alone Available Help at Discharge: Personal care attendant;Friend(s);Available PRN/intermittently(PCA 3 x a week for 2 hours, RN daily to wrap legs) Type of Home: Apartment Home Access: Level entry     Home Layout: One level     Bathroom Shower/Tub: Occupational psychologist: Standard     Home  Equipment: Environmental consultant - 2 wheels;Bedside commode          Prior Functioning/Environment Level of Independence: Needs assistance  Gait / Transfers Assistance Needed: Uses RW for ambulation  ADL's / Homemaking Assistance Needed: Requires assist for IADL tasks such as cooking, cleaning, etc.             OT Problem List: Decreased strength;Decreased activity tolerance;Impaired balance (sitting and/or standing);Pain;Obesity;Decreased knowledge of use of DME or AE      OT Treatment/Interventions: Self-care/ADL training;DME and/or AE instruction;Patient/family education;Balance training;Therapeutic activities    OT Goals(Current goals can be found in the care plan section) Acute Rehab OT Goals Patient Stated Goal: to go home OT Goal Formulation: With patient Time For Goal Achievement: 01/14/18 Potential to Achieve Goals: Good ADL Goals Pt Will Perform Grooming: with modified independence;standing Pt Will Perform Lower Body Bathing: with modified independence;sit to/from stand Pt Will Perform Lower Body Dressing: with modified independence;sit to/from stand Pt Will Transfer to Toilet: with modified independence;ambulating;bedside commode(over toilet) Pt Will Perform Toileting - Clothing Manipulation and hygiene: with modified independence;sit to/from stand  OT Frequency: Min 2X/week   Barriers to D/C:            Co-evaluation              AM-PAC PT "6 Clicks" Daily Activity     Outcome Measure Help from another person eating meals?: None Help from another person taking care of personal grooming?: A Little Help from another person toileting, which includes using toliet, bedpan, or urinal?: A Lot Help from another person bathing (including washing, rinsing, drying)?: A Little Help from another person to put on and taking off regular upper body clothing?: None Help from another person to put on and taking off regular lower body clothing?: A Little 6 Click Score: 19   End of  Session Equipment Utilized During Treatment: Gait belt;Rolling walker Nurse Communication: Mobility status  Activity Tolerance: Patient tolerated treatment well Patient left: in chair;with call bell/phone within reach  OT Visit Diagnosis: Unsteadiness on feet (R26.81);Other abnormalities of gait and mobility (R26.89);Pain;Muscle weakness (generalized) (M62.81)                Time: 2585-2778 OT Time Calculation (min): 53 min Charges:  OT General Charges $OT Visit: 1 Visit OT Evaluation $OT Eval Moderate Complexity: 1 Mod OT Treatments $Self Care/Home Management : 23-37 mins  Nestor Lewandowsky, OTR/L Acute Rehabilitation Services Pager: [phone] Office: [phone]  Malka So 12/31/2017, 12:18 PM

## 2018-01-01 LAB — CBC
HEMATOCRIT: 27.5 % — AB (ref 36.0–46.0)
HEMOGLOBIN: 8.2 g/dL — AB (ref 12.0–15.0)
MCH: 26.9 pg (ref 26.0–34.0)
MCHC: 29.8 g/dL — AB (ref 30.0–36.0)
MCV: 90.2 fL (ref 80.0–100.0)
Platelets: 258 10*3/uL (ref 150–400)
RBC: 3.05 MIL/uL — ABNORMAL LOW (ref 3.87–5.11)
RDW: 14.3 % (ref 11.5–15.5)
WBC: 5.7 10*3/uL (ref 4.0–10.5)
nRBC: 0 % (ref 0.0–0.2)

## 2018-01-01 LAB — BASIC METABOLIC PANEL
Anion gap: 6 (ref 5–15)
BUN: 16 mg/dL (ref 8–23)
CO2: 28 mmol/L (ref 22–32)
CREATININE: 1.31 mg/dL — AB (ref 0.44–1.00)
Calcium: 9.2 mg/dL (ref 8.9–10.3)
Chloride: 105 mmol/L (ref 98–111)
GFR calc non Af Amer: 40 mL/min — ABNORMAL LOW (ref >60)
GFR, EST AFRICAN AMERICAN: 46 mL/min — AB (ref >60)
Glucose, Bld: 98 mg/dL (ref 70–99)
Potassium: 4 mmol/L (ref 3.5–5.1)
Sodium: 139 mmol/L (ref 135–145)

## 2018-01-01 LAB — GLUCOSE, CAPILLARY: Glucose-Capillary: 92 mg/dL (ref 70–99)

## 2018-01-01 MED ORDER — METOPROLOL TARTRATE 25 MG PO TABS: 12.5000 mg | ORAL_TABLET | Freq: Two times a day (BID) | ORAL | 0 refills | Status: DC

## 2018-01-01 MED ORDER — DILTIAZEM HCL 90 MG PO TABS: 90.0000 mg | ORAL_TABLET | Freq: Four times a day (QID) | ORAL | 0 refills | Status: DC

## 2018-01-01 NOTE — Discharge Summary (Signed)
Physician Discharge Summary  Latasha Guzman JJK:[id number] DOB: 1946/01/17 DOA: 12/27/2017  PCP: Nicoletta Dress, MD  Admit date: 12/27/2017 Discharge date: 01/01/2018  Admitted From: Home Disposition:  Home  Recommendations for Outpatient Follow-up:  1. Follow up with PCP in 1-2 weeks 2. Follow up with Dr. Lyda Jester as scheduled 3. Follow up with Dr. Bobby Rumpf as scheduled 4. Follow up with Dr. Agustin Cree as scheduled  Home Health:PT, OT, RN   Discharge Condition:Improved CODE STATUS:Full Diet recommendation: Heart healthy   Brief/Interim Summary: 72 y.o.femalewith medical history significant for atrial fibrillation on Eliquis, s/p PPM, chronic diastolic CHF, type 2 diabetes, hyperlipidemia, hypertension, and chronic lymphedema of both lower extremities who was transferred to [facility] for atrial fibrillation with RVR and worsening edema. She initially presented to her cardiologist's (Dr. Drue Dun) office to reestablish care. She was noted to be in atrial fibrillation with accelerated ventricular rate and have significant swelling of both of her legs. She was therefore sent to the [place] emergency department. Patient says that she has chronic lymphedema both legs but has noticed progressive swelling recently. She has been taking Lasix 80 mg once daily. It seems that she has been drinking a lot of fluids due to dry mouth. She reports occasional palpitations. She otherwise denies chest pain, dyspnea, orthopnea, abdominal pain, dysuria, or any obvious bleeding including hemoptysis, hematemesis, hematuria, hematochezia, or melena.  In the ED, Initial vitals BP 122/68, pulse 115, RR 23, temp 98.7, SPO2 100% on 2L supplemental O2 [place].  Labs notable for WBC 6.6, hemoglobin 9.0, platelets 224. Troponin I 0.03, BNP 540.2.  EKG showed atrial fibrillation with rapid ventricular response and PVCs.  2 view chest x-ray showed cardiomegaly with pulmonary  vascular congestion.  She was started on a diltiazem infusion and given IV Lasix 80 mg once and transferred to [facility] for further care.   Discharge Diagnoses:  Principal Problem:   Atrial fibrillation with RVR ([place]) Active Problems:   Diabetes mellitus ([place])   Essential hypertension   History of GI bleed   Chronic diastolic CHF (congestive heart failure) (HCC)   Lymphedema of both lower extremities   Hyperlipidemia associated with type 2 diabetes mellitus (HCC)  Atrial fibrillation with RVR: -Initially started and continued on cardizem gtt. -Metoprolol added overnight with improvement in HR -Transitioned off IV to PO cardizem -CHADS of 4. Started on eliquis overnight, however hgb trended down to a low of 7.7, repeat hgb has remained stable -Completed pill endoscopy 11/9 with unremarkable findings. Recommendation by GI and Cardiology to resume therapeutic anticoagulation -Have resumed eliquis -Hgb after resuming eliquis of 8.2 -GI and Cardiology has since signed off  Chronic diastolic CHF: BLE edema of both her legs, a lot of which is likely secondary to her chronic lymphedema.  -Initial Chest x-ray with findings worrisome for pulmonary vascular congestion. -Given IV Lasix 80 mg in the ED -Had been continued with IV Lasix 40 mg BID -Cr peaked to 1.4, resumed home dose PO lasix, Cr improved afterwards  History of GI bleed with acute blood loss anemia: -Outside records from [place] reviewed on EMR -Patient had been seen by Dr. Lyda Jester for concerns of GI bleed -Pt is s/p EGD and colon on 11/04/17 with findings of mild diverticular disease and external hemorrhoids as well as hiatal hernia, otherwise unremarkable studies -Per Dr. Lyda Jester, consideration for capsule study if anemia worsens -Hgb with a low of 7.7 -Capsule study completed, unremarkable findings with recommendation to resume eliquis -Repeat hgb  of 8.2 after resuming eliquis  Type 2  diabetes: -Continued SSI coverage while in hospital -Remains stable currently  Hyperlipidemia: -Continue with Atorvastatin as tolerated -Currently stable   Discharge Instructions   Allergies as of 01/01/2018      Reactions   Iodine Hives, Other (See Comments), Shortness Of Breath   Throat swells   Iodinated Diagnostic Agents    Penicillins Hives   Has patient had a PCN reaction causing immediate rash, facial/tongue/throat swelling, SOB or lightheadedness with hypotension: Yes Has patient had a PCN reaction causing severe rash involving mucus membranes or skin necrosis: No Has patient had a PCN reaction that required hospitalization: No Has patient had a PCN reaction occurring within the last 10 years: No If all of the above answers are "NO", then may proceed with Cephalosporin use.      Medication List    TAKE these medications   acetaminophen 325 MG tablet Commonly known as:  TYLENOL Take 325 mg by mouth every 6 (six) hours as needed for mild pain or fever.   atorvastatin 80 MG tablet Commonly known as:  LIPITOR Take 80 mg by mouth at bedtime.   diltiazem 90 MG tablet Commonly known as:  CARDIZEM Take 1 tablet (90 mg total) by mouth every 6 (six) hours. What changed:    how much to take  when to take this   ELIQUIS 5 MG Tabs tablet Generic drug:  apixaban Take 10 mg by mouth every evening.   fenofibrate 160 MG tablet Take 160 mg by mouth every morning.   furosemide 80 MG tablet Commonly known as:  LASIX Take 80 mg by mouth every morning.   LORazepam 0.5 MG tablet Commonly known as:  ATIVAN Take 0.5 mg by mouth as needed for anxiety.   metoprolol tartrate 25 MG tablet Commonly known as:  LOPRESSOR Take 0.5 tablets (12.5 mg total) by mouth 2 (two) times daily.   omeprazole 20 MG capsule Commonly known as:  PRILOSEC Take 20 mg by mouth every morning.   PROAIR HFA 108 (90 Base) MCG/ACT inhaler Generic drug:  albuterol Inhale 2 puffs into the lungs  every 6 (six) hours as needed for wheezing or shortness of breath.   Vitamin D3 50 MCG (2000 UT) capsule Take 6,000 Units by mouth daily.      Follow-up Information    Constance Haw, MD Follow up on 01/30/2018.   Specialty:  Cardiology Why:  at 2:15PM for pacemaker follow up Contact information: [place] [address] [phone]        Park Liter, MD. Schedule an appointment as soon as possible for a visit.   Specialty:  Cardiology Contact information: [place] [address] [phone]        Misenheimer, Christia Reading, MD. Schedule an appointment as soon as possible for a visit.   Specialty:  Unknown Physician Specialty Contact information: [place] [address] [phone]        Marice Potter, MD. Schedule an appointment as soon as possible for a visit.   Specialty:  Oncology Contact information: [address] [place] [address] [phone]          Allergies  Allergen Reactions  . Iodine Hives, Other (See Comments) and Shortness Of Breath    Throat swells  . Iodinated Diagnostic Agents   . Penicillins Hives    Has patient had a PCN reaction causing immediate rash, facial/tongue/throat swelling, SOB or lightheadedness with hypotension:  Yes Has patient had a PCN reaction causing severe rash involving mucus membranes or skin necrosis: No Has patient had a PCN reaction that required hospitalization: No Has patient had a PCN reaction occurring within the last 10 years: No If all of the above answers are "NO", then may proceed with Cephalosporin use.     Consultations:  GI  Cardiology  Procedures/Studies: Dg Chest 2 View  Result Date: 12/27/2017 CLINICAL DATA:  Acute shortness of breath EXAM: CHEST - 2 VIEW COMPARISON:  None. FINDINGS: Cardiomegaly and pulmonary vascular congestion noted. A LEFT pacemaker is noted. Trace bilateral pleural effusions are present There is no  evidence of focal airspace disease, pulmonary edema, suspicious pulmonary nodule/mass or pneumothorax. No acute bony abnormalities are identified. IMPRESSION: Cardiomegaly with pulmonary vascular congestion and trace bilateral pleural effusions. Electronically Signed   By: Margarette Canada M.D.   On: 12/27/2017 13:20     Subjective: Eager to go home  Discharge Exam: Vitals:   01/01/18 0324 01/01/18 0843  BP: 123/79 120/71  Pulse: 91 88  Resp: 18   Temp: 98.4 F (36.9 C)   SpO2: 95%    Vitals:   12/31/17 2200 01/01/18 0013 01/01/18 0324 01/01/18 0843  BP:  108/60 123/79 120/71  Pulse: (!) 104 91 91 88  Resp:  18 18   Temp:   98.4 F (36.9 C)   TempSrc:   Oral   SpO2:   95%   Weight:   111.8 kg     General: Pt is alert, awake, not in acute distress Cardiovascular: RRR, S1/S2 +, no rubs, no gallops Respiratory: CTA bilaterally, no wheezing, no rhonchi Abdominal: Soft, NT, ND, bowel sounds + Extremities: no edema, no cyanosis   The results of significant diagnostics from this hospitalization (including imaging, microbiology, ancillary and laboratory) are listed below for reference.     Microbiology: No results found for this or any previous visit (from the past 240 hour(s)).   Labs: BNP (last 3 results) Recent Labs    12/27/17 1258  BNP 937.9*   Basic Metabolic Panel: Recent Labs  Lab 12/28/17 0434 12/29/17 0358 12/30/17 0317 12/31/17 0332 01/01/18 0732  NA 139 138 139 137 139  K 3.1* 4.1 3.6 4.0 4.0  CL 106 107 105 105 105  CO2 28 23 28 24 28   GLUCOSE 107* 97 106* 85 98  BUN 14 16 12 17 16   CREATININE 1.19* 1.21* 1.40* 1.39* 1.31*  CALCIUM 9.1 9.0 9.2 8.8* 9.2  MG 1.9  --   --   --   --    Liver Function Tests: Recent Labs  Lab 12/29/17 0358  AST 31  ALT 7  ALKPHOS 49  BILITOT 1.5*  PROT 5.4*  ALBUMIN 2.5*   No results for input(s): LIPASE, AMYLASE in the last 168 hours. No results for input(s): AMMONIA in the last 168 hours. CBC: Recent Labs   Lab 12/27/17 1258 12/28/17 0434 12/28/17 1419 12/29/17 0358 12/30/17 0317 12/31/17 0332 01/01/18 0405  WBC 6.6 6.8  --  5.8 5.8 6.2 5.7  NEUTROABS 4.9  --   --   --   --   --   --   HGB 9.0* 7.8* 8.0* 7.7* 7.7* 7.7* 8.2*  HCT 31.0* 26.4* 27.4* 26.1* 25.9* 26.8* 27.5*  MCV 93.7 91.0  --  91.3 91.5 92.1 90.2  PLT 224 235  --  216 226 259 258   Cardiac Enzymes: Recent Labs  Lab 12/27/17 1258 12/28/17 0434  TROPONINI 0.03* <  0.03   BNP: Invalid input(s): POCBNP CBG: Recent Labs  Lab 12/31/17 0731 12/31/17 1121 12/31/17 1620 12/31/17 2103 01/01/18 0727  GLUCAP 97 101* 93 102* 92   D-Dimer No results for input(s): DDIMER in the last 72 hours. Hgb A1c No results for input(s): HGBA1C in the last 72 hours. Lipid Profile No results for input(s): CHOL, HDL, LDLCALC, TRIG, CHOLHDL, LDLDIRECT in the last 72 hours. Thyroid function studies No results for input(s): TSH, T4TOTAL, T3FREE, THYROIDAB in the last 72 hours.  Invalid input(s): FREET3 Anemia work up No results for input(s): VITAMINB12, FOLATE, FERRITIN, TIBC, IRON, RETICCTPCT in the last 72 hours. Urinalysis No results found for: COLORURINE, APPEARANCEUR, LABSPEC, [place], GLUCOSEU, HGBUR, BILIRUBINUR, KETONESUR, PROTEINUR, UROBILINOGEN, NITRITE, LEUKOCYTESUR Sepsis Labs Invalid input(s): PROCALCITONIN,  WBC,  LACTICIDVEN Microbiology No results found for this or any previous visit (from the past 240 hour(s)).  Time spent: 30 min  SIGNED:   Marylu Lund, MD  [facility] 01/01/2018, 8:48 AM  If 7PM-7AM, please contact night-coverage

## 2018-01-01 NOTE — Care Management (Signed)
Discussed with on call nurse for [facility] and DC summary faxed.  (Orders and notes already sent).  [place] services will resume tomorrow.

## 2018-01-01 NOTE — Progress Notes (Signed)
Occupational Therapy Treatment Patient Details Name: Latasha Guzman MRN: [id number] DOB: 1945/12/21 Today's Date: 01/01/2018    History of present illness Pt is a 72 y/o female admitted secondary to worsening LE swelling. Found to have a fib with RVR as well and anemia of unknown source. Pt likley for capsule study on 11/8. PMH includes DM, HTN, a fib, dCHF and s/p pacemaker.    OT comments  Pt. Eager for d/c home scheduled for today.  Agreeable to participation in skilled OT.  Able to return demo for amb. To/from b.room with simulated toileting task.  Cues for RW management.  Reports [place] assistance in place along with assistance from her niece.    Follow Up Recommendations  Home health OT    Equipment Recommendations       Recommendations for Other Services      Precautions / Restrictions Precautions Precautions: Fall Restrictions Weight Bearing Restrictions: No       Mobility Bed Mobility               General bed mobility comments: seated in chair at beginning and end of session  Transfers Overall transfer level: Needs assistance Equipment used: Rolling walker (2 wheeled) Transfers: Sit to/from [facility] Sit to Stand: Min guard Stand pivot transfers: Min guard       General transfer comment: increased time cues for RW safety while transitioning into standing attempted to pull on RW and it came back towards her with notable post. LOB. when i explained this she said "i know" and changed the subject, not implementing the tech. i was suggesting    Balance                                           ADL either performed or assessed with clinical judgement   ADL Overall ADL's : Needs assistance/impaired     Grooming: Supervision/safety;Standing                 Lower Body Dressing Details (indicate cue type and reason): reports she does not get "fully" dressed unless she has an md apt. and in that case her niece is there  to drive her so she helps with LB dressing Toilet Transfer: Min guard;RW;Ambulation;BSC;Comfort height toilet Toilet Transfer Details (indicate cue type and reason): reports she has 3n1 over her commode at home       Tub/Shower Transfer Details (indicate cue type and reason): states she has been sponge bathing since may 2019 due to multiple sx.s that requiring her being "wrapped up" Functional mobility during ADLs: Min guard;Rolling walker       Vision       Perception     Praxis      Cognition Arousal/Alertness: Awake/alert Behavior During Therapy: WFL for tasks assessed/performed Overall Cognitive Status: Within Functional Limits for tasks assessed                                          Exercises     Shoulder Instructions       General Comments      Pertinent Vitals/ Pain       Pain Assessment: No/denies pain  Home Living  Prior Functioning/Environment              Frequency  Min 2X/week        Progress Toward Goals  OT Goals(current goals can now be found in the care plan section)  Progress towards OT goals: Progressing toward goals     Plan      Co-evaluation                 AM-PAC PT "6 Clicks" Daily Activity     Outcome Measure   Help from another person eating meals?: None Help from another person taking care of personal grooming?: A Little Help from another person toileting, which includes using toliet, bedpan, or urinal?: A Lot Help from another person bathing (including washing, rinsing, drying)?: A Little Help from another person to put on and taking off regular upper body clothing?: None Help from another person to put on and taking off regular lower body clothing?: A Little 6 Click Score: 19    End of Session Equipment Utilized During Treatment: Rolling walker  OT Visit Diagnosis: Unsteadiness on feet (R26.81);Other abnormalities of gait and  mobility (R26.89);Pain;Muscle weakness (generalized) (M62.81)   Activity Tolerance Patient tolerated treatment well   Patient Left in chair   Nurse Communication          Time: 7425-9563 OT Time Calculation (min): 11 min  Charges: OT General Charges $OT Visit: 1 Visit OT Treatments $Self Care/Home Management : 8-22 mins   Janice Coffin, COTA/L 01/01/2018, 11:12 AM

## 2018-01-18 ENCOUNTER — Ambulatory Visit (INDEPENDENT_AMBULATORY_CARE_PROVIDER_SITE_OTHER): Payer: 59 | Admitting: Sports Medicine

## 2018-01-18 ENCOUNTER — Telehealth: Payer: Self-pay | Admitting: *Deleted

## 2018-01-18 ENCOUNTER — Encounter: Payer: Self-pay | Admitting: Sports Medicine

## 2018-01-18 VITALS — BP 118/70 | HR 81 | Resp 16

## 2018-01-18 DIAGNOSIS — L97511 Non-pressure chronic ulcer of other part of right foot limited to breakdown of skin: Secondary | ICD-10-CM | POA: Diagnosis not present

## 2018-01-18 DIAGNOSIS — L03031 Cellulitis of right toe: Secondary | ICD-10-CM

## 2018-01-18 DIAGNOSIS — M79675 Pain in left toe(s): Secondary | ICD-10-CM | POA: Diagnosis not present

## 2018-01-18 DIAGNOSIS — L97521 Non-pressure chronic ulcer of other part of left foot limited to breakdown of skin: Secondary | ICD-10-CM

## 2018-01-18 DIAGNOSIS — I890 Lymphedema, not elsewhere classified: Secondary | ICD-10-CM

## 2018-01-18 DIAGNOSIS — M79674 Pain in right toe(s): Secondary | ICD-10-CM

## 2018-01-18 DIAGNOSIS — B351 Tinea unguium: Secondary | ICD-10-CM | POA: Diagnosis not present

## 2018-01-18 DIAGNOSIS — E1142 Type 2 diabetes mellitus with diabetic polyneuropathy: Secondary | ICD-10-CM | POA: Diagnosis not present

## 2018-01-18 DIAGNOSIS — I739 Peripheral vascular disease, unspecified: Secondary | ICD-10-CM

## 2018-01-18 DIAGNOSIS — D689 Coagulation defect, unspecified: Secondary | ICD-10-CM

## 2018-01-18 MED ORDER — DOXYCYCLINE HYCLATE 100 MG PO TABS: 100.0000 mg | ORAL_TABLET | Freq: Two times a day (BID) | ORAL | 0 refills | Status: DC

## 2018-01-18 NOTE — Telephone Encounter (Signed)
-----   Message from [place], [place] sent at 01/18/2018  2:32 PM EST ----- Regarding: Wound care orders [facility] Apply silvadene cream and dry dressing every other day to both 1st toe ulcers and right 1st toe nail bed

## 2018-01-18 NOTE — Progress Notes (Signed)
Subjective: Latasha Guzman is a 72 y.o. female patient with history of diabetes who presents to office today complaining of long,mildly painful nails.  Patient also admits that she has been having clear drainage and swelling and redness coming from her right greater than her left toe states that she has been going to the wound care center where they have been taking care of it think it and reports that last visit the doctor cultured it and prescribed amoxicillin which she completed.  Patient denies nausea vomiting fever chills and reports that she thinks that her right great toenail is loose.  No other acute issues noted.  Patient Active Problem List   Diagnosis Date Noted  . A-fib ([place]) 12/27/2017  . Atrial fibrillation with RVR ([place]) 12/27/2017  . Chronic diastolic CHF (congestive heart failure) ([place]) 12/27/2017  . Lymphedema of both lower extremities 12/27/2017  . Hyperlipidemia associated with type 2 diabetes mellitus ([place]) 12/27/2017  . Acute blood loss anemia 08/02/2017  . Pneumonia of right lower lobe due to infectious organism ([place]) 08/02/2017  . Skin rash 08/02/2017  . Diabetes mellitus ([place]) 07/27/2017  . GERD (gastroesophageal reflux disease) 07/27/2017  . History of GI bleed 07/24/2017  . Chronic atrial fibrillation 10/15/2015  . Essential hypertension 10/15/2015  . Pacemaker 10/15/2015  . Type 2 diabetes mellitus without complication ([place]) 26/94/8546   Current Outpatient Medications on File Prior to Visit  Medication Sig Dispense Refill  . apixaban (ELIQUIS) 5 MG TABS tablet Take 10 mg by mouth every evening.     Marland Kitchen atorvastatin (LIPITOR) 80 MG tablet Take 80 mg by mouth at bedtime.     Marland Kitchen diltiazem (CARDIZEM) 90 MG tablet Take 1 tablet (90 mg total) by mouth every 6 (six) hours. 120 tablet 0  . fenofibrate 160 MG tablet Take 160 mg by mouth every morning.     . furosemide (LASIX) 80 MG tablet Take 80 mg by mouth every morning.     Marland Kitchen omeprazole (PRILOSEC) 20 MG capsule Take 20  mg by mouth every morning.     Marland Kitchen PROAIR HFA 108 (90 Base) MCG/ACT inhaler Inhale 2 puffs into the lungs every 6 (six) hours as needed for wheezing or shortness of breath.      No current facility-administered medications on file prior to visit.    Allergies  Allergen Reactions  . Iodine Hives, Other (See Comments) and Shortness Of Breath    Throat swells  . Iodinated Diagnostic Agents   . Penicillins Hives    Has patient had a PCN reaction causing immediate rash, facial/tongue/throat swelling, SOB or lightheadedness with hypotension: Yes Has patient had a PCN reaction causing severe rash involving mucus membranes or skin necrosis: No Has patient had a PCN reaction that required hospitalization: No Has patient had a PCN reaction occurring within the last 10 years: No If all of the above answers are "NO", then may proceed with Cephalosporin use.     Recent Results (from the past 2160 hour(s))  CBC with Differential     Status: Abnormal   Collection Time: 12/27/17 12:58 PM  Result Value Ref Range   WBC 6.6 4.0 - 10.5 K/uL   RBC 3.31 (L) 3.87 - 5.11 MIL/uL   Hemoglobin 9.0 (L) 12.0 - 15.0 g/dL   HCT 31.0 (L) 36.0 - 46.0 %   MCV 93.7 80.0 - 100.0 fL   MCH 27.2 26.0 - 34.0 pg   MCHC 29.0 (L) 30.0 - 36.0 g/dL   RDW 14.4 11.5 -  15.5 %   Platelets 224 150 - 400 K/uL   nRBC 0.0 0.0 - 0.2 %   Neutrophils Relative % 76 %   Neutro Abs 4.9 1.7 - 7.7 K/uL   Lymphocytes Relative 9 %   Lymphs Abs 0.6 (L) 0.7 - 4.0 K/uL   Monocytes Relative 11 %   Monocytes Absolute 0.8 0.1 - 1.0 K/uL   Eosinophils Relative 3 %   Eosinophils Absolute 0.2 0.0 - 0.5 K/uL   Basophils Relative 1 %   Basophils Absolute 0.1 0.0 - 0.1 K/uL   Immature Granulocytes 0 %   Abs Immature Granulocytes 0.02 0.00 - 0.07 K/uL    Comment: Performed at [facility], [address] [place], [place] [address]  Basic metabolic panel     Status: Abnormal   Collection Time: 12/27/17 12:58 PM  Result Value Ref Range    Sodium 139 135 - 145 mmol/L   Potassium 3.6 3.5 - 5.1 mmol/L   Chloride 102 98 - 111 mmol/L   CO2 26 22 - 32 mmol/L   Glucose, Bld 127 (H) 70 - 99 mg/dL   BUN 18 8 - 23 mg/dL   Creatinine, Ser 1.14 (H) 0.44 - 1.00 mg/dL   Calcium 9.4 8.9 - 10.3 mg/dL   GFR calc non Af Amer 47 (L) >60 mL/min   GFR calc Af Amer 55 (L) >60 mL/min    Comment: (NOTE) The eGFR has been calculated using the CKD EPI equation. This calculation has not been validated in all clinical situations. eGFR's persistently <60 mL/min signify possible Chronic Kidney Disease.    Anion gap 11 5 - 15    Comment: Performed at [facility], [address] [place], [place] [address]  Troponin I     Status: Abnormal   Collection Time: 12/27/17 12:58 PM  Result Value Ref Range   Troponin I 0.03 (HH) <0.03 ng/mL    Comment: CRITICAL RESULT CALLED TO, READ BACK BY AND VERIFIED WITH: CINDY REED RN '@[id number]'  12/27/2017 OLSONM Performed at [facility], [address] [place], [place] [address]   Brain natriuretic peptide     Status: Abnormal   Collection Time: 12/27/17 12:58 PM  Result Value Ref Range   B Natriuretic Peptide 540.2 (H) 0.0 - 100.0 pg/mL    Comment: Performed at [facility], [address] [place], [place] [address]  CBC     Status: Abnormal   Collection Time: 12/28/17  4:34 AM  Result Value Ref Range   WBC 6.8 4.0 - 10.5 K/uL   RBC 2.90 (L) 3.87 - 5.11 MIL/uL   Hemoglobin 7.8 (L) 12.0 - 15.0 g/dL   HCT 26.4 (L) 36.0 - 46.0 %   MCV 91.0 80.0 - 100.0 fL   MCH 26.9 26.0 - 34.0 pg   MCHC 29.5 (L) 30.0 - 36.0 g/dL   RDW 14.5 11.5 - 15.5 %   Platelets 235 150 - 400 K/uL   nRBC 0.0 0.0 - 0.2 %    Comment: Performed at [facility], [place] [address], [place], [place] [address]  Basic metabolic panel     Status: Abnormal   Collection Time: 12/28/17  4:34 AM  Result Value Ref Range   Sodium 139 135 - 145 mmol/L   Potassium 3.1 (L) 3.5 - 5.1 mmol/L   Chloride 106 98 -  111 mmol/L   CO2 28 22 - 32 mmol/L   Glucose, Bld  107 (H) 70 - 99 mg/dL   BUN 14 8 - 23 mg/dL   Creatinine, Ser 1.19 (H) 0.44 - 1.00 mg/dL   Calcium 9.1 8.9 - 10.3 mg/dL   GFR calc non Af Amer 45 (L) >60 mL/min   GFR calc Af Amer 52 (L) >60 mL/min    Comment: (NOTE) The eGFR has been calculated using the CKD EPI equation. This calculation has not been validated in all clinical situations. eGFR's persistently <60 mL/min signify possible Chronic Kidney Disease.    Anion gap 5 5 - 15    Comment: Performed at [facility] [address], [place], [place] [address]  Magnesium     Status: None   Collection Time: 12/28/17  4:34 AM  Result Value Ref Range   Magnesium 1.9 1.7 - 2.4 mg/dL    Comment: Performed at [facility] [address] [place], [place] [address]  Hemoglobin A1c     Status: Abnormal   Collection Time: 12/28/17  4:34 AM  Result Value Ref Range   Hgb A1c MFr Bld 4.2 (L) 4.8 - 5.6 %    Comment: (NOTE) Pre diabetes:          5.7%-6.4% Diabetes:              >6.4% Glycemic control for   <7.0% adults with diabetes    Mean Plasma Glucose 73.84 mg/dL    Comment: Performed at [facility] [address], [place], [place] [address]  Troponin I     Status: None   Collection Time: 12/28/17  4:34 AM  Result Value Ref Range   Troponin I <0.03 <0.03 ng/mL    Comment: Performed at [facility] [address], [place], [place] [address]  Glucose, capillary     Status: Abnormal   Collection Time: 12/28/17  7:51 AM  Result Value Ref Range   Glucose-Capillary 102 (H) 70 - 99 mg/dL  Glucose, capillary     Status: Abnormal   Collection Time: 12/28/17 12:12 PM  Result Value Ref Range   Glucose-Capillary 122 (H) 70 - 99 mg/dL  Hemoglobin and hematocrit, blood     Status: Abnormal   Collection Time: 12/28/17  2:19 PM  Result Value Ref Range   Hemoglobin 8.0 (L) 12.0 - 15.0 g/dL   HCT 27.4 (L) 36.0 - 46.0 %    Comment: Performed at [facility], [place] [address] [place],  [address]  ECHOCARDIOGRAM COMPLETE     Status: None   Collection Time: 12/28/17  4:04 PM  Result Value Ref Range   Weight 4,000 oz   Height 64.500 in   BP 111/87 mmHg  Glucose, capillary     Status: Abnormal   Collection Time: 12/28/17  4:21 PM  Result Value Ref Range   Glucose-Capillary 112 (H) 70 - 99 mg/dL  Glucose, capillary     Status: Abnormal   Collection Time: 12/28/17 10:17 PM  Result Value Ref Range   Glucose-Capillary 110 (H) 70 - 99 mg/dL  Comprehensive metabolic panel     Status: Abnormal   Collection Time: 12/29/17  3:58 AM  Result Value Ref Range   Sodium 138 135 - 145 mmol/L   Potassium 4.1 3.5 - 5.1 mmol/L   Chloride 107 98 - 111 mmol/L   CO2 23 22 - 32 mmol/L   Glucose, Bld 97 70 - 99 mg/dL   BUN 16 8 - 23 mg/dL   Creatinine, Ser  1.21 (H) 0.44 - 1.00 mg/dL   Calcium 9.0 8.9 - 10.3 mg/dL   Total Protein 5.4 (L) 6.5 - 8.1 g/dL   Albumin 2.5 (L) 3.5 - 5.0 g/dL   AST 31 15 - 41 U/L   ALT 7 0 - 44 U/L   Alkaline Phosphatase 49 38 - 126 U/L   Total Bilirubin 1.5 (H) 0.3 - 1.2 mg/dL   GFR calc non Af Amer 44 (L) >60 mL/min   GFR calc Af Amer 51 (L) >60 mL/min    Comment: (NOTE) The eGFR has been calculated using the CKD EPI equation. This calculation has not been validated in all clinical situations. eGFR's persistently <60 mL/min signify possible Chronic Kidney Disease.    Anion gap 8 5 - 15    Comment: Performed at [facility] [address], [place], [place] [address]  CBC     Status: Abnormal   Collection Time: 12/29/17  3:58 AM  Result Value Ref Range   WBC 5.8 4.0 - 10.5 K/uL   RBC 2.86 (L) 3.87 - 5.11 MIL/uL   Hemoglobin 7.7 (L) 12.0 - 15.0 g/dL   HCT 26.1 (L) 36.0 - 46.0 %   MCV 91.3 80.0 - 100.0 fL   MCH 26.9 26.0 - 34.0 pg   MCHC 29.5 (L) 30.0 - 36.0 g/dL   RDW 14.3 11.5 - 15.5 %   Platelets 216 150 - 400 K/uL   nRBC 0.0 0.0 - 0.2 %    Comment: Performed at [facility], [place] [address],  [place], [place] [address]  Glucose, capillary     Status: Abnormal   Collection Time: 12/29/17 12:02 PM  Result Value Ref Range   Glucose-Capillary 112 (H) 70 - 99 mg/dL  Glucose, capillary     Status: None   Collection Time: 12/29/17  4:39 PM  Result Value Ref Range   Glucose-Capillary 95 70 - 99 mg/dL  Glucose, capillary     Status: None   Collection Time: 12/29/17  9:33 PM  Result Value Ref Range   Glucose-Capillary 97 70 - 99 mg/dL  Basic metabolic panel     Status: Abnormal   Collection Time: 12/30/17  3:17 AM  Result Value Ref Range   Sodium 139 135 - 145 mmol/L   Potassium 3.6 3.5 - 5.1 mmol/L   Chloride 105 98 - 111 mmol/L   CO2 28 22 - 32 mmol/L   Glucose, Bld 106 (H) 70 - 99 mg/dL   BUN 12 8 - 23 mg/dL   Creatinine, Ser 1.40 (H) 0.44 - 1.00 mg/dL   Calcium 9.2 8.9 - 10.3 mg/dL   GFR calc non Af Amer 37 (L) >60 mL/min   GFR calc Af Amer 43 (L) >60 mL/min    Comment: (NOTE) The eGFR has been calculated using the CKD EPI equation. This calculation has not been validated in all clinical situations. eGFR's persistently <60 mL/min signify possible Chronic Kidney Disease.    Anion gap 6 5 - 15    Comment: Performed at [facility] [address], [place] [address]  CBC     Status: Abnormal   Collection Time: 12/30/17  3:17 AM  Result Value Ref Range   WBC 5.8 4.0 - 10.5 K/uL   RBC 2.83 (L) 3.87 - 5.11 MIL/uL   Hemoglobin 7.7 (L) 12.0 - 15.0 g/dL   HCT 25.9 (L) 36.0 - 46.0 %   MCV 91.5 80.0 - 100.0 fL  MCH 27.2 26.0 - 34.0 pg   MCHC 29.7 (L) 30.0 - 36.0 g/dL   RDW 14.3 11.5 - 15.5 %   Platelets 226 150 - 400 K/uL   nRBC 0.0 0.0 - 0.2 %    Comment: Performed at [facility], [place] [address] [place], [place] [address]  Glucose, capillary     Status: None   Collection Time: 12/30/17  8:05 AM  Result Value Ref Range   Glucose-Capillary 90 70 - 99 mg/dL  Glucose, capillary     Status: None   Collection Time: 12/30/17 11:23 AM  Result Value Ref  Range   Glucose-Capillary 85 70 - 99 mg/dL  Glucose, capillary     Status: None   Collection Time: 12/30/17  5:06 PM  Result Value Ref Range   Glucose-Capillary 93 70 - 99 mg/dL  Glucose, capillary     Status: Abnormal   Collection Time: 12/30/17  9:41 PM  Result Value Ref Range   Glucose-Capillary 133 (H) 70 - 99 mg/dL  Basic metabolic panel     Status: Abnormal   Collection Time: 12/31/17  3:32 AM  Result Value Ref Range   Sodium 137 135 - 145 mmol/L   Potassium 4.0 3.5 - 5.1 mmol/L   Chloride 105 98 - 111 mmol/L   CO2 24 22 - 32 mmol/L   Glucose, Bld 85 70 - 99 mg/dL   BUN 17 8 - 23 mg/dL   Creatinine, Ser 1.39 (H) 0.44 - 1.00 mg/dL   Calcium 8.8 (L) 8.9 - 10.3 mg/dL   GFR calc non Af Amer 37 (L) >60 mL/min   GFR calc Af Amer 43 (L) >60 mL/min    Comment: (NOTE) The eGFR has been calculated using the CKD EPI equation. This calculation has not been validated in all clinical situations. eGFR's persistently <60 mL/min signify possible Chronic Kidney Disease.    Anion gap 8 5 - 15    Comment: Performed at [facility] [address] [place], [place] [address]  CBC     Status: Abnormal   Collection Time: 12/31/17  3:32 AM  Result Value Ref Range   WBC 6.2 4.0 - 10.5 K/uL   RBC 2.91 (L) 3.87 - 5.11 MIL/uL   Hemoglobin 7.7 (L) 12.0 - 15.0 g/dL   HCT 26.8 (L) 36.0 - 46.0 %   MCV 92.1 80.0 - 100.0 fL   MCH 26.5 26.0 - 34.0 pg   MCHC 28.7 (L) 30.0 - 36.0 g/dL   RDW 14.4 11.5 - 15.5 %   Platelets 259 150 - 400 K/uL   nRBC 0.0 0.0 - 0.2 %    Comment: Performed at [facility], [place] [address] [place],  [address]  Glucose, capillary     Status: None   Collection Time: 12/31/17  7:31 AM  Result Value Ref Range   Glucose-Capillary 97 70 - 99 mg/dL  Glucose, capillary     Status: Abnormal   Collection Time: 12/31/17 11:21 AM  Result Value Ref Range   Glucose-Capillary 101 (H) 70 - 99 mg/dL  Glucose, capillary     Status: None   Collection Time: 12/31/17   4:20 PM  Result Value Ref Range   Glucose-Capillary 93 70 - 99 mg/dL  Glucose, capillary     Status: Abnormal   Collection Time: 12/31/17  9:03 PM  Result Value Ref Range   Glucose-Capillary 102 (H) 70 - 99 mg/dL  CBC     Status:  Abnormal   Collection Time: 01/01/18  4:05 AM  Result Value Ref Range   WBC 5.7 4.0 - 10.5 K/uL   RBC 3.05 (L) 3.87 - 5.11 MIL/uL   Hemoglobin 8.2 (L) 12.0 - 15.0 g/dL   HCT 27.5 (L) 36.0 - 46.0 %   MCV 90.2 80.0 - 100.0 fL   MCH 26.9 26.0 - 34.0 pg   MCHC 29.8 (L) 30.0 - 36.0 g/dL   RDW 14.3 11.5 - 15.5 %   Platelets 258 150 - 400 K/uL   nRBC 0.0 0.0 - 0.2 %    Comment: Performed at [facility], [place] [address] [place], [place] [address]  Glucose, capillary     Status: None   Collection Time: 01/01/18  7:27 AM  Result Value Ref Range   Glucose-Capillary 92 70 - 99 mg/dL  Basic metabolic panel     Status: Abnormal   Collection Time: 01/01/18  7:32 AM  Result Value Ref Range   Sodium 139 135 - 145 mmol/L   Potassium 4.0 3.5 - 5.1 mmol/L   Chloride 105 98 - 111 mmol/L   CO2 28 22 - 32 mmol/L   Glucose, Bld 98 70 - 99 mg/dL   BUN 16 8 - 23 mg/dL   Creatinine, Ser 1.31 (H) 0.44 - 1.00 mg/dL   Calcium 9.2 8.9 - 10.3 mg/dL   GFR calc non Af Amer 40 (L) >60 mL/min   GFR calc Af Amer 46 (L) >60 mL/min    Comment: (NOTE) The eGFR has been calculated using the CKD EPI equation. This calculation has not been validated in all clinical situations. eGFR's persistently <60 mL/min signify possible Chronic Kidney Disease.    Anion gap 6 5 - 15    Comment: Performed at [facility] [address], [place], [place] [address]    Objective: General: Patient is awake, alert, and oriented x 3 and in no acute distress.  Integument: Skin is warm, dry and supple bilateral. Nails are tender, long, thickened and  dystrophic with subungual debris, consistent with onychomycosis, 1-5 bilateral with lysis and drainage coming from right 1st toenail. There is  a hyper granular ulceration present at the plantar aspect of the right first toe that measures less than 1 cm with active bleeding drainage no malodor no warmth no redness no other acute signs of infection.  There is also a partial-thickness ulceration noted to the plantar medial aspect of the left hallux that measures less than 0.5 cm with a granular base and no surrounding acute signs of infection.  Vasculature:  Dorsalis Pedis pulse 0/4 bilateral. Posterior Tibial pulse  0/4 bilateral due to chronic edema. Capillary fill time <3 sec 1-5 bilateral.  No hair growth to the level of the digits. Temperature gradient within normal limits.  There is chronic venous stasis-like changes consistent with lymphedema.  Neurology: The patient has diminished absent measured with a 5.07/10g Semmes Weinstein Monofilament at all pedal sites bilateral. Vibratory sensation diminished bilateral with tuning fork. No Babinski sign present bilateral.   Musculoskeletal: Asymptomatic bunion, pes planus and hammertoe pedal deformities noted bilateral. Muscular strength 4/5 in all lower extremity muscular groups bilateral without pain on range of motion . No tenderness with calf compression bilateral or to palpation to ulcerated areas bilateral.  Assessment and Plan: Problem List Items Addressed This Visit    None    Visit Diagnoses    Toe ulcer, left, limited to breakdown of skin ([place])    -  Primary   Toe ulcer, right, limited to breakdown of skin ([place])       Paronychia of great toe of right foot       Pain due to onychomycosis of toenails of both feet       Diabetic polyneuropathy associated with type 2 diabetes mellitus ([place])       PVD (peripheral vascular disease) ([place])       Lymphedema       Coagulopathy ([place])         -Examined patient. -Discussed and educated patient on diabetic foot care, especially with  regards to the vascular, neurological and musculoskeletal systems.  -Mechanically debrided all nails  1-5 bilateral using sterile nail nipper and gently debrided the right loose great toenail and applied Silvadene cream to the bleeding nailbed - Excisionally dedbrided ulceration at bilateral first toes to healthy bleeding borders removing nonviable tissue using a sterile chisel blade. Wound measures post debridement as above. Wound was debrided to the level of the dermis with viable wound base exposed to promote healing. Hemostasis was achieved with manuel pressure. Patient tolerated procedure well without any discomfort or anesthesia necessary for this wound debridement.  -Applied Silvadene cream and dry sterile dressing and instructed patient to continue with daily dressings at home consisting of same with assistance from nurses also advised her to have the right great toenail bed and dressed as well with Silvadene cream - Advised patient to go to the ER or return to office if the wound worsens or if constitutional symptoms are present. -Advised patient to be aware of how she is using her edema pump and to be sure that it is not aggravating her big toes  -Patient to return 2 weeks for follow-up wound care bilateral hallux -Patient advised to call the office if any problems or questions arise in the meantime.   Landis Martins, DPM

## 2018-01-18 NOTE — Telephone Encounter (Signed)
Faxed Dr. Cannon Kettle 01/18/2018 2:32pm orders to [facility].

## 2018-01-20 ENCOUNTER — Telehealth: Payer: Self-pay | Admitting: Podiatry

## 2018-01-20 NOTE — Telephone Encounter (Signed)
Latasha Guzman from home health called to say her prescription for Silvadene was not called in by Dr. Cannon Kettle office.Since it was  late and the pharmacy was closed she was given my permission to obtain the silvadene at another pharmacy.   Gardiner Barefoot DPM

## 2018-01-30 ENCOUNTER — Encounter: Payer: 59 | Admitting: Cardiology

## 2018-01-30 DIAGNOSIS — N3000 Acute cystitis without hematuria: Secondary | ICD-10-CM | POA: Diagnosis not present

## 2018-01-30 DIAGNOSIS — B3749 Other urogenital candidiasis: Secondary | ICD-10-CM

## 2018-01-30 DIAGNOSIS — E872 Acidosis: Secondary | ICD-10-CM | POA: Diagnosis not present

## 2018-01-30 DIAGNOSIS — A419 Sepsis, unspecified organism: Secondary | ICD-10-CM | POA: Diagnosis not present

## 2018-01-30 DIAGNOSIS — N179 Acute kidney failure, unspecified: Secondary | ICD-10-CM | POA: Diagnosis not present

## 2018-01-31 DIAGNOSIS — E872 Acidosis: Secondary | ICD-10-CM | POA: Diagnosis not present

## 2018-01-31 DIAGNOSIS — A419 Sepsis, unspecified organism: Secondary | ICD-10-CM | POA: Diagnosis not present

## 2018-01-31 DIAGNOSIS — N3000 Acute cystitis without hematuria: Secondary | ICD-10-CM | POA: Diagnosis not present

## 2018-01-31 DIAGNOSIS — N179 Acute kidney failure, unspecified: Secondary | ICD-10-CM | POA: Diagnosis not present

## 2018-02-01 ENCOUNTER — Ambulatory Visit: Payer: 59 | Admitting: Sports Medicine

## 2018-02-01 DIAGNOSIS — N179 Acute kidney failure, unspecified: Secondary | ICD-10-CM | POA: Diagnosis not present

## 2018-02-01 DIAGNOSIS — E872 Acidosis: Secondary | ICD-10-CM | POA: Diagnosis not present

## 2018-02-01 DIAGNOSIS — N3000 Acute cystitis without hematuria: Secondary | ICD-10-CM | POA: Diagnosis not present

## 2018-02-01 DIAGNOSIS — A419 Sepsis, unspecified organism: Secondary | ICD-10-CM | POA: Diagnosis not present

## 2018-02-02 DIAGNOSIS — N3000 Acute cystitis without hematuria: Secondary | ICD-10-CM | POA: Diagnosis not present

## 2018-02-02 DIAGNOSIS — A419 Sepsis, unspecified organism: Secondary | ICD-10-CM | POA: Diagnosis not present

## 2018-02-02 DIAGNOSIS — E872 Acidosis: Secondary | ICD-10-CM | POA: Diagnosis not present

## 2018-02-02 DIAGNOSIS — N179 Acute kidney failure, unspecified: Secondary | ICD-10-CM | POA: Diagnosis not present

## 2018-02-03 DIAGNOSIS — E872 Acidosis: Secondary | ICD-10-CM | POA: Diagnosis not present

## 2018-02-03 DIAGNOSIS — A419 Sepsis, unspecified organism: Secondary | ICD-10-CM | POA: Diagnosis not present

## 2018-02-03 DIAGNOSIS — N179 Acute kidney failure, unspecified: Secondary | ICD-10-CM | POA: Diagnosis not present

## 2018-02-03 DIAGNOSIS — N3000 Acute cystitis without hematuria: Secondary | ICD-10-CM | POA: Diagnosis not present

## 2018-02-13 ENCOUNTER — Ambulatory Visit: Payer: 59 | Admitting: Cardiology

## 2018-03-03 ENCOUNTER — Inpatient Hospital Stay
Admission: AD | Admit: 2018-03-03 | Payer: Self-pay | Source: Other Acute Inpatient Hospital | Admitting: Internal Medicine

## 2018-03-03 NOTE — Progress Notes (Signed)
Latasha Guzman is a 73 year old white female transferring from [facility] to [facility].  Patient presented from rehab center to [facility] with new shortness of breath.  She was found to have a hemoglobin of 6.7 down from 9.2 with melanotic stool while on Eliquis for atrial fibrillation.  She was also found to have chest x-ray with cardiomegaly and mild vascular congestion.  Patient is hemodynamically stable with blood pressure of 119/70, pulse 104, respirations of 18, O2 sats 94 to 97%.  She was noted have a mildly elevated white count to 23.5 with a UA that is pending and a chest x-ray that is negative for acute infection.  There was no other obvious source of note her INR is 1.8.  She will need GI consultation upon arrival

## 2018-03-04 ENCOUNTER — Inpatient Hospital Stay (HOSPITAL_COMMUNITY): Payer: 59

## 2018-03-04 ENCOUNTER — Other Ambulatory Visit: Payer: Self-pay

## 2018-03-04 ENCOUNTER — Inpatient Hospital Stay (HOSPITAL_COMMUNITY)
Admission: AD | Admit: 2018-03-04 | Discharge: 2018-03-27 | DRG: 802 | Disposition: A | Discharge status: Skilled Nursing Facility | Payer: 59 | Source: Other Acute Inpatient Hospital | Attending: Internal Medicine | Admitting: Internal Medicine

## 2018-03-04 ENCOUNTER — Encounter (HOSPITAL_COMMUNITY): Payer: Self-pay | Admitting: Internal Medicine

## 2018-03-04 DIAGNOSIS — I10 Essential (primary) hypertension: Secondary | ICD-10-CM | POA: Diagnosis present

## 2018-03-04 DIAGNOSIS — D638 Anemia in other chronic diseases classified elsewhere: Secondary | ICD-10-CM | POA: Diagnosis not present

## 2018-03-04 DIAGNOSIS — Z880 Allergy status to penicillin: Secondary | ICD-10-CM

## 2018-03-04 DIAGNOSIS — I493 Ventricular premature depolarization: Secondary | ICD-10-CM | POA: Diagnosis present

## 2018-03-04 DIAGNOSIS — I130 Hypertensive heart and chronic kidney disease with heart failure and stage 1 through stage 4 chronic kidney disease, or unspecified chronic kidney disease: Secondary | ICD-10-CM | POA: Diagnosis present

## 2018-03-04 DIAGNOSIS — R0602 Shortness of breath: Secondary | ICD-10-CM | POA: Diagnosis not present

## 2018-03-04 DIAGNOSIS — Z8701 Personal history of pneumonia (recurrent): Secondary | ICD-10-CM

## 2018-03-04 DIAGNOSIS — L89153 Pressure ulcer of sacral region, stage 3: Secondary | ICD-10-CM | POA: Diagnosis present

## 2018-03-04 DIAGNOSIS — I5033 Acute on chronic diastolic (congestive) heart failure: Secondary | ICD-10-CM | POA: Diagnosis present

## 2018-03-04 DIAGNOSIS — R338 Other retention of urine: Secondary | ICD-10-CM | POA: Diagnosis not present

## 2018-03-04 DIAGNOSIS — K922 Gastrointestinal hemorrhage, unspecified: Secondary | ICD-10-CM | POA: Diagnosis present

## 2018-03-04 DIAGNOSIS — K921 Melena: Secondary | ICD-10-CM | POA: Diagnosis present

## 2018-03-04 DIAGNOSIS — Z8249 Family history of ischemic heart disease and other diseases of the circulatory system: Secondary | ICD-10-CM

## 2018-03-04 DIAGNOSIS — K7460 Unspecified cirrhosis of liver: Secondary | ICD-10-CM | POA: Diagnosis present

## 2018-03-04 DIAGNOSIS — E876 Hypokalemia: Secondary | ICD-10-CM | POA: Diagnosis not present

## 2018-03-04 DIAGNOSIS — E785 Hyperlipidemia, unspecified: Secondary | ICD-10-CM | POA: Diagnosis present

## 2018-03-04 DIAGNOSIS — I272 Pulmonary hypertension, unspecified: Secondary | ICD-10-CM | POA: Diagnosis present

## 2018-03-04 DIAGNOSIS — E0801 Diabetes mellitus due to underlying condition with hyperosmolarity with coma: Secondary | ICD-10-CM | POA: Diagnosis not present

## 2018-03-04 DIAGNOSIS — K7469 Other cirrhosis of liver: Secondary | ICD-10-CM | POA: Diagnosis not present

## 2018-03-04 DIAGNOSIS — Z833 Family history of diabetes mellitus: Secondary | ICD-10-CM

## 2018-03-04 DIAGNOSIS — E1122 Type 2 diabetes mellitus with diabetic chronic kidney disease: Secondary | ICD-10-CM | POA: Diagnosis present

## 2018-03-04 DIAGNOSIS — R188 Other ascites: Secondary | ICD-10-CM | POA: Diagnosis present

## 2018-03-04 DIAGNOSIS — Z90710 Acquired absence of both cervix and uterus: Secondary | ICD-10-CM

## 2018-03-04 DIAGNOSIS — I890 Lymphedema, not elsewhere classified: Secondary | ICD-10-CM | POA: Diagnosis not present

## 2018-03-04 DIAGNOSIS — Z87440 Personal history of urinary (tract) infections: Secondary | ICD-10-CM

## 2018-03-04 DIAGNOSIS — N183 Chronic kidney disease, stage 3 (moderate): Secondary | ICD-10-CM | POA: Diagnosis present

## 2018-03-04 DIAGNOSIS — Z7901 Long term (current) use of anticoagulants: Secondary | ICD-10-CM

## 2018-03-04 DIAGNOSIS — K5790 Diverticulosis of intestine, part unspecified, without perforation or abscess without bleeding: Secondary | ICD-10-CM | POA: Diagnosis present

## 2018-03-04 DIAGNOSIS — I5031 Acute diastolic (congestive) heart failure: Secondary | ICD-10-CM | POA: Diagnosis present

## 2018-03-04 DIAGNOSIS — Z9849 Cataract extraction status, unspecified eye: Secondary | ICD-10-CM

## 2018-03-04 DIAGNOSIS — M25552 Pain in left hip: Secondary | ICD-10-CM | POA: Diagnosis not present

## 2018-03-04 DIAGNOSIS — Z7401 Bed confinement status: Secondary | ICD-10-CM

## 2018-03-04 DIAGNOSIS — I959 Hypotension, unspecified: Secondary | ICD-10-CM | POA: Diagnosis present

## 2018-03-04 DIAGNOSIS — E440 Moderate protein-calorie malnutrition: Secondary | ICD-10-CM

## 2018-03-04 DIAGNOSIS — T17998A Other foreign object in respiratory tract, part unspecified causing other injury, initial encounter: Secondary | ICD-10-CM

## 2018-03-04 DIAGNOSIS — R710 Precipitous drop in hematocrit: Secondary | ICD-10-CM | POA: Diagnosis not present

## 2018-03-04 DIAGNOSIS — Z0189 Encounter for other specified special examinations: Secondary | ICD-10-CM

## 2018-03-04 DIAGNOSIS — Z91041 Radiographic dye allergy status: Secondary | ICD-10-CM

## 2018-03-04 DIAGNOSIS — Z6841 Body Mass Index (BMI) 40.0 and over, adult: Secondary | ICD-10-CM | POA: Diagnosis not present

## 2018-03-04 DIAGNOSIS — R601 Generalized edema: Secondary | ICD-10-CM

## 2018-03-04 DIAGNOSIS — I361 Nonrheumatic tricuspid (valve) insufficiency: Secondary | ICD-10-CM | POA: Diagnosis not present

## 2018-03-04 DIAGNOSIS — K219 Gastro-esophageal reflux disease without esophagitis: Secondary | ICD-10-CM | POA: Diagnosis not present

## 2018-03-04 DIAGNOSIS — Z79899 Other long term (current) drug therapy: Secondary | ICD-10-CM

## 2018-03-04 DIAGNOSIS — D62 Acute posthemorrhagic anemia: Principal | ICD-10-CM | POA: Diagnosis present

## 2018-03-04 DIAGNOSIS — T502X5A Adverse effect of carbonic-anhydrase inhibitors, benzothiadiazides and other diuretics, initial encounter: Secondary | ICD-10-CM | POA: Diagnosis not present

## 2018-03-04 DIAGNOSIS — E119 Type 2 diabetes mellitus without complications: Secondary | ICD-10-CM

## 2018-03-04 DIAGNOSIS — I340 Nonrheumatic mitral (valve) insufficiency: Secondary | ICD-10-CM

## 2018-03-04 DIAGNOSIS — L8990 Pressure ulcer of unspecified site, unspecified stage: Secondary | ICD-10-CM

## 2018-03-04 DIAGNOSIS — Z950 Presence of cardiac pacemaker: Secondary | ICD-10-CM

## 2018-03-04 DIAGNOSIS — I482 Chronic atrial fibrillation, unspecified: Secondary | ICD-10-CM | POA: Diagnosis present

## 2018-03-04 DIAGNOSIS — K7010 Alcoholic hepatitis without ascites: Secondary | ICD-10-CM | POA: Diagnosis not present

## 2018-03-04 DIAGNOSIS — D631 Anemia in chronic kidney disease: Secondary | ICD-10-CM | POA: Diagnosis not present

## 2018-03-04 DIAGNOSIS — N320 Bladder-neck obstruction: Secondary | ICD-10-CM | POA: Diagnosis not present

## 2018-03-04 LAB — CBC
HCT: 20.8 % — ABNORMAL LOW (ref 36.0–46.0)
Hemoglobin: 6.3 g/dL — CL (ref 12.0–15.0)
MCH: 28.9 pg (ref 26.0–34.0)
MCHC: 30.3 g/dL (ref 30.0–36.0)
MCV: 95.4 fL (ref 80.0–100.0)
Platelets: 231 10*3/uL (ref 150–400)
RBC: 2.18 MIL/uL — ABNORMAL LOW (ref 3.87–5.11)
RDW: 17.3 % — ABNORMAL HIGH (ref 11.5–15.5)
WBC: 20.1 10*3/uL — ABNORMAL HIGH (ref 4.0–10.5)
nRBC: 0 % (ref 0.0–0.2)

## 2018-03-04 LAB — PREPARE RBC (CROSSMATCH)

## 2018-03-04 LAB — HEMOGLOBIN AND HEMATOCRIT, BLOOD
HEMATOCRIT: 25.8 % — AB (ref 36.0–46.0)
Hemoglobin: 8.1 g/dL — ABNORMAL LOW (ref 12.0–15.0)

## 2018-03-04 LAB — GLUCOSE, CAPILLARY
GLUCOSE-CAPILLARY: 95 mg/dL (ref 70–99)
Glucose-Capillary: 105 mg/dL — ABNORMAL HIGH (ref 70–99)
Glucose-Capillary: 97 mg/dL (ref 70–99)

## 2018-03-04 LAB — PROTIME-INR
INR: 2.48
Prothrombin Time: 26.5 seconds — ABNORMAL HIGH (ref 11.4–15.2)

## 2018-03-04 LAB — LACTIC ACID, PLASMA
Lactic Acid, Venous: 0.5 mmol/L (ref 0.5–1.9)
Lactic Acid, Venous: 0.8 mmol/L (ref 0.5–1.9)

## 2018-03-04 LAB — ECHOCARDIOGRAM COMPLETE
Height: 67 in
Weight: 3880.1 oz

## 2018-03-04 LAB — MRSA PCR SCREENING: MRSA by PCR: NEGATIVE

## 2018-03-04 LAB — BRAIN NATRIURETIC PEPTIDE: B NATRIURETIC PEPTIDE 5: 298 pg/mL — AB (ref 0.0–100.0)

## 2018-03-04 LAB — PROCALCITONIN: Procalcitonin: 0.44 ng/mL

## 2018-03-04 LAB — ABO/RH: ABO/RH(D): O POS

## 2018-03-04 MED ORDER — SODIUM CHLORIDE 0.9% FLUSH
3.0000 mL | Freq: Two times a day (BID) | INTRAVENOUS | Status: DC
  Administered 2018-03-04 – 2018-03-15 (×24): 3 mL via INTRAVENOUS
  Administered 2018-03-16: 21:00:00 via INTRAVENOUS
  Administered 2018-03-16 – 2018-03-26 (×17): 3 mL via INTRAVENOUS

## 2018-03-04 MED ORDER — FUROSEMIDE 10 MG/ML IJ SOLN: 40.0000 mg | Freq: Once | INTRAMUSCULAR | Status: AC | PRN

## 2018-03-04 MED ORDER — INSULIN ASPART 100 UNIT/ML ~~LOC~~ SOLN: 0.0000 [IU] | Freq: Every day | SUBCUTANEOUS | Status: DC

## 2018-03-04 MED ORDER — METOPROLOL TARTRATE 5 MG/5ML IV SOLN
5.0000 mg | INTRAVENOUS | Status: AC | PRN
  Administered 2018-03-04 – 2018-03-05 (×2): 5 mg via INTRAVENOUS
  Filled 2018-03-04 (×2): qty 5

## 2018-03-04 MED ORDER — POLYETHYLENE GLYCOL 3350 17 G PO PACK
17.0000 g | PACK | Freq: Every day | ORAL | Status: DC
  Administered 2018-03-04 – 2018-03-18 (×12): 17 g via ORAL
  Filled 2018-03-04 (×14): qty 1

## 2018-03-04 MED ORDER — CHLORHEXIDINE GLUCONATE 0.12 % MT SOLN
15.0000 mL | Freq: Two times a day (BID) | OROMUCOSAL | Status: DC
  Administered 2018-03-05 – 2018-03-10 (×8): 15 mL via OROMUCOSAL
  Filled 2018-03-04 (×15): qty 15

## 2018-03-04 MED ORDER — PANTOPRAZOLE SODIUM 40 MG IV SOLR: 40.0000 mg | Freq: Two times a day (BID) | INTRAVENOUS | Status: DC

## 2018-03-04 MED ORDER — FUROSEMIDE 10 MG/ML IJ SOLN
40.0000 mg | Freq: Two times a day (BID) | INTRAMUSCULAR | Status: DC
  Administered 2018-03-04 (×2): 40 mg via INTRAVENOUS
  Filled 2018-03-04 (×2): qty 4

## 2018-03-04 MED ORDER — TRAZODONE HCL 50 MG PO TABS
25.0000 mg | ORAL_TABLET | Freq: Every evening | ORAL | Status: DC | PRN
  Administered 2018-03-05 – 2018-03-26 (×7): 25 mg via ORAL
  Filled 2018-03-04 (×7): qty 1

## 2018-03-04 MED ORDER — ORAL CARE MOUTH RINSE
15.0000 mL | Freq: Two times a day (BID) | OROMUCOSAL | Status: DC
  Administered 2018-03-05 – 2018-03-10 (×7): 15 mL via OROMUCOSAL

## 2018-03-04 MED ORDER — ACETAMINOPHEN 650 MG RE SUPP: 650.0000 mg | Freq: Four times a day (QID) | RECTAL | Status: DC | PRN

## 2018-03-04 MED ORDER — ONDANSETRON HCL 4 MG/2ML IJ SOLN
4.0000 mg | Freq: Four times a day (QID) | INTRAMUSCULAR | Status: DC | PRN
  Administered 2018-03-10 – 2018-03-20 (×3): 4 mg via INTRAVENOUS
  Filled 2018-03-04 (×3): qty 2

## 2018-03-04 MED ORDER — FUROSEMIDE 10 MG/ML IJ SOLN: 40.0000 mg | Freq: Once | INTRAMUSCULAR | Status: DC

## 2018-03-04 MED ORDER — ALBUTEROL SULFATE (2.5 MG/3ML) 0.083% IN NEBU
3.0000 mL | INHALATION_SOLUTION | Freq: Four times a day (QID) | RESPIRATORY_TRACT | Status: DC | PRN
  Administered 2018-03-13 (×2): 3 mL via RESPIRATORY_TRACT
  Filled 2018-03-04 (×2): qty 3

## 2018-03-04 MED ORDER — ONDANSETRON HCL 4 MG PO TABS
4.0000 mg | ORAL_TABLET | Freq: Four times a day (QID) | ORAL | Status: DC | PRN
  Administered 2018-03-13: 4 mg via ORAL
  Filled 2018-03-04: qty 1

## 2018-03-04 MED ORDER — SODIUM CHLORIDE 0.9% IV SOLUTION
Freq: Once | INTRAVENOUS | Status: AC
  Administered 2018-03-04: 13:00:00 via INTRAVENOUS

## 2018-03-04 MED ORDER — INSULIN ASPART 100 UNIT/ML ~~LOC~~ SOLN: 0.0000 [IU] | Freq: Three times a day (TID) | SUBCUTANEOUS | Status: DC

## 2018-03-04 MED ORDER — SODIUM CHLORIDE 0.9% IV SOLUTION: Freq: Once | INTRAVENOUS | Status: DC

## 2018-03-04 MED ORDER — ACETAMINOPHEN 325 MG PO TABS
650.0000 mg | ORAL_TABLET | Freq: Four times a day (QID) | ORAL | Status: DC | PRN
  Administered 2018-03-09 – 2018-03-15 (×7): 650 mg via ORAL
  Filled 2018-03-04 (×11): qty 2

## 2018-03-04 MED ORDER — BISACODYL 5 MG PO TBEC
20.0000 mg | DELAYED_RELEASE_TABLET | Freq: Once | ORAL | Status: AC
  Administered 2018-03-04: 20 mg via ORAL
  Filled 2018-03-04: qty 4

## 2018-03-04 MED ORDER — PANTOPRAZOLE SODIUM 40 MG PO TBEC
40.0000 mg | DELAYED_RELEASE_TABLET | Freq: Every day | ORAL | Status: DC
  Administered 2018-03-04 – 2018-03-27 (×24): 40 mg via ORAL
  Filled 2018-03-04 (×24): qty 1

## 2018-03-04 NOTE — H&P (Signed)
History and Physical    Latasha Guzman TZG:[id number] DOB: 01-23-1946 DOA: 03/04/2018  PCP: Nicoletta Dress, MD Patient coming from: [facility]  I have personally briefly reviewed patient's old medical records in [place]  Chief Complaint: gi bleed  HPI: Latasha Guzman is a 73 y.o. female with medical history significant of atrial fibrillation on chronic anticoagulation with Eliquis as well as diabetes mellitus, hypertension, GERD who presents as a direct admission from [facility] emergency room where patient had presented from an assisted living facility secondary to increased shortness of breath.  Patient is a very poor historian but report from the ER indicate patient presented from the rehab center secondary to increased shortness of breath.  In the ER patient was found have a hemoglobin of 6.7 down from 9.2 with melanotic stools.  Her chest x-ray was concern for cardiomegaly with mild vascular congestion and because of her GI bleed with no GI service available she was consulted to the hospitalist service at [facility] long for transfer and GI evaluation.  ED Course: At outside ER patient received 1 unit of packed red blood cells, with minimal increase to 6.9, she does not receive Lasix.  And was noted to be hemodynamically stable per their report with a blood pressure 119/78 pulse 104 respirations of 18 and O2 saturations of 94 to 97%  Review of Systems: As per HPI otherwise 10 point review of systems negative.    Past Medical History:  Diagnosis Date  . Acute blood loss anemia 08/02/2017  . Atrial fibrillation ([place])   . Chronic atrial fibrillation 10/15/2015  . Chronic diastolic CHF (congestive heart failure) ([place])   . Diabetes mellitus ([place]) 07/27/2017  . Edema   . Essential hypertension 10/15/2015  . GERD (gastroesophageal reflux disease) 07/27/2017  . GI bleed 07/24/2017  . Hyperlipidemia   . Pacemaker 10/15/2015  . Pneumonia of right lower lobe due to infectious organism ([place])  08/02/2017  . Skin rash 08/02/2017  . Type 2 diabetes mellitus without complication ([place]) 7/59/1638    Past Surgical History:  Procedure Laterality Date  . ABDOMINAL HYSTERECTOMY  1995   Total  . AV FISTULA REPAIR    . CATARACT EXTRACTION    . GIVENS CAPSULE STUDY N/A 12/30/2017   Procedure: GIVENS CAPSULE STUDY;  Surgeon: Ronnette Juniper, MD;  Location: [facility];  Service: Gastroenterology;  Laterality: N/A;  . PACEMAKER INSERTION  10/30/2015   By Dr. Agustin Cree     reports that she has never smoked. She has never used smokeless tobacco. She reports that she does not drink alcohol or use drugs.  Allergies  Allergen Reactions  . Iodine Hives, Other (See Comments) and Shortness Of Breath    Throat swells  . Iodinated Diagnostic Agents   . Penicillins Hives    Has patient had a PCN reaction causing immediate rash, facial/tongue/throat swelling, SOB or lightheadedness with hypotension: Yes Has patient had a PCN reaction causing severe rash involving mucus membranes or skin necrosis: No Has patient had a PCN reaction that required hospitalization: No Has patient had a PCN reaction occurring within the last 10 years: No If all of the above answers are "NO", then may proceed with Cephalosporin use.     Family History  Problem Relation Age of Onset  . Stroke Mother   . Heart disease Mother   . Diabetes Mother   . Hypertension Mother   . Hyperlipidemia Mother   . Heart attack Father   . Heart disease Father   .  Diabetes Maternal Grandfather   . Hypertension Maternal Grandfather   . Heart attack Paternal Grandfather   . Heart disease Paternal Grandfather      Prior to Admission medications   Medication Sig Start Date End Date Taking? Authorizing Provider  apixaban (ELIQUIS) 5 MG TABS tablet Take 10 mg by mouth every evening.     [provider]  atorvastatin (LIPITOR) 80 MG tablet Take 80 mg by mouth at bedtime.  03/23/17   [provider]  diltiazem  (CARDIZEM) 90 MG tablet Take 1 tablet (90 mg total) by mouth every 6 (six) hours. 01/01/18 01/31/18  Donne Hazel, MD  doxycycline (VIBRA-TABS) 100 MG tablet Take 1 tablet (100 mg total) by mouth 2 (two) times daily. 01/18/18   Landis Martins, DPM  fenofibrate 160 MG tablet Take 160 mg by mouth every morning.  03/23/17   [provider]  furosemide (LASIX) 80 MG tablet Take 80 mg by mouth every morning.  12/23/17   [provider]  omeprazole (PRILOSEC) 20 MG capsule Take 20 mg by mouth every morning.  03/23/17   [provider]  PROAIR HFA 108 (90 Base) MCG/ACT inhaler Inhale 2 puffs into the lungs every 6 (six) hours as needed for wheezing or shortness of breath.  04/24/17   [provider]    Physical Exam: Vitals:   03/04/18 0525 03/04/18 0600 03/04/18 0637 03/04/18 0800  BP:  (!) 90/48 (!) 108/56 (!) 114/55  Pulse:  (!) 106 (!) 123 (!) 106  Resp:  19 20 16   Temp:  98.6 F (37 C) 98.4 F (36.9 C) 98.4 F (36.9 C)  TempSrc:      SpO2:  97% 95% 96%  Weight: 110 kg     Height: 5\' 7"  (1.702 m)       Constitutional: NAD, calm, comfortable Vitals:   03/04/18 0525 03/04/18 0600 03/04/18 0637 03/04/18 0800  BP:  (!) 90/48 (!) 108/56 (!) 114/55  Pulse:  (!) 106 (!) 123 (!) 106  Resp:  19 20 16   Temp:  98.6 F (37 C) 98.4 F (36.9 C) 98.4 F (36.9 C)  TempSrc:      SpO2:  97% 95% 96%  Weight: 110 kg     Height: 5\' 7"  (1.702 m)      Eyes: PERRL, lids and conjunctivae normal ENMT: Mucous membranes are moist. Posterior pharynx clear of any exudate or lesions.Normal dentition.  Neck: normal, supple, no masses, no thyromegaly Respiratory: Rales bilaterally, trace rhonchi bilaterally, no accessory muscle use Cardiovascular: Regular rate and rhythm, no murmurs / rubs / gallops. No extremity edema. 2+ pedal pulses. No carotid bruits.  Abdomen: no tenderness, no masses palpated. No hepatosplenomegaly. Bowel sounds positive.  Musculoskeletal: no  clubbing / cyanosis. No joint deformity upper and lower extremities. Good ROM, no contractures. Normal muscle tone.  Skin: no rashes, lesions, ulcers. No induration Neurologic: CN 2-12 grossly intact. Sensation intact, DTR normal. Strength 5/5 in all 4.  Psychiatric: Poor insight no acute decompensation probable early cognitive deficits    Labs on Admission: I have personally reviewed following labs and imaging studies  CBC: No results for input(s): WBC, NEUTROABS, HGB, HCT, MCV, PLT in the last 168 hours. Basic Metabolic Panel: No results for input(s): NA, K, CL, CO2, GLUCOSE, BUN, CREATININE, CALCIUM, MG, PHOS in the last 168 hours. GFR: CrCl cannot be calculated (Patient's most recent lab result is older than the maximum 21 days allowed.). Liver Function Tests: No results for input(s): AST,  ALT, ALKPHOS, BILITOT, PROT, ALBUMIN in the last 168 hours. No results for input(s): LIPASE, AMYLASE in the last 168 hours. No results for input(s): AMMONIA in the last 168 hours. Coagulation Profile: No results for input(s): INR, PROTIME in the last 168 hours. Cardiac Enzymes: No results for input(s): CKTOTAL, CKMB, CKMBINDEX, TROPONINI in the last 168 hours. BNP (last 3 results) No results for input(s): PROBNP in the last 8760 hours. HbA1C: No results for input(s): HGBA1C in the last 72 hours. CBG: No results for input(s): GLUCAP in the last 168 hours. Lipid Profile: No results for input(s): CHOL, HDL, LDLCALC, TRIG, CHOLHDL, LDLDIRECT in the last 72 hours. Thyroid Function Tests: No results for input(s): TSH, T4TOTAL, FREET4, T3FREE, THYROIDAB in the last 72 hours. Anemia Panel: No results for input(s): VITAMINB12, FOLATE, FERRITIN, TIBC, IRON, RETICCTPCT in the last 72 hours. Urine analysis: No results found for: COLORURINE, APPEARANCEUR, LABSPEC, [place], GLUCOSEU, HGBUR, BILIRUBINUR, KETONESUR, PROTEINUR, UROBILINOGEN, NITRITE, LEUKOCYTESUR  Radiological Exams on Admission: Dg Chest  Port 1 View  Result Date: 03/04/2018 CLINICAL DATA:  Shortness of breath. EXAM: PORTABLE CHEST 1 VIEW COMPARISON:  One-view chest x-ray 12/27/2017 FINDINGS: Heart is enlarged. Interstitial edema and bilateral effusions are present. Bibasilar airspace disease likely reflects atelectasis aortic atherosclerosis is noted. No other airspace consolidation is present. Pacing wire is stable. IMPRESSION: 1. Congestive heart failure. 2. Bibasilar airspace disease likely reflects atelectasis. Electronically Signed   By: San Morelle M.D.   On: 03/04/2018 07:01    EKG: Independently reviewed. none  Assessment/Plan Principal Problem:   GI bleed Active Problems:   Acute blood loss anemia   Chronic atrial fibrillation   Diabetes mellitus (HCC)   Essential hypertension   GERD (gastroesophageal reflux disease)   Acute diastolic CHF (congestive heart failure) (HCC)   Acute GI bleeding   Pressure injury of skin  (please populate well all problems here in Problem List. (For example, if patient is on BP meds at home and you resume or decide to hold them, it is a problem that needs to be her. Same for CAD, COPD, HLD and so on)   Acute GI bleed.  I am awaiting repeat labs this morning.  Transfuse if hemoglobin less than 7, discussed case with GI who will see in consultation.  Continue with IV PPI twice daily.  Hemoglobin hematocrit every 8 hours next lab 12 noon.  Patient was admitted to stepdown unit secondary to mild hypotension in the setting of GI bleed.  Acute blood loss anemia.  Again monitor hemoglobin transfuse as indicated as above.  Patient mildly hypotensive but fluid resuscitation limited secondary to acute CHF exacerbation  Acute diastolic CHF.  Patient carries a diagnosis of diastolic dysfunction with her most recent echocardiogram had limited evaluation.  Going to repeat echocardiogram to eval systolic function and diastolic function.  Will avoid nitrates secondary mild hypotension,  diuresis with parameters, strict I's and O's and daily weights  IVs mellitus.  Place patient on sliding scale insulin sensitive.  She is n.p.o. we will monitor blood glucose before meals at bedtime.  Hypertension.  Patient mildly hypotensive we will hold blood pressure medications for now diuresis with parameters as noted above.  A. fib.  Patient is on telemetry monitor closely holding nodal blocking agents secondary to GI bleed with mild hypotension currently patient is in a normal sinus rhythm.  Holding Eliquis.  Discussed with patient's niece who reported she has had multiple episodes of bleeding-consider discontinuing on discharge if family in agreement  Confusion.  In discussion with patient's niece this morning she indicated patient been confused for several weeks since being at the rehab.  She reported it started when she was on tramadol.  Avoid tramadol, I will check a UA,  DVT prophylaxis: scd Code Status: full Family Communication: spoke with [place] brady agreed with plan of care, did express concern oabout two week hx of confusion since at SNF Disposition Plan: TO SNF  Consults called: GI, DR Carlean Purl (with names) Admission status: INPT SDU   Nicolette Bang MD [facility] If 7PM-7AM, please contact night-coverage [email or web address] Password [email or web address]  03/04/2018, 9:01 AM

## 2018-03-04 NOTE — Consult Note (Addendum)
[facility] Consult: 8:19 AM 03/04/2018  LOS: 0 days    Referring Provider: Dr Wyonia Hough  Primary Care Physician:  Nicoletta Dress, MD  Dr Celedonio Miyamoto Primary Gastroenterologist:  Althia Forts, Dr. Lyda Jester in [place]. Niece Varney [phone]    Reason for Consultation:  Bloody stools.    HPI: Latasha Guzman is a 73 y.o. female.  SNF resident from [facility].  Hx diastolic CHF.  Morbid obesity.  Afib.  S/p pacemaker  Htn.  DM 2.  Recurrent PNA.  CKD 3.  Diverticulosis.  IDA, anemia chronic dz.  Has required multiple transfusions and EPO in past.  Chronic LE lymphedema.   Echocardiogram 12/2017 LVEF 50 -55%, moderate to severe tricuspid regurgitation.  Pericardial effusion with fibrinous material.  Severe biatrial enlargement and elevated right-sided pressures.  S/p hysterectomy.                          08/10/2017 EGD and colonoscopy at [facility] center.  Per notes from November, 2019 these were unremarkable. 10/2017 colonoscopy and EGD repeated by Dr. Lyda Jester.  Mild diverticular disease, external hemorrhoids, hiatal hernia noted otherwise normal studies. 01/02/2018 Capsule endoscopy.  Normal study.  Read by Dr.Karki in [place], report in [place].    Home meds include Miralax, Omeprazole, Eliquis Apparently has had recent admission for UTI. Sent to the [facility] ED with SOB, anasarca, weeping LE edema.  Sats 80%,   Tachypnea, hypoxic, tachy.  Presumed UTI with sepsis.  Chronic difficulty voiding  Hgb 6.7, MCV 91.  Apparently this Hgb is near baseline. Was 7.7 to 8.2 in 12/2017.  Today Hgb up to 8.2.     PT/INR 17.7/1.8 BUN 50 WBCs 23.5 Troponins I normal x 2.   LFTs normal except t bili 1.5, albumin 2.5.    CT chest, abd, pelvis without contrast:  Ascites, SQ edema, pleural  effusions, pericardial effusion (stable).  RLL PNA/airspace dz.  Likely cirrhosis and splenomegaly.  Compression fx.  Large stool in colon/rectum.  CM, CAD, Aortic atherosclerosis.    IV Protonix, Vanc, imipenem initiated.   At some point he developed blood per rectum.Marland Kitchen  She was transferred overnight to [place] stool was observed by nursing overnight.  No emesis.  No abdominal pain.  Patient admits to poor appetite, constipation.  She cannot tell me when was the last time she had a bowel movement.  She does not recall previous bloody stool.   Past Medical History:  Diagnosis Date  . Acute blood loss anemia 08/02/2017  . Atrial fibrillation ([place])   . Chronic atrial fibrillation 10/15/2015  . Chronic diastolic CHF (congestive heart failure) ([place])   . Diabetes mellitus ([place]) 07/27/2017  . Edema   . Essential hypertension 10/15/2015  . GERD (gastroesophageal reflux disease) 07/27/2017  . GI bleed 07/24/2017  . Hyperlipidemia   . Pacemaker 10/15/2015  . Pneumonia of right lower lobe due to infectious organism ([place]) 08/02/2017  . Skin rash 08/02/2017  . Type 2 diabetes mellitus without complication ([place]) 0/94/7096  Past Surgical History:  Procedure Laterality Date  . ABDOMINAL HYSTERECTOMY  1995   Total  . AV FISTULA REPAIR    . CATARACT EXTRACTION    . GIVENS CAPSULE STUDY N/A 12/30/2017   Procedure: GIVENS CAPSULE STUDY;  Surgeon: Ronnette Juniper, MD;  Location: [facility];  Service: Gastroenterology;  Laterality: N/A;  . PACEMAKER INSERTION  10/30/2015   By Dr. Agustin Cree    Prior to Admission medications   Medication Sig Start Date End Date Taking? Authorizing Provider  apixaban (ELIQUIS) 5 MG TABS tablet Take 10 mg by mouth every evening.     [provider]  atorvastatin (LIPITOR) 80 MG tablet Take 80 mg by mouth at bedtime.  03/23/17   [provider]  diltiazem (CARDIZEM) 90 MG tablet Take 1 tablet (90 mg total) by mouth every 6 (six) hours. 01/01/18  01/31/18  Donne Hazel, MD  doxycycline (VIBRA-TABS) 100 MG tablet Take 1 tablet (100 mg total) by mouth 2 (two) times daily. 01/18/18   Landis Martins, DPM  fenofibrate 160 MG tablet Take 160 mg by mouth every morning.  03/23/17   [provider]  furosemide (LASIX) 80 MG tablet Take 80 mg by mouth every morning.  12/23/17   [provider]  omeprazole (PRILOSEC) 20 MG capsule Take 20 mg by mouth every morning.  03/23/17   [provider]  PROAIR HFA 108 (90 Base) MCG/ACT inhaler Inhale 2 puffs into the lungs every 6 (six) hours as needed for wheezing or shortness of breath.  04/24/17   [provider]    Scheduled Meds: . sodium chloride   Intravenous Once  . furosemide  40 mg Intravenous BID  . pantoprazole (PROTONIX) IV  40 mg Intravenous Q12H   Infusions:  PRN Meds:    Allergies as of 03/04/2018 - Review Complete 03/04/2018  Allergen Reaction Noted  . Iodine Hives, Other (See Comments), and Shortness Of Breath 10/15/2015  . Iodinated diagnostic agents  10/15/2015  . Penicillins Hives 10/15/2015    Family History  Problem Relation Age of Onset  . Stroke Mother   . Heart disease Mother   . Diabetes Mother   . Hypertension Mother   . Hyperlipidemia Mother   . Heart attack Father   . Heart disease Father   . Diabetes Maternal Grandfather   . Hypertension Maternal Grandfather   . Heart attack Paternal Grandfather   . Heart disease Paternal Grandfather     Social History   Socioeconomic History  . Marital status: Widowed    Spouse name: Not on file  . Number of children: Not on file  . Years of education: Not on file  . Highest education level: Not on file  Occupational History  . Occupation: Retired  Scientific laboratory technician  . Financial resource strain: Not on file  . Food insecurity:    Worry: Not on file    Inability: Not on file  . Transportation needs:    Medical: Not on file    Non-medical: Not on file  Tobacco Use  . Smoking  status: Never Smoker  . Smokeless tobacco: Never Used  Substance and Sexual Activity  . Alcohol use: Never    Frequency: Never  . Drug use: Never  . Sexual activity: Not on file  Lifestyle  . Physical activity:    Days per week: Not on file    Minutes per session: Not on file  . Stress: Not on file  Relationships  . Social connections:  Talks on phone: Not on file    Gets together: Not on file    Attends religious service: Not on file    Active member of club or organization: Not on file    Attends meetings of clubs or organizations: Not on file    Relationship status: Not on file  . Intimate partner violence:    Fear of current or ex partner: Not on file    Emotionally abused: Not on file    Physically abused: Not on file    Forced sexual activity: Not on file  Other Topics Concern  . Not on file  Social History Narrative  . Not on file    REVIEW OF SYSTEMS: Constitutional: Patient is bedridden. ENT:  No nose bleeds Pulm: Trouble breathing.  Week, mostly nonproductive cough. CV: Chronic edema with weeping of the arms and legs. GU:  No hematuria.  Urinary incontinence at times. GI: Denies dysphagia.  Denies nausea. Heme: No excessive bleeding or bruising reported but does have a lot of purpura. Transfusions: See HPI. Neuro:  No headaches, no peripheral tingling or numbness Derm:  No itching, no rash or sores.  Endocrine:  No sweats or chills.  No polyuria or dysuria Immunization: Information regarding recent or prior immunizations not available. Travel:  None beyond local counties in last few months.    PHYSICAL EXAM: Vital signs in last 24 hours: Vitals:   03/04/18 0600 03/04/18 0637  BP: (!) 90/48 (!) 108/56  Pulse: (!) 106 (!) 123  Resp: 19 20  Temp: 98.6 F (37 C) 98.4 F (36.9 C)  SpO2: 97% 95%   Wt Readings from Last 3 Encounters:  03/04/18 110 kg  01/01/18 111.8 kg  12/27/17 120.2 kg    General: Obese, chronically and acutely ill, pale, WF.   Looks older than stated age. Head: No facial asymmetry or swelling.  No signs of head trauma. Eyes: Scleral icterus.  Conjunctiva pale. Ears: Not hard of hearing. Nose: Discharge. Mouth: Dry oral mucosa.  No blood in the mouth.  Tongue midline. Neck: No mass, no JVD. Lungs: Rough, rhonchorous breath sounds bilaterally.  Dyspnea with speech. Heart: Irregularly irregular, slightly tachycardic. Abdomen: Soft, large/obese.  Not protuberant or distended.  No HSM, masses, bruits, hernias appreciated.  Old, well-healed low midline scar..   Rectal: Abundant soft stool in the rectum, tests FOBT positive.  No visible blood.  Visible hemorrhoids without obvious bleeding. Musc/Skeltl: No joint redness. Extremities: Extensive weeping, this during edema of the arms and legs.  Erythema of the left greater than right leg.  Purpura on the arms and upper trunk. Neurologic: Appropriate.  Not able to tell me where she is currently but tells me that her home is in [place].  Follows commands.  Moves all 4 limbs, strength not tested.  No asterixis. Skin: Extensive anasarca/edema of the limbs.  Multiple foam dressings covering the arms. Tattoos: None Nodes: No cervical adenopathy. Psych: Cooperative.  Paucity of speech, shortness of breath and dry mouth contributing to this.  Intake/Output from previous day: 01/10 0701 - 01/11 0700 In: -  Out: 25 [Urine:25]  LAB RESULTS: No results for input(s): WBC, HGB, HCT, PLT in the last 72 hours. BMET Lab Results  Component Value Date   NA 139 01/01/2018   NA 137 12/31/2017   NA 139 12/30/2017   K 4.0 01/01/2018   K 4.0 12/31/2017   K 3.6 12/30/2017   CL 105 01/01/2018   CL 105 12/31/2017   CL 105 12/30/2017  CO2 28 01/01/2018   CO2 24 12/31/2017   CO2 28 12/30/2017   GLUCOSE 98 01/01/2018   GLUCOSE 85 12/31/2017   GLUCOSE 106 (H) 12/30/2017   BUN 16 01/01/2018   BUN 17 12/31/2017   BUN 12 12/30/2017   CREATININE 1.31 (H) 01/01/2018   CREATININE 1.39  (H) 12/31/2017   CREATININE 1.40 (H) 12/30/2017   CALCIUM 9.2 01/01/2018   CALCIUM 8.8 (L) 12/31/2017   CALCIUM 9.2 12/30/2017     RADIOLOGY STUDIES: Dg Chest Port 1 View  Result Date: 03/04/2018 CLINICAL DATA:  Shortness of breath. EXAM: PORTABLE CHEST 1 VIEW COMPARISON:  One-view chest x-ray 12/27/2017 FINDINGS: Heart is enlarged. Interstitial edema and bilateral effusions are present. Bibasilar airspace disease likely reflects atelectasis aortic atherosclerosis is noted. No other airspace consolidation is present. Pacing wire is stable. IMPRESSION: 1. Congestive heart failure. 2. Bibasilar airspace disease likely reflects atelectasis. Electronically Signed   By: San Morelle M.D.   On: 03/04/2018 07:01    IMPRESSION:   *  Blood in the stool.  By DRE though stool is medium brown, FOBT positive, soft rectal impaction, visible hemorrhoids but no gross blood.   She has diverticulosis but it does not sound like this is a diverticular level bleed.  Bleeding could be coming from hemorrhoids or from stercoral ulcer given her constipation  *    Acute on chronic anemia.  This is been worked up with EGD: On 2 occasions last year, most recently in September 2019.  Unremarkable PillCam study in 12/2017. Not clear if she is still receiving EPO.  No indication she is receiving oral or parenteral iron.   Has not been transfused.    *   A fib, RVR.  Chronic Eliquis on hold  *    Cirrhosis of the liver per CT scan, this looks to be a newly discovered diagnosis.  Suspect the cause is Latasha Guzman given her significant obesity.  No portal htn gastropathy, varices on EGD x 2 in 2019.    *   CHF, diastolic  *   Anasarca, ascites.  Chronic weeping edema of limbs.  ? cellulitis of LE.      *    Sepsis criteria, ? PNA, ? Cellulitis, ? UTI?     PLAN:     *   Stop IV Protonix, switch her to oral Protonix.  Allow clear liquids.    *    Dulcolax 20 mg once, MiraLAX 17g daily.  *    Given recent  extensive GI procedures, do not expect that she will need to undergo repeat EGD or colonoscopy.  *   Follow H/H, transfuse if less than 7.     Azucena Freed  03/04/2018, 8:19 AM Phone [place] Attending   I have taken an interval history, reviewed the chart and examined the patient. I agree with the Advanced Practitioner's note, impression and recommendations.   Exact cause of blood in stool not clear but hemorrhoids seem likely.  At this point no good indication for endoscopic evaluation or treatment. Hgb has dropped further and she will be getting blood.  Multiple problems here - we will reassess tomorrow. May have cirrhosis pattern due to RHF  Gatha Mayer, MD, [facility] 03/04/2018 1:26 PM Pager [phone]

## 2018-03-04 NOTE — Progress Notes (Signed)
Update on patient: recd 1u PRBC transfusion at [facility], reported increase in HGB from 6.7 to 6.9 only.  Report that RN got was that CXR showed possible RLL PNA?  Patient with crackles on ascultation per RN, but not in respiratory distress at this time.  Fluid overload vs PNA? (didn't get lasix at [facility]).  RN feels that patient is stable at this time.  Will order repeat CBC/BMP, CXR, pro calcitonin, lactic acid, BNP, INR, type and screen and prepare 1u PRBC.  Patient to be seen by day admissions team after 7am.

## 2018-03-04 NOTE — Progress Notes (Signed)
  Echocardiogram 2D Echocardiogram has been performed.  Latasha Guzman 03/04/2018, 1:23 PM

## 2018-03-05 DIAGNOSIS — K921 Melena: Secondary | ICD-10-CM

## 2018-03-05 LAB — BASIC METABOLIC PANEL
Anion gap: 10 (ref 5–15)
BUN: 50 mg/dL — ABNORMAL HIGH (ref 8–23)
CALCIUM: 8.3 mg/dL — AB (ref 8.9–10.3)
CO2: 23 mmol/L (ref 22–32)
Chloride: 106 mmol/L (ref 98–111)
Creatinine, Ser: 1.45 mg/dL — ABNORMAL HIGH (ref 0.44–1.00)
GFR calc Af Amer: 42 mL/min — ABNORMAL LOW (ref >60)
GFR calc non Af Amer: 36 mL/min — ABNORMAL LOW (ref >60)
Glucose, Bld: 97 mg/dL (ref 70–99)
Potassium: 3.3 mmol/L — ABNORMAL LOW (ref 3.5–5.1)
SODIUM: 139 mmol/L (ref 135–145)

## 2018-03-05 LAB — GLUCOSE, CAPILLARY
Glucose-Capillary: 88 mg/dL (ref 70–99)
Glucose-Capillary: 85 mg/dL (ref 70–99)
Glucose-Capillary: 82 mg/dL (ref 70–99)
Glucose-Capillary: 93 mg/dL (ref 70–99)

## 2018-03-05 LAB — CBC
HCT: 25.3 % — ABNORMAL LOW (ref 36.0–46.0)
Hemoglobin: 8.3 g/dL — ABNORMAL LOW (ref 12.0–15.0)
MCH: 29.1 pg (ref 26.0–34.0)
MCHC: 32.8 g/dL (ref 30.0–36.0)
MCV: 88.8 fL (ref 80.0–100.0)
Platelets: 233 10*3/uL (ref 150–400)
RBC: 2.85 MIL/uL — ABNORMAL LOW (ref 3.87–5.11)
RDW: 16.7 % — ABNORMAL HIGH (ref 11.5–15.5)
WBC: 18.2 10*3/uL — ABNORMAL HIGH (ref 4.0–10.5)
nRBC: 0 % (ref 0.0–0.2)

## 2018-03-05 MED ORDER — FUROSEMIDE 10 MG/ML IJ SOLN
80.0000 mg | Freq: Two times a day (BID) | INTRAMUSCULAR | Status: DC
  Administered 2018-03-05 – 2018-03-07 (×4): 80 mg via INTRAVENOUS
  Filled 2018-03-05 (×4): qty 8

## 2018-03-05 MED ORDER — HYDROCORTISONE 2.5 % RE CREA
TOPICAL_CREAM | Freq: Two times a day (BID) | RECTAL | Status: DC
  Administered 2018-03-05 – 2018-03-10 (×10): via RECTAL
  Administered 2018-03-10: 1 via RECTAL
  Administered 2018-03-11 – 2018-03-15 (×6): via RECTAL
  Administered 2018-03-18: 1 via RECTAL
  Administered 2018-03-19 – 2018-03-24 (×7): via RECTAL
  Filled 2018-03-05 (×2): qty 28.35

## 2018-03-05 MED ORDER — POTASSIUM CHLORIDE CRYS ER 20 MEQ PO TBCR
40.0000 meq | EXTENDED_RELEASE_TABLET | Freq: Once | ORAL | Status: AC
  Administered 2018-03-05: 40 meq via ORAL
  Filled 2018-03-05: qty 2

## 2018-03-05 MED ORDER — GUAIFENESIN-DM 100-10 MG/5ML PO SYRP
5.0000 mL | ORAL_SOLUTION | ORAL | Status: DC | PRN
  Administered 2018-03-05 – 2018-03-06 (×3): 5 mL via ORAL
  Filled 2018-03-05 (×3): qty 10

## 2018-03-05 MED ORDER — DILTIAZEM HCL 90 MG PO TABS
90.0000 mg | ORAL_TABLET | Freq: Four times a day (QID) | ORAL | Status: DC
  Administered 2018-03-05 – 2018-03-23 (×65): 90 mg via ORAL
  Filled 2018-03-05 (×68): qty 1

## 2018-03-05 NOTE — Progress Notes (Signed)
PROGRESS NOTE    Latasha Guzman  BPZ:[id number] DOB: 1945-03-29 DOA: 03/04/2018 PCP: Nicoletta Dress, MD    Brief Narrative:  73 year old female who presented dyspnea.  She does have a past medical history of atrial fibrillation, type 2 diabetes mellitus, hypertension.  She presented to [facility] from assisted living facility, and she was found to be anemic with a hemoglobin of 6.7, along with melanotic stools.  She was transferred to [facility] for further GI workup.  On her initial physical examination blood pressure 119/78, heart rate 104, respiratory rate 18, oxygen saturation 94%.  Moist mucous membranes, her lungs had rales bilaterally, heart S1-S2 no gallops, rubs or murmurs, abdomen was soft and non tender, no lower extremity edema.  Sodium 139, potassium 3.6, bicarb 23, BUN 15, creatinine 1,45, white cell count 20.1 hemoglobin 6.3, hematocrit 20, platelets 231. Chest radiograph with a right rotation, bilateral interstitial infiltrates with bilateral pleural effusion and vascular congestion.  EKG atrial fibrillation heart rate 123, normal axis, positive PVC.   Patient was admitted with the working diagnosis of symptomatic anemia due to lower gi bleed complicated with acute pulmonary edema.    Assessment & Plan:   Principal Problem:   GI bleed Active Problems:   Acute blood loss anemia   Chronic atrial fibrillation   Diabetes mellitus (HCC)   Essential hypertension   GERD (gastroesophageal reflux disease)   Acute diastolic CHF (congestive heart failure) (HCC)   Acute GI bleeding   Pressure injury of skin   Shortness of breath   1. Acute symptomatic anemia, due to lower GI bleed. Patient is sp 2 unit prbc since admission, her hb this am is 8,3 with hct at 25,3. Will continue cell count monitoring, no further macroscopic bleed. On clear liquid diet. Continue proton pump inhibitors with pantoprazole. May need endoscopy on this admission, target Hb above 8.    2. Acute pulmonary  edema due to acute decompensation of chronic heart failure. Echocardiography with preserved LV systolic function, 55 to 24%, with moderate pericardial effusion. Today clinically still hypervolemic, will continue aggressive diuresis with IV furosemide 80 mg IV bid. Urine output over last 24 h, only 950 ml. Patient at home on 40 mg of daily furosemide.   3. Atrial fibrillation with RVR. Patient at home on diltiazem 90 mg q 6 h, that will be resumed. Continue telemetry monitoring. Continue diuresis. Hold on anticoagulation due to acute anemia.   4. CKD stage 3. Base cr 1.2. today at 1,45, K at 3,3, and serum bicarbonate at 23. Will continue diuresis with furosemide IV, follow renal panel and urine output.   5. T2DM. Fasting glucose this am 97, will continue insulin sliding scale for glucose cover and monitoring. On clear liquid diet.    DVT prophylaxis: unable to placed scd due to edema.   Code Status:  full Family Communication: no family at the bedside.  Disposition Plan/ discharge barriers: Pending clinical improvement, patient continue to be high risk for decompensation.   Body mass index is 37.33 kg/m. Malnutrition Type:      Malnutrition Characteristics:      Nutrition Interventions:     RN Pressure Injury Documentation: Pressure Injury 03/04/18 Deep Tissue Injury - Purple or maroon localized area of discolored intact skin or blood-filled blister due to damage of underlying soft tissue from pressure and/or shear. (Active)  03/04/18 0500  Location: Sacrum  Location Orientation: Mid  Staging: Deep Tissue Injury - Purple or maroon localized area of discolored intact skin  or blood-filled blister due to damage of underlying soft tissue from pressure and/or shear.  Wound Description (Comments):   Present on Admission: Yes     Consultants:   GI   Procedures:     Antimicrobials:       Subjective: Patient is somnolent, able to answer simple questions and follow  commands, continue to have dyspnea, moderate in intensity, reported worsening lower extremity edema, uses a walker for ambulation, limited history.   Objective: Vitals:   03/05/18 0300 03/05/18 0400 03/05/18 0500 03/05/18 0600  BP: 116/63 120/69 109/60 104/60  Pulse: (!) 101 (!) 109 (!) 117 (!) 105  Resp: (!) 22 (!) 22 20 (!) 21  Temp: 99.3 F (37.4 C) 99.3 F (37.4 C) 99.3 F (37.4 C) 99.1 F (37.3 C)  TempSrc:      SpO2: 97% 98% 99% 97%  Weight:   108.1 kg   Height:        Intake/Output Summary (Last 24 hours) at 03/05/2018 0833 Last data filed at 03/05/2018 0600 Gross per 24 hour  Intake 447.83 ml  Output 950 ml  Net -502.17 ml   Filed Weights   03/04/18 0525 03/05/18 0500  Weight: 110 kg 108.1 kg    Examination:   General: deconditioned and ill looking appearing.  Neurology: somnolent, responds to voice, non focal  E ENT: positive pallor, no icterus, oral mucosa is dry.  Cardiovascular: Mild JVD. S1-S2 present, irregularly irregular, no gallops, rubs, or murmurs. +++ pitting lower extremity edema. Pulmonary: decreased breath sounds bilaterally, decreased air movement, no wheezing, rhonchi or rales. Gastrointestinal. Abdomen protuberant, but soft, no organomegaly, non tender, no rebound or guarding Skin. No rashes Musculoskeletal: no joint deformities     Data Reviewed: I have personally reviewed following labs and imaging studies  CBC: Recent Labs  Lab 03/04/18 0844 03/04/18 2144 03/05/18 0259  WBC 20.1*  --  18.2*  HGB 6.3* 8.1* 8.3*  HCT 20.8* 25.8* 25.3*  MCV 95.4  --  88.8  PLT 231  --  782   Basic Metabolic Panel: Recent Labs  Lab 03/05/18 0259  NA 139  K 3.3*  CL 106  CO2 23  GLUCOSE 97  BUN 50*  CREATININE 1.45*  CALCIUM 8.3*   GFR: Estimated Creatinine Clearance: 45.1 mL/min (A) (by C-G formula based on SCr of 1.45 mg/dL (H)). Liver Function Tests: No results for input(s): AST, ALT, ALKPHOS, BILITOT, PROT, ALBUMIN in the last 168  hours. No results for input(s): LIPASE, AMYLASE in the last 168 hours. No results for input(s): AMMONIA in the last 168 hours. Coagulation Profile: Recent Labs  Lab 03/04/18 0844  INR 2.48   Cardiac Enzymes: No results for input(s): CKTOTAL, CKMB, CKMBINDEX, TROPONINI in the last 168 hours. BNP (last 3 results) No results for input(s): PROBNP in the last 8760 hours. HbA1C: No results for input(s): HGBA1C in the last 72 hours. CBG: Recent Labs  Lab 03/04/18 1235 03/04/18 1554 03/04/18 2159 03/05/18 0741  GLUCAP 95 105* 97 88   Lipid Profile: No results for input(s): CHOL, HDL, LDLCALC, TRIG, CHOLHDL, LDLDIRECT in the last 72 hours. Thyroid Function Tests: No results for input(s): TSH, T4TOTAL, FREET4, T3FREE, THYROIDAB in the last 72 hours. Anemia Panel: No results for input(s): VITAMINB12, FOLATE, FERRITIN, TIBC, IRON, RETICCTPCT in the last 72 hours.    Radiology Studies: I have reviewed all of the imaging during this hospital visit personally     Scheduled Meds: . sodium chloride   Intravenous Once  .  chlorhexidine  15 mL Mouth Rinse BID  . furosemide  40 mg Intravenous BID  . insulin aspart  0-5 Units Subcutaneous QHS  . insulin aspart  0-9 Units Subcutaneous TID WC  . mouth rinse  15 mL Mouth Rinse q12n4p  . pantoprazole  40 mg Oral Daily  . polyethylene glycol  17 g Oral Daily  . sodium chloride flush  3 mL Intravenous Q12H   Continuous Infusions:   LOS: 1 day        Gabrien Mentink Gerome Apley, MD [facility] Pager [phone]

## 2018-03-05 NOTE — Progress Notes (Signed)
   Patient Name: Latasha Guzman Date of Encounter: 03/05/2018, 1:29 PM    Subjective  No bleeding noted by staff   Objective  BP 104/60   Pulse (!) 105   Temp 99.1 F (37.3 C)   Resp (!) 21   Ht 5\' 7"  (1.702 m)   Wt 108.1 kg   SpO2 97%   BMI 37.33 kg/m  Chronically ill, NAD  CBC Latest Ref Rng & Units 03/05/2018 03/04/2018 03/04/2018  WBC 4.0 - 10.5 K/uL 18.2(H) - 20.1(H)  Hemoglobin 12.0 - 15.0 g/dL 8.3(L) 8.1(L) 6.3(LL)  Hematocrit 36.0 - 46.0 % 25.3(L) 25.8(L) 20.8(L)  Platelets 150 - 400 K/uL 233 - 231       Assessment and Plan  Lower GI bleed on Eliquis - hemorrhoidal, could be diverticular Multiple Extensive GI w/u in past year or so  Rec Tx hemorrhoids w/ cream No endoscopic evaluation planned No eliquis x 1 week  Call us back prn GI f/u can be local with Dr. Lyda Jester - if needed but does not need to see GI at dc based upon recent events in context of prior evaluations  Gatha Mayer, MD, [facility] 03/05/2018 1:29 PM Pager [phone]

## 2018-03-06 LAB — BASIC METABOLIC PANEL
ANION GAP: 10 (ref 5–15)
BUN: 49 mg/dL — ABNORMAL HIGH (ref 8–23)
CO2: 24 mmol/L (ref 22–32)
CREATININE: 1.33 mg/dL — AB (ref 0.44–1.00)
Calcium: 8.1 mg/dL — ABNORMAL LOW (ref 8.9–10.3)
Chloride: 105 mmol/L (ref 98–111)
GFR calc non Af Amer: 40 mL/min — ABNORMAL LOW (ref >60)
GFR, EST AFRICAN AMERICAN: 46 mL/min — AB (ref >60)
Glucose, Bld: 86 mg/dL (ref 70–99)
Potassium: 3.1 mmol/L — ABNORMAL LOW (ref 3.5–5.1)
Sodium: 139 mmol/L (ref 135–145)

## 2018-03-06 LAB — CBC WITH DIFFERENTIAL/PLATELET
Abs Immature Granulocytes: 0.16 10*3/uL — ABNORMAL HIGH (ref 0.00–0.07)
BASOS ABS: 0 10*3/uL (ref 0.0–0.1)
Basophils Relative: 0 %
Eosinophils Absolute: 0.2 10*3/uL (ref 0.0–0.5)
Eosinophils Relative: 1 %
HCT: 26.6 % — ABNORMAL LOW (ref 36.0–46.0)
Hemoglobin: 8.5 g/dL — ABNORMAL LOW (ref 12.0–15.0)
Immature Granulocytes: 1 %
Lymphocytes Relative: 5 %
Lymphs Abs: 0.8 10*3/uL (ref 0.7–4.0)
MCH: 28.6 pg (ref 26.0–34.0)
MCHC: 32 g/dL (ref 30.0–36.0)
MCV: 89.6 fL (ref 80.0–100.0)
Monocytes Absolute: 0.9 10*3/uL (ref 0.1–1.0)
Monocytes Relative: 6 %
Neutro Abs: 13.6 10*3/uL — ABNORMAL HIGH (ref 1.7–7.7)
Neutrophils Relative %: 87 %
Platelets: 211 10*3/uL (ref 150–400)
RBC: 2.97 MIL/uL — ABNORMAL LOW (ref 3.87–5.11)
RDW: 17.2 % — AB (ref 11.5–15.5)
WBC: 15.7 10*3/uL — ABNORMAL HIGH (ref 4.0–10.5)
nRBC: 0 % (ref 0.0–0.2)

## 2018-03-06 LAB — BPAM RBC
Blood Product Expiration Date: 202002102359
Blood Product Expiration Date: 202002112359
ISSUE DATE / TIME: 202001111253
ISSUE DATE / TIME: 202001111636
Unit Type and Rh: 5100
Unit Type and Rh: 5100

## 2018-03-06 LAB — GLUCOSE, CAPILLARY
Glucose-Capillary: 85 mg/dL (ref 70–99)
Glucose-Capillary: 91 mg/dL (ref 70–99)
Glucose-Capillary: 94 mg/dL (ref 70–99)
Glucose-Capillary: 84 mg/dL (ref 70–99)

## 2018-03-06 LAB — TYPE AND SCREEN
ABO/RH(D): O POS
Antibody Screen: NEGATIVE
Unit division: 0
Unit division: 0

## 2018-03-06 MED ORDER — METOPROLOL TARTRATE 25 MG PO TABS
25.0000 mg | ORAL_TABLET | Freq: Two times a day (BID) | ORAL | Status: DC
  Administered 2018-03-06 – 2018-03-22 (×33): 25 mg via ORAL
  Filled 2018-03-06 (×35): qty 1

## 2018-03-06 MED ORDER — FUROSEMIDE 40 MG PO TABS
80.0000 mg | ORAL_TABLET | Freq: Once | ORAL | Status: AC
  Administered 2018-03-06: 80 mg via ORAL
  Filled 2018-03-06: qty 2

## 2018-03-06 MED ORDER — POTASSIUM CHLORIDE CRYS ER 20 MEQ PO TBCR
40.0000 meq | EXTENDED_RELEASE_TABLET | Freq: Once | ORAL | Status: AC
  Administered 2018-03-06: 40 meq via ORAL
  Filled 2018-03-06: qty 2

## 2018-03-06 NOTE — Progress Notes (Signed)
VAST RN consulted for PIV placement. Upon arriving to bedside, spoke with unit RN Hildred Alamin who noted pt's skin is weeping. Hildred Alamin currently changing bandages on BUE. Pt's only IV med at this time is Lasix BID. VAST RN suggested Hildred Alamin, RN check and see if lasix can be changed to PO dose to reduce risk for infection and allow for vein preservation.

## 2018-03-06 NOTE — Progress Notes (Signed)
Patient is edematous. No IV access on arrival to unit. Provider contacted, pt is on Telemetry. Provider request IV team to attempt access. Provider ok If unable,  pt will have tele no access

## 2018-03-06 NOTE — Progress Notes (Addendum)
PROGRESS NOTE    Latasha Guzman  VFI:[id number] DOB: [birth year] DOA: 03/04/2018 PCP: Nicoletta Dress, MD    Brief Narrative:  73 year old female who presented dyspnea.  She does have a past medical history of atrial fibrillation, type 2 diabetes mellitus, hypertension.  She presented to [facility] from assisted living facility, and she was found to be anemic with a hemoglobin of 6.7, along with melanotic stools.  She was transferred to [facility] for further GI workup.  On her initial physical examination blood pressure 119/78, heart rate 104, respiratory rate 18, oxygen saturation 94%.  Moist mucous membranes, her lungs had rales bilaterally, heart S1-S2 no gallops, rubs or murmurs, abdomen was soft and non tender, no lower extremity edema.  Sodium 139, potassium 3.6, bicarb 23, BUN 15, creatinine 1,45, white cell count 20.1 hemoglobin 6.3, hematocrit 20, platelets 231. Chest radiograph with a right rotation, bilateral interstitial infiltrates with bilateral pleural effusion and vascular congestion.  EKG atrial fibrillation heart rate 123, normal axis, positive PVC.   Patient was admitted with the working diagnosis of symptomatic anemia due to lower gi bleed complicated with acute pulmonary edema.    Assessment & Plan:   Principal Problem:   GI bleed Active Problems:   Acute blood loss anemia   Chronic atrial fibrillation   Diabetes mellitus (HCC)   Essential hypertension   GERD (gastroesophageal reflux disease)   Acute diastolic CHF (congestive heart failure) (HCC)   Acute GI bleeding   Pressure injury of skin   Shortness of breath   Hematochezia  1. Acute symptomatic anemia, due to lower GI bleed. Hb and Hct have been stable, at 8,5 and 26,6. Case discussed with Dr. Carlean Purl yesterday, no current indication for endoscopic procedure. Will continue to hold on anticoagulation for now and follow on cell count in am, if continue stable hb and hct, will consider resuming anticoagulation.  Continue pantoprazole for now.   2. Acute pulmonary edema due to acute decompensation of chronic heart failure. Echocardiography with preserved LV systolic function, 55 to 41%, with moderate pericardial effusion. Symptoms have improved, but still not back to baseline, significant lower extremity edema, will continue aggressive diuresis with IV furosemide 80 mg IV bid. Patient requested urinary catheter to be removed due to pain, not accurate urine output documentation.  3. Atrial fibrillation with RVR. Uncontrolled ventricular rate, will add metoprolol 25 mg po bid and will continue diltiazem 90 mg q 6 h. Will plan to resume anticoagulation in am if no further bleeding and stable hb. Continue telemetry monitoring.   4. CKD stage 3 with hypokelemia. Base cr 1.2. serum cr trending down, today at 1.33 with K at 3,1, will continue diuresis, correct K with po Kcl and will follow on renal panel in am. Avoid nephrotoxic medications or hypotension.  5. T2DM. Fasting glucose this am 86, will advance diet to diabetic prudent, heart healthy, continue glucose monitoring with insulin sliding scale.    DVT prophylaxis: not able to use scd due to edema  Code Status:  full Family Communication: no family at the bedside  Disposition Plan/ discharge barriers: transfer to telemetry.   Body mass index is 37.08 kg/m. Malnutrition Type:      Malnutrition Characteristics:      Nutrition Interventions:     RN Pressure Injury Documentation: Pressure Injury 03/04/18 Deep Tissue Injury - Purple or maroon localized area of discolored intact skin or blood-filled blister due to damage of underlying soft tissue from pressure and/or shear. (Active)  03/04/18  0500  Location: Sacrum  Location Orientation: Mid  Staging: Deep Tissue Injury - Purple or maroon localized area of discolored intact skin or blood-filled blister due to damage of underlying soft tissue from pressure and/or shear.  Wound Description  (Comments):   Present on Admission: Yes     Consultants:   GI  Procedures:     Antimicrobials:       Subjective: Patient is feeling better but not yet back to baseline, continue to have significant edema and difficulty moving, positive dyspnea. No chest pain, no nausea or vomiting, at home uses a walker. Foley catheter has been removed.   Objective: Vitals:   03/06/18 0400 03/06/18 0410 03/06/18 0500 03/06/18 0600  BP: 113/63   124/72  Pulse: (!) 112   (!) 103  Resp: (!) 21   19  Temp:  97.8 F (36.6 C)    TempSrc:  Oral    SpO2: 99%   100%  Weight:   107.4 kg   Height:        Intake/Output Summary (Last 24 hours) at 03/06/2018 3716 Last data filed at 03/06/2018 0533 Gross per 24 hour  Intake 600 ml  Output -  Net 600 ml   Filed Weights   03/04/18 0525 03/05/18 0500 03/06/18 0500  Weight: 110 kg 108.1 kg 107.4 kg    Examination:   General: deconditioned and ill looking appearing  Neurology: Awake and alert, non focal  E ENT: moderate pallor, no icterus, oral mucosa moist Cardiovascular: No JVD. S1-S2 present, irregularly irregular, no gallops, rubs, or murmurs. ++/+++ pitting lower extremity edema. Pulmonary: decreased breath sounds bilaterally at bases, no wheezing, rhonchi or rales. Gastrointestinal. Abdomen protuberant with no organomegaly, non tender, no rebound or guarding Skin. No rashes Musculoskeletal: no joint deformities     Data Reviewed: I have personally reviewed following labs and imaging studies  CBC: Recent Labs  Lab 03/04/18 0844 03/04/18 2144 03/05/18 0259 03/06/18 0337  WBC 20.1*  --  18.2* 15.7*  NEUTROABS  --   --   --  13.6*  HGB 6.3* 8.1* 8.3* 8.5*  HCT 20.8* 25.8* 25.3* 26.6*  MCV 95.4  --  88.8 89.6  PLT 231  --  233 967   Basic Metabolic Panel: Recent Labs  Lab 03/05/18 0259 03/06/18 0337  NA 139 139  K 3.3* 3.1*  CL 106 105  CO2 23 24  GLUCOSE 97 86  BUN 50* 49*  CREATININE 1.45* 1.33*  CALCIUM 8.3* 8.1*    GFR: Estimated Creatinine Clearance: 48.9 mL/min (A) (by C-G formula based on SCr of 1.33 mg/dL (H)). Liver Function Tests: No results for input(s): AST, ALT, ALKPHOS, BILITOT, PROT, ALBUMIN in the last 168 hours. No results for input(s): LIPASE, AMYLASE in the last 168 hours. No results for input(s): AMMONIA in the last 168 hours. Coagulation Profile: Recent Labs  Lab 03/04/18 0844  INR 2.48   Cardiac Enzymes: No results for input(s): CKTOTAL, CKMB, CKMBINDEX, TROPONINI in the last 168 hours. BNP (last 3 results) No results for input(s): PROBNP in the last 8760 hours. HbA1C: No results for input(s): HGBA1C in the last 72 hours. CBG: Recent Labs  Lab 03/05/18 0741 03/05/18 1148 03/05/18 1532 03/05/18 2117 03/06/18 0805  GLUCAP 88 85 82 93 85   Lipid Profile: No results for input(s): CHOL, HDL, LDLCALC, TRIG, CHOLHDL, LDLDIRECT in the last 72 hours. Thyroid Function Tests: No results for input(s): TSH, T4TOTAL, FREET4, T3FREE, THYROIDAB in the last 72 hours. Anemia Panel: No  results for input(s): VITAMINB12, FOLATE, FERRITIN, TIBC, IRON, RETICCTPCT in the last 72 hours.    Radiology Studies: I have reviewed all of the imaging during this hospital visit personally     Scheduled Meds: . sodium chloride   Intravenous Once  . chlorhexidine  15 mL Mouth Rinse BID  . diltiazem  90 mg Oral Q6H  . furosemide  80 mg Intravenous BID  . hydrocortisone   Rectal BID  . insulin aspart  0-5 Units Subcutaneous QHS  . insulin aspart  0-9 Units Subcutaneous TID WC  . mouth rinse  15 mL Mouth Rinse q12n4p  . pantoprazole  40 mg Oral Daily  . polyethylene glycol  17 g Oral Daily  . sodium chloride flush  3 mL Intravenous Q12H   Continuous Infusions:   LOS: 2 days        Latasha Krist Gerome Apley, MD [facility] Pager [phone]

## 2018-03-06 NOTE — Evaluation (Signed)
Clinical/Bedside Swallow Evaluation Patient Details  Name: Latasha Guzman MRN: [id number] Date of Birth: 10/13/1945  Today's Date: 03/06/2018 Time: SLP Start Time (ACUTE ONLY): 1100 SLP Stop Time (ACUTE ONLY): 1135 SLP Time Calculation (min) (ACUTE ONLY): 35 min  Past Medical History:  Past Medical History:  Diagnosis Date  . Acute blood loss anemia 08/02/2017  . Atrial fibrillation ([place])   . Chronic atrial fibrillation 10/15/2015  . Chronic diastolic CHF (congestive heart failure) ([place])   . Diabetes mellitus ([place]) 07/27/2017  . Edema   . Essential hypertension 10/15/2015  . GERD (gastroesophageal reflux disease) 07/27/2017  . GI bleed 07/24/2017  . Hyperlipidemia   . Pacemaker 10/15/2015  . Pneumonia of right lower lobe due to infectious organism ([place]) 08/02/2017  . Skin rash 08/02/2017  . Type 2 diabetes mellitus without complication ([place]) 0/73/7106   Past Surgical History:  Past Surgical History:  Procedure Laterality Date  . ABDOMINAL HYSTERECTOMY  1995   Total  . AV FISTULA REPAIR    . CATARACT EXTRACTION    . GIVENS CAPSULE STUDY N/A 12/30/2017   Procedure: GIVENS CAPSULE STUDY;  Surgeon: Ronnette Juniper, MD;  Location: [place];  Service: Gastroenterology;  Laterality: N/A;  . PACEMAKER INSERTION  10/30/2015   By Dr. Agustin Cree   HPI:  73 year old female admitted 03/04/2018 from ALF with GI Bleed, SOB. PMH: AFib, DM, HTN, GERD, RLL PNA (June 2019). CXR = cardiomegaly with mild vascular congestion   Assessment / Plan / Recommendation Clinical Impression  Pt seen at bedside for swallow evaluation. Oral care was completed with suction. Dentures were removed, cleaned, and replaced. Pt accepted trials of thin liquid, puree, and solid consistencies. She reports no history of difficulty swallowing, and demonstrated no obvious oral deficits or residue, and no overt s/s aspiration on any consistency. Will continue current diet and recommend routine safe swallow precautions, including 45  degrees for 30 minutes after meals. No further ST intervention recommended at this time. Please reconsult if needs arise.   SLP Visit Diagnosis: Dysphagia, unspecified (R13.10)    Aspiration Risk  Mild aspiration risk    Diet Recommendation Regular;Thin liquid   Liquid Administration via: Cup;Straw Medication Administration: Whole meds with liquid Supervision: Staff to assist with self feeding Compensations: Minimize environmental distractions;Slow rate;Small sips/bites Postural Changes: Seated upright at 90 degrees;Remain upright for at least 30 minutes after po intake    Other  Recommendations Oral Care Recommendations: Oral care QID Other Recommendations: Have oral suction available   Follow up Recommendations None          Prognosis Prognosis for Safe Diet Advancement: Good      Swallow Study   General Date of Onset: 03/04/18 HPI: 73 year old female admitted 03/04/2018 from ALF with GI Bleed, SOB. PMH: AFib, DM, HTN, GERD, RLL PNA (June 2019). CXR = cardiomegaly with mild vascular congestion Type of Study: Bedside Swallow Evaluation Previous Swallow Assessment: none found Diet Prior to this Study: Regular;Thin liquids Temperature Spikes Noted: No Respiratory Status: Nasal cannula History of Recent Intubation: No Behavior/Cognition: Alert;Cooperative;Pleasant mood Oral Cavity Assessment: Within Functional Limits Oral Care Completed by SLP: Yes Oral Cavity - Dentition: Dentures, bottom;Dentures, top Vision: Functional for self-feeding Self-Feeding Abilities: Needs assist;Needs set up Patient Positioning: Upright in bed Baseline Vocal Quality: Normal Volitional Cough: Strong Volitional Swallow: Able to elicit    Oral/Motor/Sensory Function Overall Oral Motor/Sensory Function: Within functional limits   Ice Chips Ice chips: Not tested   Thin Liquid Thin Liquid: Within functional  limits Presentation: Straw    Nectar Thick Nectar Thick Liquid: Not tested   Honey  Thick Honey Thick Liquid: Not tested   Puree Puree: Within functional limits Presentation: Spoon   Solid     Solid: Within functional limits Presentation: [place] B. Quentin Ore, [facility], [place] Speech Language Pathologist [phone]  Shonna Chock 03/06/2018,11:38 AM

## 2018-03-06 NOTE — Plan of Care (Signed)
  Problem: Education: °Goal: Knowledge of General Education information will improve °Description: Including pain rating scale, medication(s)/side effects and non-pharmacologic comfort measures °Outcome: Progressing °  °Problem: Clinical Measurements: °Goal: Ability to maintain clinical measurements within normal limits will improve °Outcome: Progressing °  °Problem: Elimination: °Goal: Will not experience complications related to bowel motility °Outcome: Progressing °Goal: Will not experience complications related to urinary retention °Outcome: Progressing °  °Problem: Pain Managment: °Goal: General experience of comfort will improve °Outcome: Progressing °  °Problem: Safety: °Goal: Ability to remain free from injury will improve °Outcome: Progressing °  °Problem: Skin Integrity: °Goal: Risk for impaired skin integrity will decrease °Outcome: Progressing °  °

## 2018-03-06 NOTE — Progress Notes (Signed)
This RN put in IV team consult after both IVs were infiltrated or occluded for this patient. IV team made aware to this RN that it would be difficult to get access due to the patient's edema and weeping. I paged Dr. Cathlean Sauer who changed scheduled medications to PO and was ok with no IV access at this time. This was passed on to the [address]West floor nurse for patient hand off.

## 2018-03-07 LAB — CBC WITH DIFFERENTIAL/PLATELET
ABS IMMATURE GRANULOCYTES: 0.2 10*3/uL — AB (ref 0.00–0.07)
BASOS PCT: 0 %
Basophils Absolute: 0 10*3/uL (ref 0.0–0.1)
EOS ABS: 0.3 10*3/uL (ref 0.0–0.5)
Eosinophils Relative: 2 %
HCT: 27.9 % — ABNORMAL LOW (ref 36.0–46.0)
Hemoglobin: 8.5 g/dL — ABNORMAL LOW (ref 12.0–15.0)
Immature Granulocytes: 1 %
Lymphocytes Relative: 5 %
Lymphs Abs: 0.7 10*3/uL (ref 0.7–4.0)
MCH: 28.2 pg (ref 26.0–34.0)
MCHC: 30.5 g/dL (ref 30.0–36.0)
MCV: 92.7 fL (ref 80.0–100.0)
Monocytes Absolute: 0.8 10*3/uL (ref 0.1–1.0)
Monocytes Relative: 6 %
Neutro Abs: 12.2 10*3/uL — ABNORMAL HIGH (ref 1.7–7.7)
Neutrophils Relative %: 86 %
Platelets: 193 10*3/uL (ref 150–400)
RBC: 3.01 MIL/uL — ABNORMAL LOW (ref 3.87–5.11)
RDW: 17.4 % — ABNORMAL HIGH (ref 11.5–15.5)
WBC: 14.3 10*3/uL — ABNORMAL HIGH (ref 4.0–10.5)
nRBC: 0 % (ref 0.0–0.2)

## 2018-03-07 LAB — BASIC METABOLIC PANEL
ANION GAP: 11 (ref 5–15)
BUN: 47 mg/dL — ABNORMAL HIGH (ref 8–23)
CO2: 22 mmol/L (ref 22–32)
Calcium: 8 mg/dL — ABNORMAL LOW (ref 8.9–10.3)
Chloride: 104 mmol/L (ref 98–111)
Creatinine, Ser: 1.21 mg/dL — ABNORMAL HIGH (ref 0.44–1.00)
GFR calc non Af Amer: 45 mL/min — ABNORMAL LOW (ref >60)
GFR, EST AFRICAN AMERICAN: 52 mL/min — AB (ref >60)
Glucose, Bld: 87 mg/dL (ref 70–99)
Potassium: 3 mmol/L — ABNORMAL LOW (ref 3.5–5.1)
Sodium: 137 mmol/L (ref 135–145)

## 2018-03-07 LAB — GLUCOSE, CAPILLARY
GLUCOSE-CAPILLARY: 118 mg/dL — AB (ref 70–99)
Glucose-Capillary: 83 mg/dL (ref 70–99)
Glucose-Capillary: 98 mg/dL (ref 70–99)
Glucose-Capillary: 102 mg/dL — ABNORMAL HIGH (ref 70–99)

## 2018-03-07 MED ORDER — COLLAGENASE 250 UNIT/GM EX OINT
TOPICAL_OINTMENT | Freq: Every day | CUTANEOUS | Status: DC
  Administered 2018-03-07 – 2018-03-27 (×17): via TOPICAL
  Filled 2018-03-07: qty 90
  Filled 2018-03-07: qty 30
  Filled 2018-03-07 (×4): qty 90

## 2018-03-07 MED ORDER — APIXABAN 5 MG PO TABS
5.0000 mg | ORAL_TABLET | Freq: Two times a day (BID) | ORAL | Status: DC
  Administered 2018-03-07 – 2018-03-22 (×29): 5 mg via ORAL
  Filled 2018-03-07 (×30): qty 1

## 2018-03-07 MED ORDER — GUAIFENESIN-CODEINE 100-10 MG/5ML PO SOLN: 10.0000 mL | ORAL | Status: DC | PRN

## 2018-03-07 MED ORDER — POTASSIUM CHLORIDE CRYS ER 20 MEQ PO TBCR
40.0000 meq | EXTENDED_RELEASE_TABLET | ORAL | Status: AC
  Administered 2018-03-07 (×2): 40 meq via ORAL
  Filled 2018-03-07 (×2): qty 2

## 2018-03-07 MED ORDER — FUROSEMIDE 10 MG/ML IJ SOLN
80.0000 mg | Freq: Three times a day (TID) | INTRAMUSCULAR | Status: DC
  Administered 2018-03-07 – 2018-03-17 (×32): 80 mg via INTRAVENOUS
  Filled 2018-03-07 (×32): qty 8

## 2018-03-07 NOTE — Progress Notes (Signed)
PROGRESS NOTE    Latasha Guzman  EHM:[id number] DOB: 03-28-1945 DOA: 03/04/2018 PCP: Nicoletta Dress, MD    Brief Narrative:  73 year old female who presented dyspnea. She does have a past medical history of atrial fibrillation, type 2 diabetes mellitus, hypertension. [facility],and she was found to be anemic with a hemoglobin of 6.7,along with melanotic stools.Shewas transferred to [facility] for further GI workup.On her initial physical examination blood pressure 119/78, heart rate 104,respiratory rate 18,oxygen saturation 94%.Moist mucous membranes, her lungs had rales bilaterally, heart S1-S2 no gallops, rubs or murmurs, abdomen wassoftand non tender,no lower extremityedema.Sodium 139, potassium3.6, bicarb 23, BUN 15, creatinine1,45,white cell count 20.1 hemoglobin 6.3, hematocrit 20, platelets 231.Chest radiograph with a rightrotation,bilateral interstitial infiltrates with bilateral pleural effusion and vascular congestion.EKG atrial fibrillation heart rate 123, normal axis, positive PVC.   Patient was admitted with the working diagnosis of symptomatic anemia due to lower gi bleed complicated with acute pulmonary edema.   Assessment & Plan:   Principal Problem:   GI bleed Active Problems:   Acute blood loss anemia   Chronic atrial fibrillation   Diabetes mellitus (HCC)   Essential hypertension   GERD (gastroesophageal reflux disease)   Acute diastolic CHF (congestive heart failure) (HCC)   Acute GI bleeding   Pressure injury of skin   Shortness of breath   Hematochezia   1. Acute symptomatic anemia, due to lower GI bleed. No further bleeding and no indication for endoscopic procedure per GI recommendations. Will resume anticoagulation with apixaban and continue to monitor H&H.   2. Acute pulmonary edemadue to acute decompensation of chronic heart failure. Echocardiography with preserved LV systolic  function, 55 to 60%, with moderate pericardial effusion. Patient with persistent hypervolemia, will increase furosemide to 80 mg IV tid, and continue to monitor response. Patient requested Foley to be removed due to pain. Continue heart failure management with metoprolol. Will add cough suppressive agents and chest PT.   3. Atrial fibrillation with RVR. Improved rate control with metoprolol 25 mg po bid and diltiazem 90 mg q 6 h. Resume anticoagulation with apixaban. Continue telemetry monitoring.    4. CKD stage 3 with hypokelemia. Base cr 1.2. Stable renal function with serum cr today at baseline, with K at 3,0 and serum bicarbonate at 22. Will continue with aggressive diuresis, correct K with Kcl and follow on renal panel in am.  5. T2DM. Glucose continue to be well controlled, with fasting glucose at 87. Continue iss for glucose cover and monitoring.   DVT prophylaxis: not able to use scd due to edema  Code Status:  full Family Communication: no family at the bedside  Disposition Plan/ discharge barriers: pending clinical improvement  Body mass index is 39.64 kg/m. Malnutrition Type:      Malnutrition Characteristics:      Nutrition Interventions:     RN Pressure Injury Documentation: Pressure Injury 03/04/18 Deep Tissue Injury - Purple or maroon localized area of discolored intact skin or blood-filled blister due to damage of underlying soft tissue from pressure and/or shear. (Active)  03/04/18 0500  Location: Sacrum  Location Orientation: Mid  Staging: Deep Tissue Injury - Purple or maroon localized area of discolored intact skin or blood-filled blister due to damage of underlying soft tissue from pressure and/or shear.  Wound Description (Comments):   Present on Admission: Yes     Pressure Injury 03/06/18 Stage III -  Full thickness tissue loss. Subcutaneous fat may be visible but bone, tendon  or muscle are NOT exposed. (Active)  03/06/18 0721  Location: Sacrum    Location Orientation: Mid  Staging: Stage III -  Full thickness tissue loss. Subcutaneous fat may be visible but bone, tendon or muscle are NOT exposed.  Wound Description (Comments):   Present on Admission: Yes     Consultants:   GI  Procedures:     Antimicrobials:       Subjective: Patient continue to have significant edema of the lower extremities, along with persistent cough and dyspnea. No chest pain or further bleeding, no nausea or vomiting.   Objective: Vitals:   03/07/18 0500 03/07/18 0537 03/07/18 0944 03/07/18 1247  BP:  113/63 113/65 112/63  Pulse:  91 79 77  Resp:  20 16 16   Temp:  97.7 F (36.5 C)  98.2 F (36.8 C)  TempSrc:  Oral  Oral  SpO2:  99%  100%  Weight: 114.8 kg     Height:        Intake/Output Summary (Last 24 hours) at 03/07/2018 1359 Last data filed at 03/07/2018 0541 Gross per 24 hour  Intake -  Output 450 ml  Net -450 ml   Filed Weights   03/05/18 0500 03/06/18 0500 03/07/18 0500  Weight: 108.1 kg 107.4 kg 114.8 kg    Examination:   General: deconditioned and ill looking appearing  Neurology: Awake and alert, non focal  E ENT: positive pallor, no icterus, oral mucosa moist Cardiovascular: No JVD. S1-S2 present, rhythmic, no gallops, rubs, or murmurs. +++ pitting lower extremity edema. Pulmonary: decreased breath sounds bilaterally, poor air movement, positive expiratory wheezing on anterior auscultation, scattered rhonchi or rales. Gastrointestinal. Abdomen protuberant with no organomegaly, non tender, no rebound or guarding Skin. No rashes Musculoskeletal: no joint deformities     Data Reviewed: I have personally reviewed following labs and imaging studies  CBC: Recent Labs  Lab 03/04/18 0844 03/04/18 2144 03/05/18 0259 03/06/18 0337 03/07/18 0343  WBC 20.1*  --  18.2* 15.7* 14.3*  NEUTROABS  --   --   --  13.6* 12.2*  HGB 6.3* 8.1* 8.3* 8.5* 8.5*  HCT 20.8* 25.8* 25.3* 26.6* 27.9*  MCV 95.4  --  88.8 89.6  92.7  PLT 231  --  233 211 086   Basic Metabolic Panel: Recent Labs  Lab 03/05/18 0259 03/06/18 0337 03/07/18 0343  NA 139 139 137  K 3.3* 3.1* 3.0*  CL 106 105 104  CO2 23 24 22   GLUCOSE 97 86 87  BUN 50* 49* 47*  CREATININE 1.45* 1.33* 1.21*  CALCIUM 8.3* 8.1* 8.0*   GFR: Estimated Creatinine Clearance: 55.8 mL/min (A) (by C-G formula based on SCr of 1.21 mg/dL (H)). Liver Function Tests: No results for input(s): AST, ALT, ALKPHOS, BILITOT, PROT, ALBUMIN in the last 168 hours. No results for input(s): LIPASE, AMYLASE in the last 168 hours. No results for input(s): AMMONIA in the last 168 hours. Coagulation Profile: Recent Labs  Lab 03/04/18 0844  INR 2.48   Cardiac Enzymes: No results for input(s): CKTOTAL, CKMB, CKMBINDEX, TROPONINI in the last 168 hours. BNP (last 3 results) No results for input(s): PROBNP in the last 8760 hours. HbA1C: No results for input(s): HGBA1C in the last 72 hours. CBG: Recent Labs  Lab 03/06/18 1238 03/06/18 1631 03/06/18 2208 03/07/18 0755 03/07/18 1200  GLUCAP 91 94 84 83 98   Lipid Profile: No results for input(s): CHOL, HDL, LDLCALC, TRIG, CHOLHDL, LDLDIRECT in the last 72 hours. Thyroid Function Tests: No results  for input(s): TSH, T4TOTAL, FREET4, T3FREE, THYROIDAB in the last 72 hours. Anemia Panel: No results for input(s): VITAMINB12, FOLATE, FERRITIN, TIBC, IRON, RETICCTPCT in the last 72 hours.    Radiology Studies: I have reviewed all of the imaging during this hospital visit personally     Scheduled Meds: . sodium chloride   Intravenous Once  . chlorhexidine  15 mL Mouth Rinse BID  . collagenase   Topical Daily  . diltiazem  90 mg Oral Q6H  . furosemide  80 mg Intravenous BID  . hydrocortisone   Rectal BID  . insulin aspart  0-5 Units Subcutaneous QHS  . insulin aspart  0-9 Units Subcutaneous TID WC  . mouth rinse  15 mL Mouth Rinse q12n4p  . metoprolol tartrate  25 mg Oral BID  . pantoprazole  40 mg  Oral Daily  . polyethylene glycol  17 g Oral Daily  . sodium chloride flush  3 mL Intravenous Q12H   Continuous Infusions:   LOS: 3 days        Jewell Haught Gerome Apley, MD [facility] Pager [phone]

## 2018-03-07 NOTE — Care Management Important Message (Signed)
Important Message  Patient Details  Name: Latasha Guzman MRN: [id number] Date of Birth: [birth year]   Medicare Important Message Given:  Yes    Kerin Salen 03/07/2018, 11:20 AMImportant Message  Patient Details  Name: Latasha Guzman MRN: [id number] Date of Birth: 1945/08/25   Medicare Important Message Given:  Yes    Kerin Salen 03/07/2018, 11:20 AM

## 2018-03-07 NOTE — Plan of Care (Signed)
  Problem: Education: Goal: Knowledge of General Education information will improve Description Including pain rating scale, medication(s)/side effects and non-pharmacologic comfort measures Outcome: Progressing   Problem: Clinical Measurements: Goal: Ability to maintain clinical measurements within normal limits will improve Outcome: Progressing Goal: Will remain free from infection Outcome: Progressing Goal: Diagnostic test results will improve Outcome: Progressing Goal: Respiratory complications will improve Outcome: Progressing Goal: Cardiovascular complication will be avoided Outcome: Progressing   Problem: Elimination: Goal: Will not experience complications related to bowel motility Outcome: Progressing Goal: Will not experience complications related to urinary retention Outcome: Progressing   Problem: Pain Managment: Goal: General experience of comfort will improve Outcome: Progressing   Problem: Skin Integrity: Goal: Risk for impaired skin integrity will decrease Outcome: Progressing

## 2018-03-07 NOTE — Consult Note (Signed)
[facility] Nurse wound consult note Reason for Consult:Acute GI bleed.  Deep tissue injury to sacrococcygeal region with peeling maroon epithelium.  Skin is paper thin and multiple skin tears to arms and leg.  Left great toe with 1 cm x 1 cm x 0.2 cm neuropathic wound Wound type:deep tissue injury Pressure Injury POA: Yes Measurement: 4 cm x 3.5 cm x 0.2 cm peeling epithelium Maroon discoloration Wound bed:see above. Skin tears are pink and moist Drainage (amount, consistency, odor) minimal serosanguinous Periwound:fragile thin skin Dressing procedure/placement/frequency:Cleanse sacral wound with NS and pat dry.  Apply Santyl to wound bed. Cover with NS moist gauze and secure with sacral foam dressing.  Peel back and chagne santyl daily and replace silicone foam every three days and PRN soilage Will not follow at this time.  Please re-consult if needed.  Domenic Moras MSN, RN, FNP-BC [facility]ON Wound, Ostomy, Continence Nurse Pager [phone]

## 2018-03-08 LAB — BASIC METABOLIC PANEL
Anion gap: 10 (ref 5–15)
BUN: 47 mg/dL — ABNORMAL HIGH (ref 8–23)
CO2: 25 mmol/L (ref 22–32)
Calcium: 8.3 mg/dL — ABNORMAL LOW (ref 8.9–10.3)
Chloride: 101 mmol/L (ref 98–111)
Creatinine, Ser: 1.17 mg/dL — ABNORMAL HIGH (ref 0.44–1.00)
GFR calc Af Amer: 54 mL/min — ABNORMAL LOW (ref >60)
GFR calc non Af Amer: 47 mL/min — ABNORMAL LOW (ref >60)
Glucose, Bld: 98 mg/dL (ref 70–99)
Potassium: 3.4 mmol/L — ABNORMAL LOW (ref 3.5–5.1)
Sodium: 136 mmol/L (ref 135–145)

## 2018-03-08 LAB — GLUCOSE, CAPILLARY
Glucose-Capillary: 90 mg/dL (ref 70–99)
Glucose-Capillary: 87 mg/dL (ref 70–99)

## 2018-03-08 MED ORDER — POTASSIUM CHLORIDE CRYS ER 20 MEQ PO TBCR
60.0000 meq | EXTENDED_RELEASE_TABLET | Freq: Once | ORAL | Status: AC
  Administered 2018-03-08: 60 meq via ORAL
  Filled 2018-03-08: qty 3

## 2018-03-08 NOTE — Progress Notes (Signed)
PROGRESS NOTE    Latasha Guzman  CZY:[id number] DOB: 03-11-1945 DOA: 03/04/2018 PCP: Nicoletta Dress, MD    Brief Narrative:  73 year old female who presented dyspnea. She does have a past medical history of atrial fibrillation, type 2 diabetes mellitus, hypertension. [facility],and she was found to be anemic with a hemoglobin of 6.7,along with melanotic stools.Shewas transferred to [facility] for further GI workup.On her initial physical examination blood pressure 119/78, heart rate 104,respiratory rate 18,oxygen saturation 94%.Moist mucous membranes, her lungs had rales bilaterally, heart S1-S2 no gallops, rubs or murmurs, abdomen wassoftand non tender,no lower extremityedema.Sodium 139, potassium3.6, bicarb 23, BUN 15, creatinine1,45,white cell count 20.1 hemoglobin 6.3, hematocrit 20, platelets 231.Chest radiograph with a rightrotation,bilateral interstitial infiltrates with bilateral pleural effusion and vascular congestion.EKG atrial fibrillation heart rate 123, normal axis, positive PVC.   Patient was admitted with the working diagnosis of symptomatic anemia due to lower gi bleed complicated with acute pulmonary edema.   Assessment & Plan:   Principal Problem:   GI bleed Active Problems:   Acute blood loss anemia   Chronic atrial fibrillation   Diabetes mellitus (HCC)   Essential hypertension   GERD (gastroesophageal reflux disease)   Acute diastolic CHF (congestive heart failure) (HCC)   Acute GI bleeding   Pressure injury of skin   Shortness of breath   Hematochezia  1. Acute symptomatic anemia, due to lower GI bleed.No further bleeding and no indication for endoscopic procedure per GI recommendations. Resumed anticoagulation with apixaban. Cont to monitor for bleeding   2. Acute pulmonary edemadue to acute decompensation of chronic heart failure. Echocardiography with preserved LV systolic  function, 55 to 60%,with moderate pericardial effusion.Patient with persistent hypervolemia, continue on increased furosemide at 80 mg IV tid. Pt earlier requested Foley to be removed due to pain. Pt now with bladder outlet obstruction, see below  3. Atrial fibrillation with RVR.Improved rate control with metoprolol 25 mg po bid anddiltiazem 90 mg q 6 h. Resume anticoagulation with apixaban. Continue telemetry monitoring. Stable at present  4. CKD stage 3with hypokelemia. Base cr 1.2.Stable renal function with serum cr today at baseline, with K at 3,0 and serum bicarbonate at 22. Tolerating IV lasix. Renal function gradually improving  5. T2DM. Glucose continue to be well controlled, with fasting glucose at 87. Continue iss for glucose cover and monitoring.   6. Bladder outlet obstruction. Minimal urine output noted with just under 1L on bladder scan. Will perform I/O cath. If bladder remains >300cc on repeat scan, then recommend indwelling foley   DVT prophylaxis: Eliquis Code Status: Full Family Communication: Pt in room, family not at bedside Disposition Plan: Uncertain at this time  Consultants:     Procedures:     Antimicrobials: Anti-infectives (From admission, onward)   None       Subjective: Without complaints. LE still swollen  Objective: Vitals:   03/07/18 2051 03/08/18 0500 03/08/18 0529 03/08/18 1417  BP: (!) 114/58  113/67 109/74  Pulse: 82  92 83  Resp: 20  16 14   Temp: 98 F (36.7 C)  98.7 F (37.1 C) 98.2 F (36.8 C)  TempSrc: Oral  Oral Oral  SpO2: 95%  97% 98%  Weight:  107.5 kg    Height:        Intake/Output Summary (Last 24 hours) at 03/08/2018 1545 Last data filed at 03/08/2018 0730 Gross per 24 hour  Intake 240 ml  Output -  Net 240 ml   Danley Danker  Weights   03/06/18 0500 03/07/18 0500 03/08/18 0500  Weight: 107.4 kg 114.8 kg 107.5 kg    Examination:  General exam: Appears calm and comfortable  Respiratory system: Clear to  auscultation. Respiratory effort normal. Cardiovascular system: S1 & S2 heard, RRR Gastrointestinal system: Abdomen is nondistended, soft and nontender. No organomegaly or masses felt. Normal bowel sounds heard. Central nervous system: Alert and oriented. No focal neurological deficits. Extremities: Symmetric 5 x 5 power, BLE edematous Skin: No rashes, lesions Psychiatry: Judgement and insight appear normal. Mood & affect appropriate.   Data Reviewed: I have personally reviewed following labs and imaging studies  CBC: Recent Labs  Lab 03/04/18 0844 03/04/18 2144 03/05/18 0259 03/06/18 0337 03/07/18 0343  WBC 20.1*  --  18.2* 15.7* 14.3*  NEUTROABS  --   --   --  13.6* 12.2*  HGB 6.3* 8.1* 8.3* 8.5* 8.5*  HCT 20.8* 25.8* 25.3* 26.6* 27.9*  MCV 95.4  --  88.8 89.6 92.7  PLT 231  --  233 211 932   Basic Metabolic Panel: Recent Labs  Lab 03/05/18 0259 03/06/18 0337 03/07/18 0343 03/08/18 0512  NA 139 139 137 136  K 3.3* 3.1* 3.0* 3.4*  CL 106 105 104 101  CO2 23 24 22 25   GLUCOSE 97 86 87 98  BUN 50* 49* 47* 47*  CREATININE 1.45* 1.33* 1.21* 1.17*  CALCIUM 8.3* 8.1* 8.0* 8.3*   GFR: Estimated Creatinine Clearance: 55.7 mL/min (A) (by C-G formula based on SCr of 1.17 mg/dL (H)). Liver Function Tests: No results for input(s): AST, ALT, ALKPHOS, BILITOT, PROT, ALBUMIN in the last 168 hours. No results for input(s): LIPASE, AMYLASE in the last 168 hours. No results for input(s): AMMONIA in the last 168 hours. Coagulation Profile: Recent Labs  Lab 03/04/18 0844  INR 2.48   Cardiac Enzymes: No results for input(s): CKTOTAL, CKMB, CKMBINDEX, TROPONINI in the last 168 hours. BNP (last 3 results) No results for input(s): PROBNP in the last 8760 hours. HbA1C: No results for input(s): HGBA1C in the last 72 hours. CBG: Recent Labs  Lab 03/07/18 1200 03/07/18 1628 03/07/18 2056 03/08/18 0744 03/08/18 1137  GLUCAP 98 118* 102* 90 87   Lipid Profile: No results for  input(s): CHOL, HDL, LDLCALC, TRIG, CHOLHDL, LDLDIRECT in the last 72 hours. Thyroid Function Tests: No results for input(s): TSH, T4TOTAL, FREET4, T3FREE, THYROIDAB in the last 72 hours. Anemia Panel: No results for input(s): VITAMINB12, FOLATE, FERRITIN, TIBC, IRON, RETICCTPCT in the last 72 hours. Sepsis Labs: Recent Labs  Lab 03/04/18 0844 03/04/18 1155  PROCALCITON 0.44  --   LATICACIDVEN 0.5 0.8    Recent Results (from the past 240 hour(s))  MRSA PCR Screening     Status: None   Collection Time: 03/04/18  5:16 AM  Result Value Ref Range Status   MRSA by PCR NEGATIVE NEGATIVE Final    Comment:        The GeneXpert MRSA Assay (FDA approved for NASAL specimens only), is one component of a comprehensive MRSA colonization surveillance program. It is not intended to diagnose MRSA infection nor to guide or monitor treatment for MRSA infections. Performed at [facility], [place] [address], [place], [place] [address]      Radiology Studies: No results found.  Scheduled Meds: . sodium chloride   Intravenous Once  . apixaban  5 mg Oral BID  . chlorhexidine  15 mL Mouth Rinse BID  . collagenase   Topical Daily  . diltiazem  90 mg Oral Q6H  . furosemide  80 mg Intravenous TID  . hydrocortisone   Rectal BID  . insulin aspart  0-5 Units Subcutaneous QHS  . insulin aspart  0-9 Units Subcutaneous TID WC  . mouth rinse  15 mL Mouth Rinse q12n4p  . metoprolol tartrate  25 mg Oral BID  . pantoprazole  40 mg Oral Daily  . polyethylene glycol  17 g Oral Daily  . sodium chloride flush  3 mL Intravenous Q12H   Continuous Infusions:   LOS: 4 days   Marylu Lund, MD [facility] Pager On Amion  If 7PM-7AM, please contact night-coverage 03/08/2018, 3:45 PM

## 2018-03-09 DIAGNOSIS — I10 Essential (primary) hypertension: Secondary | ICD-10-CM

## 2018-03-09 LAB — BASIC METABOLIC PANEL
Anion gap: 11 (ref 5–15)
BUN: 46 mg/dL — ABNORMAL HIGH (ref 8–23)
CHLORIDE: 101 mmol/L (ref 98–111)
CO2: 24 mmol/L (ref 22–32)
Calcium: 8.4 mg/dL — ABNORMAL LOW (ref 8.9–10.3)
Creatinine, Ser: 1.17 mg/dL — ABNORMAL HIGH (ref 0.44–1.00)
GFR calc Af Amer: 54 mL/min — ABNORMAL LOW (ref >60)
GFR calc non Af Amer: 47 mL/min — ABNORMAL LOW (ref >60)
Glucose, Bld: 92 mg/dL (ref 70–99)
Potassium: 3.7 mmol/L (ref 3.5–5.1)
Sodium: 136 mmol/L (ref 135–145)

## 2018-03-09 LAB — CBC
HCT: 29.1 % — ABNORMAL LOW (ref 36.0–46.0)
HEMOGLOBIN: 8.8 g/dL — AB (ref 12.0–15.0)
MCH: 28 pg (ref 26.0–34.0)
MCHC: 30.2 g/dL (ref 30.0–36.0)
MCV: 92.7 fL (ref 80.0–100.0)
Platelets: 233 10*3/uL (ref 150–400)
RBC: 3.14 MIL/uL — ABNORMAL LOW (ref 3.87–5.11)
RDW: 17.6 % — ABNORMAL HIGH (ref 11.5–15.5)
WBC: 14.1 10*3/uL — ABNORMAL HIGH (ref 4.0–10.5)
nRBC: 0 % (ref 0.0–0.2)

## 2018-03-09 LAB — GLUCOSE, CAPILLARY
GLUCOSE-CAPILLARY: 77 mg/dL (ref 70–99)
GLUCOSE-CAPILLARY: 96 mg/dL (ref 70–99)
Glucose-Capillary: 88 mg/dL (ref 70–99)
Glucose-Capillary: 84 mg/dL (ref 70–99)
Glucose-Capillary: 90 mg/dL (ref 70–99)

## 2018-03-09 NOTE — Progress Notes (Signed)
PROGRESS NOTE    Latasha Guzman  IRJ:[id number] DOB: 1945-10-04 DOA: 03/04/2018 PCP: Nicoletta Dress, MD    Brief Narrative:  73 year old female who presented dyspnea. She does have a past medical history of atrial fibrillation, type 2 diabetes mellitus, hypertension. [facility],and she was found to be anemic with a hemoglobin of 6.7,along with melanotic stools.Shewas transferred to [facility] for [facility] workup.On her initial physical examination blood pressure 119/78, heart rate 104,respiratory rate 18,oxygen saturation 94%.Moist mucous membranes, her lungs had rales bilaterally, heart S1-S2 no gallops, rubs or murmurs, abdomen wassoftand non tender,no lower extremityedema.Sodium 139, potassium3.6, bicarb 23, BUN 15, creatinine1,45,white cell count 20.1 hemoglobin 6.3, hematocrit 20, platelets 231.Chest radiograph with a rightrotation,bilateral interstitial infiltrates with bilateral pleural effusion and vascular congestion.EKG atrial fibrillation heart rate 123, normal axis, positive PVC.   Patient was admitted with the working diagnosis of symptomatic anemia due to lower gi bleed complicated with acute pulmonary edema.   Assessment & Plan:   Principal Problem:   GI bleed Active Problems:   Acute blood loss anemia   Chronic atrial fibrillation   Diabetes mellitus (HCC)   Essential hypertension   GERD (gastroesophageal reflux disease)   Acute diastolic CHF (congestive heart failure) (HCC)   Acute GI bleeding   Pressure injury of skin   Shortness of breath   Hematochezia  1. Acute symptomatic anemia, due to lower GI bleed.No further bleeding and no indication for endoscopic procedure per GI recommendations. Resumed anticoagulation with apixaban. Hgb remains stable  2. Acute pulmonary edemadue to acute decompensation of chronic heart failure. Echocardiography with preserved LV systolic function, 55 to  60%,with moderate pericardial effusion.Patient with persistent hypervolemia, continue on increased furosemide at 80 mg IV tid. Pt earlier requested Foley to be removed due to pain. Pt with bladder outlet obstruction. Indwelling cath placed 1/16 -Wt today 104.3kg from 107.5kg yesterday  3. Atrial fibrillation with RVR.Improved rate control with metoprolol 25 mg po bid anddiltiazem 90 mg q 6 h. Resume anticoagulation with apixaban. Continue telemetry monitoring. Remains stable  4. CKD stage 3with hypokelemia. Base cr 1.2.Stable renal function with serum cr today at baseline, with K at 3,0 and serum bicarbonate at 22. Tolerating IV lasix. Renal function improved  5. T2DM. Glucose continue to be well controlled, with fasting glucose at 87. Continue iss for glucose cover and monitoring.   6. Bladder outlet obstruction. Urine obstruction noted on bladder scan. Indwelling cath placed. Stable   DVT prophylaxis: Eliquis Code Status: Full Family Communication: Pt in room, family not at bedside Disposition Plan: Uncertain at this time  Consultants:     Procedures:     Antimicrobials: Anti-infectives (From admission, onward)   None      Subjective: Denies sob, still with LE swelling  Objective: Vitals:   03/08/18 1417 03/08/18 2307 03/09/18 0438 03/09/18 1239  BP: 109/74 120/65 108/62 114/71  Pulse: 83 80 80 91  Resp: 14 (!) 24 16 20   Temp: 98.2 F (36.8 C) 98 F (36.7 C) 98 F (36.7 C) 97.9 F (36.6 C)  TempSrc: Oral Oral Oral Oral  SpO2: 98% 98% 97% 97%  Weight:   104.3 kg   Height:        Intake/Output Summary (Last 24 hours) at 03/09/2018 1551 Last data filed at 03/09/2018 1511 Gross per 24 hour  Intake -  Output 1800 ml  Net -1800 ml   Filed Weights   03/07/18 0500 03/08/18 0500 03/09/18 0438  Weight:  114.8 kg 107.5 kg 104.3 kg    Examination: General exam: Awake, laying in bed, in nad Respiratory system: Normal respiratory effort, no  wheezing Cardiovascular system: regular rate, s1, s2 Gastrointestinal system: Soft, nondistended, positive BS Central nervous system: CN2-12 grossly intact, strength intact Extremities: Perfused, no clubbing, BLE edema Skin: Normal skin turgor, no notable skin lesions seen Psychiatry: Mood normal // no visual hallucinations   Data Reviewed: I have personally reviewed following labs and imaging studies  CBC: Recent Labs  Lab 03/04/18 0844 03/04/18 2144 03/05/18 0259 03/06/18 0337 03/07/18 0343 03/09/18 0446  WBC 20.1*  --  18.2* 15.7* 14.3* 14.1*  NEUTROABS  --   --   --  13.6* 12.2*  --   HGB 6.3* 8.1* 8.3* 8.5* 8.5* 8.8*  HCT 20.8* 25.8* 25.3* 26.6* 27.9* 29.1*  MCV 95.4  --  88.8 89.6 92.7 92.7  PLT 231  --  233 211 193 701   Basic Metabolic Panel: Recent Labs  Lab 03/05/18 0259 03/06/18 0337 03/07/18 0343 03/08/18 0512 03/09/18 0446  NA 139 139 137 136 136  K 3.3* 3.1* 3.0* 3.4* 3.7  CL 106 105 104 101 101  CO2 23 24 22 25 24   GLUCOSE 97 86 87 98 92  BUN 50* 49* 47* 47* 46*  CREATININE 1.45* 1.33* 1.21* 1.17* 1.17*  CALCIUM 8.3* 8.1* 8.0* 8.3* 8.4*   GFR: Estimated Creatinine Clearance: 54.8 mL/min (A) (by C-G formula based on SCr of 1.17 mg/dL (H)). Liver Function Tests: No results for input(s): AST, ALT, ALKPHOS, BILITOT, PROT, ALBUMIN in the last 168 hours. No results for input(s): LIPASE, AMYLASE in the last 168 hours. No results for input(s): AMMONIA in the last 168 hours. Coagulation Profile: Recent Labs  Lab 03/04/18 0844  INR 2.48   Cardiac Enzymes: No results for input(s): CKTOTAL, CKMB, CKMBINDEX, TROPONINI in the last 168 hours. BNP (last 3 results) No results for input(s): PROBNP in the last 8760 hours. HbA1C: No results for input(s): HGBA1C in the last 72 hours. CBG: Recent Labs  Lab 03/08/18 0744 03/08/18 1137 03/08/18 2309 03/09/18 0800 03/09/18 1154  GLUCAP 90 87 88 84 77   Lipid Profile: No results for input(s): CHOL, HDL,  LDLCALC, TRIG, CHOLHDL, LDLDIRECT in the last 72 hours. Thyroid Function Tests: No results for input(s): TSH, T4TOTAL, FREET4, T3FREE, THYROIDAB in the last 72 hours. Anemia Panel: No results for input(s): VITAMINB12, FOLATE, FERRITIN, TIBC, IRON, RETICCTPCT in the last 72 hours. Sepsis Labs: Recent Labs  Lab 03/04/18 0844 03/04/18 1155  PROCALCITON 0.44  --   LATICACIDVEN 0.5 0.8    Recent Results (from the past 240 hour(s))  MRSA PCR Screening     Status: None   Collection Time: 03/04/18  5:16 AM  Result Value Ref Range Status   MRSA by PCR NEGATIVE NEGATIVE Final    Comment:        The GeneXpert MRSA Assay (FDA approved for NASAL specimens only), is one component of a comprehensive MRSA colonization surveillance program. It is not intended to diagnose MRSA infection nor to guide or monitor treatment for MRSA infections. Performed at [facility], [place] [address], [place],  [address]      Radiology Studies: No results found.  Scheduled Meds: . sodium chloride   Intravenous Once  . apixaban  5 mg Oral BID  . chlorhexidine  15 mL Mouth Rinse BID  . collagenase   Topical Daily  . diltiazem  90 mg Oral Q6H  .  furosemide  80 mg Intravenous TID  . hydrocortisone   Rectal BID  . insulin aspart  0-5 Units Subcutaneous QHS  . insulin aspart  0-9 Units Subcutaneous TID WC  . mouth rinse  15 mL Mouth Rinse q12n4p  . metoprolol tartrate  25 mg Oral BID  . pantoprazole  40 mg Oral Daily  . polyethylene glycol  17 g Oral Daily  . sodium chloride flush  3 mL Intravenous Q12H   Continuous Infusions:   LOS: 5 days   Marylu Lund, MD [facility] Pager On Amion  If 7PM-7AM, please contact night-coverage 03/09/2018, 3:51 PM

## 2018-03-09 NOTE — Plan of Care (Addendum)
Pt Had a BM and urinated. Unable to measure urine. Bladder scan after episode showed 558 mls residual. Pt given 80 mg lasix @ 0025. Pt unable to urinate, but had 2 BM's and may have urinated a sm amt. Another BS showed 427, however, with pt's obesity the result may not have been accurate. Notified on call for order to I&O cath. I&O cath yielded 950 mls of dark murky urine.

## 2018-03-10 LAB — BASIC METABOLIC PANEL
Anion gap: 10 (ref 5–15)
BUN: 47 mg/dL — ABNORMAL HIGH (ref 8–23)
CALCIUM: 8.5 mg/dL — AB (ref 8.9–10.3)
CO2: 26 mmol/L (ref 22–32)
Chloride: 100 mmol/L (ref 98–111)
Creatinine, Ser: 1.09 mg/dL — ABNORMAL HIGH (ref 0.44–1.00)
GFR calc Af Amer: 59 mL/min — ABNORMAL LOW (ref >60)
GFR calc non Af Amer: 51 mL/min — ABNORMAL LOW (ref >60)
Glucose, Bld: 96 mg/dL (ref 70–99)
Potassium: 3.3 mmol/L — ABNORMAL LOW (ref 3.5–5.1)
Sodium: 136 mmol/L (ref 135–145)

## 2018-03-10 LAB — GLUCOSE, CAPILLARY
Glucose-Capillary: 86 mg/dL (ref 70–99)
Glucose-Capillary: 94 mg/dL (ref 70–99)
Glucose-Capillary: 84 mg/dL (ref 70–99)

## 2018-03-10 MED ORDER — POTASSIUM CHLORIDE 20 MEQ/15ML (10%) PO SOLN
40.0000 meq | Freq: Two times a day (BID) | ORAL | Status: AC
  Administered 2018-03-10 (×2): 40 meq via ORAL
  Filled 2018-03-10 (×2): qty 30

## 2018-03-10 MED ORDER — POTASSIUM CHLORIDE CRYS ER 20 MEQ PO TBCR
40.0000 meq | EXTENDED_RELEASE_TABLET | Freq: Two times a day (BID) | ORAL | Status: DC
  Filled 2018-03-10: qty 2

## 2018-03-10 NOTE — Progress Notes (Signed)
PROGRESS NOTE    Latasha Guzman  YCX:[id number] DOB: 10-06-1945 DOA: 03/04/2018 PCP: Nicoletta Dress, MD    Brief Narrative:  73 year old female who presented dyspnea. She does have a past medical history of atrial fibrillation, type 2 diabetes mellitus, hypertension. [facility],and she was found to be anemic with a hemoglobin of 6.7,along with melanotic stools.[facility]was [facility].On her initial physical examination blood pressure 119/78, heart rate 104,respiratory rate 18,oxygen saturation 94%.Moist mucous membranes, her lungs had rales bilaterally, heart S1-S2 no gallops, rubs or murmurs, abdomen wassoftand non tender,no lower extremityedema.Sodium 139, potassium3.6, bicarb 23, BUN 15, creatinine1,45,white cell count 20.1 hemoglobin 6.3, hematocrit 20, platelets 231.Chest radiograph with a rightrotation,bilateral interstitial infiltrates with bilateral pleural effusion and vascular congestion.EKG atrial fibrillation heart rate 123, normal axis, positive PVC.   Patient was admitted with the working diagnosis of symptomatic anemia due to lower gi bleed complicated with acute pulmonary edema.   Assessment & Plan:   Principal Problem:   GI bleed Active Problems:   Acute blood loss anemia   Chronic atrial fibrillation   Diabetes mellitus (HCC)   Essential hypertension   GERD (gastroesophageal reflux disease)   Acute diastolic CHF (congestive heart failure) (HCC)   Acute GI bleeding   Pressure injury of skin   Shortness of breath   Hematochezia  1. Acute symptomatic anemia, due to lower GI bleed.No further bleeding and no indication for endoscopic procedure per GI recommendations. Resumed anticoagulation with apixaban. Hgb thus far stable  2. Acute pulmonary edemadue to acute decompensation of chronic heart failure. Echocardiography with preserved LV systolic function, 55 to  60%,with moderate pericardial effusion.Patient with persistent hypervolemia, continue on increased furosemide at 80 mg IV tid. Pt earlier requested Foley to be removed due to pain. Pt with bladder outlet obstruction. Indwelling cath placed 1/16 -Wt today 102.6kg from 104.3kg yesterday  3. Atrial fibrillation with RVR.Improved rate control with metoprolol 25 mg po bid anddiltiazem 90 mg q 6 h. Continue anticoagulation with apixaban. Continue telemetry monitoring. Currently  4. CKD stage 3with hypokelemia. Base cr 1.2.Stable renal function with serum cr today at baseline, with K at 3,0 and serum bicarbonate at 22. Tolerating IV lasix. Renal function improved. Repeat bmet in AM  5. T2DM. Glucose continue to be well controlled, with fasting glucose at 87. Continue SSI for glucose cover and monitoring.   6. Bladder outlet obstruction. Urine obstruction noted on bladder scan. Indwelling cath placed. Remains stable   DVT prophylaxis: Eliquis Code Status: Full Family Communication: Pt in room, family not at bedside Disposition Plan: Uncertain at this time  Consultants:     Procedures:     Antimicrobials: Anti-infectives (From admission, onward)   None      Subjective: Without sob. Eager to go home. Still with LE swelling  Objective: Vitals:   03/09/18 1239 03/09/18 2038 03/10/18 0446 03/10/18 1333  BP: 114/71 114/64 (!) 112/57 109/77  Pulse: 91 77 79 89  Resp: 20 16 18 20   Temp: 97.9 F (36.6 C) 98.4 F (36.9 C) 98.2 F (36.8 C) 98.7 F (37.1 C)  TempSrc: Oral Oral Oral Oral  SpO2: 97% 97% 97% 98%  Weight:   102.6 kg   Height:        Intake/Output Summary (Last 24 hours) at 03/10/2018 1521 Last data filed at 03/10/2018 1420 Gross per 24 hour  Intake 240 ml  Output 1725 ml  Net -1485 ml   [employer]  03/08/18 0500 03/09/18 0438 03/10/18 0446  Weight: 107.5 kg 104.3 kg 102.6 kg    Examination: General exam: Awake, laying in bed, in nad Respiratory  system: Normal respiratory effort, no wheezing Cardiovascular system: regular rate, s1, s2 Gastrointestinal system: Soft, nondistended, positive BS Central nervous system: CN2-12 grossly intact, strength intact Extremities: Perfused, no clubbing, BLE edema improving Skin: Normal skin turgor, no notable skin lesions seen Psychiatry: Mood normal // no visual hallucinations   Data Reviewed: I have personally reviewed following labs and imaging studies  CBC: Recent Labs  Lab 03/04/18 0844 03/04/18 2144 03/05/18 0259 03/06/18 0337 03/07/18 0343 03/09/18 0446  WBC 20.1*  --  18.2* 15.7* 14.3* 14.1*  NEUTROABS  --   --   --  13.6* 12.2*  --   HGB 6.3* 8.1* 8.3* 8.5* 8.5* 8.8*  HCT 20.8* 25.8* 25.3* 26.6* 27.9* 29.1*  MCV 95.4  --  88.8 89.6 92.7 92.7  PLT 231  --  233 211 193 063   Basic Metabolic Panel: Recent Labs  Lab 03/06/18 0337 03/07/18 0343 03/08/18 0512 03/09/18 0446 03/10/18 0424  NA 139 137 136 136 136  K 3.1* 3.0* 3.4* 3.7 3.3*  CL 105 104 101 101 100  CO2 24 22 25 24 26   GLUCOSE 86 87 98 92 96  BUN 49* 47* 47* 46* 47*  CREATININE 1.33* 1.21* 1.17* 1.17* 1.09*  CALCIUM 8.1* 8.0* 8.3* 8.4* 8.5*   GFR: Estimated Creatinine Clearance: 58.3 mL/min (A) (by C-G formula based on SCr of 1.09 mg/dL (H)). Liver Function Tests: No results for input(s): AST, ALT, ALKPHOS, BILITOT, PROT, ALBUMIN in the last 168 hours. No results for input(s): LIPASE, AMYLASE in the last 168 hours. No results for input(s): AMMONIA in the last 168 hours. Coagulation Profile: Recent Labs  Lab 03/04/18 0844  INR 2.48   Cardiac Enzymes: No results for input(s): CKTOTAL, CKMB, CKMBINDEX, TROPONINI in the last 168 hours. BNP (last 3 results) No results for input(s): PROBNP in the last 8760 hours. HbA1C: No results for input(s): HGBA1C in the last 72 hours. CBG: Recent Labs  Lab 03/09/18 0800 03/09/18 1154 03/09/18 1714 03/09/18 2044 03/10/18 1105  GLUCAP 84 77 90 96 94   Lipid  Profile: No results for input(s): CHOL, HDL, LDLCALC, TRIG, CHOLHDL, LDLDIRECT in the last 72 hours. Thyroid Function Tests: No results for input(s): TSH, T4TOTAL, FREET4, T3FREE, THYROIDAB in the last 72 hours. Anemia Panel: No results for input(s): VITAMINB12, FOLATE, FERRITIN, TIBC, IRON, RETICCTPCT in the last 72 hours. Sepsis Labs: Recent Labs  Lab 03/04/18 0844 03/04/18 1155  PROCALCITON 0.44  --   LATICACIDVEN 0.5 0.8    Recent Results (from the past 240 hour(s))  MRSA PCR Screening     Status: None   Collection Time: 03/04/18  5:16 AM  Result Value Ref Range Status   MRSA by PCR NEGATIVE NEGATIVE Final    Comment:        The GeneXpert MRSA Assay (FDA approved for NASAL specimens only), is one component of a comprehensive MRSA colonization surveillance program. It is not intended to diagnose MRSA infection nor to guide or monitor treatment for MRSA infections. Performed at [facility], [place] [address], [place], [place] [address]      Radiology Studies: No results found.  Scheduled Meds: . sodium chloride   Intravenous Once  . apixaban  5 mg Oral BID  . chlorhexidine  15 mL Mouth Rinse BID  . collagenase   Topical Daily  .  diltiazem  90 mg Oral Q6H  . furosemide  80 mg Intravenous TID  . hydrocortisone   Rectal BID  . insulin aspart  0-5 Units Subcutaneous QHS  . insulin aspart  0-9 Units Subcutaneous TID WC  . mouth rinse  15 mL Mouth Rinse q12n4p  . metoprolol tartrate  25 mg Oral BID  . pantoprazole  40 mg Oral Daily  . polyethylene glycol  17 g Oral Daily  . potassium chloride  40 mEq Oral BID  . sodium chloride flush  3 mL Intravenous Q12H   Continuous Infusions:   LOS: 6 days   Marylu Lund, MD [facility] Pager On Amion  If 7PM-7AM, please contact night-coverage 03/10/2018, 3:21 PM

## 2018-03-10 NOTE — Care Management Important Message (Signed)
Important Message  Patient Details  Name: Latasha Guzman MRN: [id number] Date of Birth: May 06, 1945   Medicare Important Message Given:  Yes    Shanitra Phillippi 03/10/2018, 9:26 AM

## 2018-03-11 LAB — BASIC METABOLIC PANEL
Anion gap: 8 (ref 5–15)
BUN: 48 mg/dL — ABNORMAL HIGH (ref 8–23)
CO2: 26 mmol/L (ref 22–32)
Calcium: 8.4 mg/dL — ABNORMAL LOW (ref 8.9–10.3)
Chloride: 101 mmol/L (ref 98–111)
Creatinine, Ser: 1.14 mg/dL — ABNORMAL HIGH (ref 0.44–1.00)
GFR calc Af Amer: 56 mL/min — ABNORMAL LOW (ref >60)
GFR calc non Af Amer: 48 mL/min — ABNORMAL LOW (ref >60)
Glucose, Bld: 84 mg/dL (ref 70–99)
Potassium: 3.8 mmol/L (ref 3.5–5.1)
Sodium: 135 mmol/L (ref 135–145)

## 2018-03-11 LAB — GLUCOSE, CAPILLARY
Glucose-Capillary: 89 mg/dL (ref 70–99)
Glucose-Capillary: 76 mg/dL (ref 70–99)
Glucose-Capillary: 94 mg/dL (ref 70–99)
Glucose-Capillary: 96 mg/dL (ref 70–99)
Glucose-Capillary: 103 mg/dL — ABNORMAL HIGH (ref 70–99)

## 2018-03-11 NOTE — Plan of Care (Signed)
Patient much more comfortable after being placed on air mattress.  Continues to diurese.  Family members at bedside for part of shift.

## 2018-03-11 NOTE — Progress Notes (Signed)
PROGRESS NOTE    JANAYE CORP  YIR:[id number] DOB: 01-13-1946 DOA: 03/04/2018 PCP: Nicoletta Dress, MD    Brief Narrative:  73 year old female who presented dyspnea. She does have a past medical history of atrial fibrillation, type 2 diabetes mellitus, hypertension. [facility],and she was found to be anemic with a hemoglobin of 6.7,along with melanotic stools.[facility]was [facility].On her initial physical examination blood pressure 119/78, heart rate 104,respiratory rate 18,oxygen saturation 94%.Moist mucous membranes, her lungs had rales bilaterally, heart S1-S2 no gallops, rubs or murmurs, abdomen wassoftand non tender,no lower extremityedema.Sodium 139, potassium3.6, bicarb 23, BUN 15, creatinine1,45,white cell count 20.1 hemoglobin 6.3, hematocrit 20, platelets 231.Chest radiograph with a rightrotation,bilateral interstitial infiltrates with bilateral pleural effusion and vascular congestion.EKG atrial fibrillation heart rate 123, normal axis, positive PVC.   Patient was admitted with the working diagnosis of symptomatic anemia due to lower gi bleed complicated with acute pulmonary edema.   Assessment & Plan:   Principal Problem:   GI bleed Active Problems:   Acute blood loss anemia   Chronic atrial fibrillation   Diabetes mellitus (HCC)   Essential hypertension   GERD (gastroesophageal reflux disease)   Acute diastolic CHF (congestive heart failure) (HCC)   Acute GI bleeding   Pressure injury of skin   Shortness of breath   Hematochezia  1. Acute symptomatic anemia, due to lower GI bleed.No further bleeding and no indication for endoscopic procedure per GI recommendations. Resumed anticoagulation with apixaban. Hgb has remained stable  2. Acute pulmonary edemadue to acute decompensation of chronic heart failure. Echocardiography with preserved LV systolic function, 55  to 60%,with moderate pericardial effusion.Patient with persistent hypervolemia, continue on increased furosemide at 80 mg IV tid. Pt earlier requested Foley to be removed due to pain. Pt with bladder outlet obstruction. Indwelling cath placed 1/16 -Wt today 102.46kg from 102.6kg yesterday, stable  3. Atrial fibrillation with RVR.Improved rate control with metoprolol 25 mg po bid anddiltiazem 90 mg q 6 h. Continue anticoagulation with apixaban. Continue telemetry monitoring. Remains rate controlled  4. CKD stage 3with hypokelemia. Base cr 1.2.Stable renal function with serum cr today at baseline, Tolerating IV lasix. Renal function improved. Repeat bmet in AM  5. T2DM. Glucose continue to be well controlled, with fasting glucose at 87. Continue SSI as tolerated. Stable and controlled   6. Bladder outlet obstruction. Urine obstruction noted on bladder scan. Indwelling cath placed. Remains stable. Foley wean as outpateint   DVT prophylaxis: Eliquis Code Status: Full Family Communication: Pt in room, family not at bedside Disposition Plan: Uncertain at this time  Consultants:     Procedures:     Antimicrobials: Anti-infectives (From admission, onward)   None      Subjective: Asking about being discharged. Still has swelling  Objective: Vitals:   03/11/18 0500 03/11/18 1110 03/11/18 1111 03/11/18 1304  BP:   116/72 116/71  Pulse:   (!) 118 91  Resp:    18  Temp:    98.2 F (36.8 C)  TempSrc:    Oral  SpO2:  (!) 87% 94% 98%  Weight: 102.5 kg     Height:        Intake/Output Summary (Last 24 hours) at 03/11/2018 1629 Last data filed at 03/11/2018 1628 Gross per 24 hour  Intake 240 ml  Output 1950 ml  Net -1710 ml   Filed Weights   03/09/18 0438 03/10/18 0446 03/11/18 0500  Weight: 104.3 kg 102.6  kg 102.5 kg    Examination: General exam: Conversant, in no acute distress Respiratory system: normal chest rise, clear, no audible wheezing Cardiovascular  system: regular rhythm, s1-s2 Gastrointestinal system: Nondistended, nontender, pos BS Central nervous system: No seizures, no tremors Extremities: No cyanosis, no joint deformities, BLE edema Skin: No rashes, no pallor Psychiatry: Affect normal // no auditory hallucinations   Data Reviewed: I have personally reviewed following labs and imaging studies  CBC: Recent Labs  Lab 03/04/18 2144 03/05/18 0259 03/06/18 0337 03/07/18 0343 03/09/18 0446  WBC  --  18.2* 15.7* 14.3* 14.1*  NEUTROABS  --   --  13.6* 12.2*  --   HGB 8.1* 8.3* 8.5* 8.5* 8.8*  HCT 25.8* 25.3* 26.6* 27.9* 29.1*  MCV  --  88.8 89.6 92.7 92.7  PLT  --  233 211 193 242   Basic Metabolic Panel: Recent Labs  Lab 03/07/18 0343 03/08/18 0512 03/09/18 0446 03/10/18 0424 03/11/18 0418  NA 137 136 136 136 135  K 3.0* 3.4* 3.7 3.3* 3.8  CL 104 101 101 100 101  CO2 22 25 24 26 26   GLUCOSE 87 98 92 96 84  BUN 47* 47* 46* 47* 48*  CREATININE 1.21* 1.17* 1.17* 1.09* 1.14*  CALCIUM 8.0* 8.3* 8.4* 8.5* 8.4*   GFR: Estimated Creatinine Clearance: 55.7 mL/min (A) (by C-G formula based on SCr of 1.14 mg/dL (H)). Liver Function Tests: No results for input(s): AST, ALT, ALKPHOS, BILITOT, PROT, ALBUMIN in the last 168 hours. No results for input(s): LIPASE, AMYLASE in the last 168 hours. No results for input(s): AMMONIA in the last 168 hours. Coagulation Profile: No results for input(s): INR, PROTIME in the last 168 hours. Cardiac Enzymes: No results for input(s): CKTOTAL, CKMB, CKMBINDEX, TROPONINI in the last 168 hours. BNP (last 3 results) No results for input(s): PROBNP in the last 8760 hours. HbA1C: No results for input(s): HGBA1C in the last 72 hours. CBG: Recent Labs  Lab 03/10/18 1105 03/10/18 1706 03/10/18 2141 03/11/18 0853 03/11/18 1146  GLUCAP 94 84 89 76 94   Lipid Profile: No results for input(s): CHOL, HDL, LDLCALC, TRIG, CHOLHDL, LDLDIRECT in the last 72 hours. Thyroid Function Tests: No  results for input(s): TSH, T4TOTAL, FREET4, T3FREE, THYROIDAB in the last 72 hours. Anemia Panel: No results for input(s): VITAMINB12, FOLATE, FERRITIN, TIBC, IRON, RETICCTPCT in the last 72 hours. Sepsis Labs: No results for input(s): PROCALCITON, LATICACIDVEN in the last 168 hours.  Recent Results (from the past 240 hour(s))  MRSA PCR Screening     Status: None   Collection Time: 03/04/18  5:16 AM  Result Value Ref Range Status   MRSA by PCR NEGATIVE NEGATIVE Final    Comment:        The GeneXpert MRSA Assay (FDA approved for NASAL specimens only), is one component of a comprehensive MRSA colonization surveillance program. It is not intended to diagnose MRSA infection nor to guide or monitor treatment for MRSA infections. Performed at [facility], [place] [address] [place], [place] [address]      Radiology Studies: No results found.  Scheduled Meds: . sodium chloride   Intravenous Once  . apixaban  5 mg Oral BID  . chlorhexidine  15 mL Mouth Rinse BID  . collagenase   Topical Daily  . diltiazem  90 mg Oral Q6H  . furosemide  80 mg Intravenous TID  . hydrocortisone   Rectal BID  . insulin aspart  0-5 Units Subcutaneous QHS  . insulin  aspart  0-9 Units Subcutaneous TID WC  . mouth rinse  15 mL Mouth Rinse q12n4p  . metoprolol tartrate  25 mg Oral BID  . pantoprazole  40 mg Oral Daily  . polyethylene glycol  17 g Oral Daily  . sodium chloride flush  3 mL Intravenous Q12H   Continuous Infusions:   LOS: 7 days   Marylu Lund, MD [facility] Pager On Amion  If 7PM-7AM, please contact night-coverage 03/11/2018, 4:29 PM

## 2018-03-11 NOTE — Progress Notes (Signed)
PT Cancellation Note  Patient Details Name: Latasha Guzman MRN: [id number] DOB: 1945-05-16   Cancelled Treatment:    Reason Eval/Treat Not Completed: Other (comment).  Pt was getting changed onto an air mattress when PT initially attempted, then when PT returned pt just stated she did not want to do therapy today.  Will try again tomorrow.   Ramond Dial 03/11/2018, 1:48 PM   [name], PT MS Acute Rehab Dept. Number: [place] and [place]

## 2018-03-12 LAB — GLUCOSE, CAPILLARY
GLUCOSE-CAPILLARY: 81 mg/dL (ref 70–99)
Glucose-Capillary: 90 mg/dL (ref 70–99)
Glucose-Capillary: 90 mg/dL (ref 70–99)
Glucose-Capillary: 96 mg/dL (ref 70–99)

## 2018-03-12 LAB — BASIC METABOLIC PANEL
Anion gap: 9 (ref 5–15)
BUN: 44 mg/dL — ABNORMAL HIGH (ref 8–23)
CO2: 26 mmol/L (ref 22–32)
Calcium: 8.4 mg/dL — ABNORMAL LOW (ref 8.9–10.3)
Chloride: 99 mmol/L (ref 98–111)
Creatinine, Ser: 1.05 mg/dL — ABNORMAL HIGH (ref 0.44–1.00)
GFR calc Af Amer: >60 mL/min (ref >60)
GFR calc non Af Amer: 53 mL/min — ABNORMAL LOW (ref >60)
Glucose, Bld: 96 mg/dL (ref 70–99)
Potassium: 3.2 mmol/L — ABNORMAL LOW (ref 3.5–5.1)
Sodium: 134 mmol/L — ABNORMAL LOW (ref 135–145)

## 2018-03-12 MED ORDER — POTASSIUM CHLORIDE CRYS ER 20 MEQ PO TBCR: 40.0000 meq | EXTENDED_RELEASE_TABLET | Freq: Two times a day (BID) | ORAL | Status: DC

## 2018-03-12 MED ORDER — POTASSIUM CHLORIDE 20 MEQ/15ML (10%) PO SOLN
40.0000 meq | Freq: Two times a day (BID) | ORAL | Status: AC
  Administered 2018-03-12 (×2): 40 meq via ORAL
  Filled 2018-03-12 (×2): qty 30

## 2018-03-12 NOTE — Evaluation (Signed)
Occupational Therapy Evaluation Patient Details Name: Latasha Guzman MRN: [id number] DOB: [birth year] Today's Date: 03/12/2018    History of Present Illness 73 year old female who presented dyspnea.  She does have a past medical history of atrial fibrillation, type 2 diabetes mellitus, pacemaker, hypertension.  She presented to [facility] from assisted living facility, and she was found to be anemic with a hemoglobin of 6.7, along with melanotic stools.  She was transferred to [facility] for further GI workup.   Clinical Impression   PATIENT WAS SEEN FOR SKILLED OT TO ASSESS FOR NEEDS. PATIENT IS REQUIRING EXTENSIVE ASSIST WITH BED MOBILITY AND ADLS. PER NURSING SHE WENT TO A REHAB CENTER FOR 21 DAYS AND THEN WENT TO A REST HOME. PATIENT FAMILY DOES NOT WANT HER TO GO BACK TO THAT FACILITY. PATIENT IS REQUIRING 2 PERSON ASSIST. PATIENT IS MOTIVATED TO WORK WITH OT AND WILL BE FOLLOWED.     Follow Up Recommendations  SNF    Equipment Recommendations  None recommended by OT    Recommendations for Other Services       Precautions / Restrictions Precautions Precautions: Fall Restrictions Weight Bearing Restrictions: No      Mobility Bed Mobility Overal bed mobility: Needs Assistance Bed Mobility: Rolling Rolling: Mod assist;Max assist;+2 for physical assistance         General bed mobility comments: PATIENT IS LIMITED BY EDEMA  AND REQUIRES ASSIST TO MOVE LEGS.  Transfers                      Balance                                           ADL either performed or assessed with clinical judgement   ADL Overall ADL's : Needs assistance/impaired Eating/Feeding: Independent   Grooming: Wash/dry hands;Wash/dry face   Upper Body Bathing: Minimal assistance;Moderate assistance;Bed level   Lower Body Bathing: Total assistance;+2 for physical assistance;Bed level   Upper Body Dressing : Moderate assistance;Bed level   Lower Body Dressing: Total  assistance;+2 for physical assistance;Bed level       Toileting- Clothing Manipulation and Hygiene: Total assistance;+2 for physical assistance;Bed level       Functional mobility during ADLs: (2 PERSON ASSIST WITH ROLLING. MOD TO MAX LEVEL. ) General ADL Comments: PATIENT HAS ALOT OF EDEMA AND PNT IS NOT ABLE TO MOVE LEGS WITHOUT ASIST.      Vision Baseline Vision/History: Cataracts(PATIENT REPORTS SHE HAD CATARACT SX L AND AWAITING R.) Patient Visual Report: No change from baseline       Perception     Praxis      Pertinent Vitals/Pain Pain Assessment: No/denies pain     Hand Dominance Right   Extremity/Trunk Assessment Upper Extremity Assessment Upper Extremity Assessment: Generalized weakness           Communication Communication Communication: No difficulties   Cognition Arousal/Alertness: Awake/alert Behavior During Therapy: WFL for tasks assessed/performed Overall Cognitive Status: No family/caregiver present to determine baseline cognitive functioning                                 General Comments: PATIENT WAS NOT ABLE TO [place] EVEN AFTER BEING TOLD. SHE DID KNOW SHE WAS IN [place]. WAS DISORIENTED TO DATE AND [place] IT WAS [place] 2019.  General Comments       Exercises     Shoulder Instructions      Home Living Family/patient expects to be discharged to:: Skilled nursing facility Living Arrangements: Alone Available Help at Discharge: Personal care attendant(3 times a week for 6 hours a day.) Type of Home: Apartment Home Access: Level entry     Home Layout: One level     Bathroom Shower/Tub: Teacher, early years/pre: Standard     Home Equipment: Tub bench;Grab bars - toilet;Grab bars - tub/shower;Hand held shower head   Additional Comments: [place].       Prior Functioning/Environment Level of Independence: Needs assistance  Gait / Transfers  Assistance Needed: Uses RW for ambulation  ADL's / Homemaking Assistance Needed: PATIENT [place].             OT Problem List: Decreased strength;Decreased activity tolerance;Obesity;Increased edema      OT Treatment/Interventions: Self-care/ADL training;DME and/or AE instruction;Patient/family education    OT Goals(Current goals can be found in the care plan section) Acute Rehab OT Goals Patient Stated Goal: GO TO REHAB OT Goal Formulation: With patient Time For Goal Achievement: 03/26/18 Potential to Achieve Goals: Good ADL Goals Pt Will Perform Grooming: (P) with set-up;sitting Pt Will Perform Upper Body Bathing: (P) with set-up;sitting Pt Will Perform Lower Body Bathing: (P) with mod assist;bed level Pt Will Perform Upper Body Dressing: (P) with set-up;sitting Pt Will Perform Lower Body Dressing: (P) with mod assist;sitting/lateral leans;bed level  OT Frequency: Min 2X/week   Barriers to D/C: Decreased caregiver support          Co-evaluation              AM-PAC OT "6 Clicks" Daily Activity     Outcome Measure Help from another person eating meals?: None Help from another person taking care of personal grooming?: A Little Help from another person toileting, which includes using toliet, bedpan, or urinal?: Total Help from another person bathing (including washing, rinsing, drying)?: A Lot Help from another person to put on and taking off regular upper body clothing?: A Little Help from another person to put on and taking off regular lower body clothing?: Total 6 Click Score: 14   End of Session Nurse Communication: (OK THERAPY)  Activity Tolerance: Patient limited by fatigue Patient left: in bed;with call bell/phone within reach;with nursing/sitter in room  OT Visit Diagnosis: Unsteadiness on feet (R26.81);Other abnormalities of gait and mobility (R26.89);Muscle weakness (generalized) (M62.81);Other (comment)                 Time: 0786-7544 OT Time Calculation (min): 39 min Charges:  OT General Charges $OT Visit: 1 Visit OT Evaluation $OT Eval Low Complexity: 1 Low OT Treatments $Self Care/Home Management : 9-20 mins  6 CLICKS  Braxton Weisbecker 03/12/2018, 11:40 AM

## 2018-03-12 NOTE — Progress Notes (Signed)
PROGRESS NOTE    Latasha Guzman  ZSW:[id number] DOB: August 30, 1945 DOA: 03/04/2018 PCP: Nicoletta Dress, MD    Brief Narrative:  73 year old female who presented dyspnea. She does have a past medical history of atrial fibrillation, type 2 diabetes mellitus, hypertension. [facility],and she was found to be anemic with a hemoglobin of 6.7,along with melanotic stools.Shewas transferred to [facility] further GI workup.On her initial physical examination blood pressure 119/78, heart rate 104,respiratory rate 18,oxygen saturation 94%.Moist mucous membranes, her lungs had rales bilaterally, heart S1-S2 no gallops, rubs or murmurs, abdomen wassoftand non tender,no lower extremityedema.Sodium 139, potassium3.6, bicarb 23, BUN 15, creatinine1,45,white cell count 20.1 hemoglobin 6.3, hematocrit 20, platelets 231.Chest radiograph with a rightrotation,bilateral interstitial infiltrates with bilateral pleural effusion and vascular congestion.EKG atrial fibrillation heart rate 123, normal axis, positive PVC.   Patient was admitted with the working diagnosis of symptomatic anemia due to lower gi bleed complicated with acute pulmonary edema.   Assessment & Plan:   Principal Problem:   GI bleed Active Problems:   Acute blood loss anemia   Chronic atrial fibrillation   Diabetes mellitus (HCC)   Essential hypertension   GERD (gastroesophageal reflux disease)   Acute diastolic CHF (congestive heart failure) (HCC)   Acute GI bleeding   Pressure injury of skin   Shortness of breath   Hematochezia  1. Acute symptomatic anemia, due to lower GI bleed.No further bleeding and no indication for endoscopic procedure per GI recommendations. Resumed anticoagulation with apixaban. Hgb thus far remains stable  2. Acute pulmonary edemadue to acute decompensation of chronic heart failure. Echocardiography with preserved LV systolic function,  55 to 60%,with moderate pericardial effusion.Patient with persistent hypervolemia, continue on increased furosemide at 80 mg IV tid. Pt earlier requested Foley to be removed due to pain. Pt with bladder outlet obstruction. Indwelling cath placed 1/16 -Wt today up to 115.6kg, doubt accurate reading -Continue to diurese, wean O2 as tolerated  3. Atrial fibrillation with RVR.Improved rate control with metoprolol 25 mg po bid anddiltiazem 90 mg q 6 h. Continue anticoagulation with apixaban. Continue telemetry monitoring. Remains rate controlled  4. CKD stage 3with hypokelemia. Base cr 1.2.Stable renal function with serum cr today at baseline, Tolerating IV lasix. Renal function improved. Recheck BMET in AM  5. T2DM. Glucose continue to be well controlled, with fasting glucose at 87. Continue SSI as tolerated. Remains stable at this time  6. Bladder outlet obstruction. Urine obstruction noted on bladder scan. Indwelling cath placed. Remains stable. Foley wean as outpatient after discharge   DVT prophylaxis: Eliquis Code Status: Full Family Communication: Pt in room, family not at bedside Disposition Plan: Uncertain at this time  Consultants:     Procedures:     Antimicrobials: Anti-infectives (From admission, onward)   None      Subjective: Eager about discharge. Still with LE edema but improving  Objective: Vitals:   03/11/18 1304 03/11/18 2100 03/12/18 0500 03/12/18 0635  BP: 116/71 109/72  120/63  Pulse: 91 72  86  Resp: 18 19  19   Temp: 98.2 F (36.8 C) 98.8 F (37.1 C)  98.2 F (36.8 C)  TempSrc: Oral Oral  Oral  SpO2: 98% 98%  99%  Weight:   115.7 kg   Height:        Intake/Output Summary (Last 24 hours) at 03/12/2018 1611 Last data filed at 03/12/2018 1059 Gross per 24 hour  Intake 460 ml  Output 2150 ml  Net -1690  ml   Filed Weights   03/10/18 0446 03/11/18 0500 03/12/18 0500  Weight: 102.6 kg 102.5 kg 115.7 kg    Examination: General exam:  Awake, laying in bed, in nad Respiratory system: Normal respiratory effort, no wheezing Cardiovascular system: regular rate, s1, s2 Gastrointestinal system: Soft, nondistended, positive BS Central nervous system: CN2-12 grossly intact, strength intact Extremities: Perfused, no clubbing, BLE edema, improving Skin: Normal skin turgor, no notable skin lesions seen Psychiatry: Mood normal // no visual hallucinations   Data Reviewed: I have personally reviewed following labs and imaging studies  CBC: Recent Labs  Lab 03/06/18 0337 03/07/18 0343 03/09/18 0446  WBC 15.7* 14.3* 14.1*  NEUTROABS 13.6* 12.2*  --   HGB 8.5* 8.5* 8.8*  HCT 26.6* 27.9* 29.1*  MCV 89.6 92.7 92.7  PLT 211 193 010   Basic Metabolic Panel: Recent Labs  Lab 03/08/18 0512 03/09/18 0446 03/10/18 0424 03/11/18 0418 03/12/18 0434  NA 136 136 136 135 134*  K 3.4* 3.7 3.3* 3.8 3.2*  CL 101 101 100 101 99  CO2 25 24 26 26 26   GLUCOSE 98 92 96 84 96  BUN 47* 46* 47* 48* 44*  CREATININE 1.17* 1.17* 1.09* 1.14* 1.05*  CALCIUM 8.3* 8.4* 8.5* 8.4* 8.4*   GFR: Estimated Creatinine Clearance: 64.5 mL/min (A) (by C-G formula based on SCr of 1.05 mg/dL (H)). Liver Function Tests: No results for input(s): AST, ALT, ALKPHOS, BILITOT, PROT, ALBUMIN in the last 168 hours. No results for input(s): LIPASE, AMYLASE in the last 168 hours. No results for input(s): AMMONIA in the last 168 hours. Coagulation Profile: No results for input(s): INR, PROTIME in the last 168 hours. Cardiac Enzymes: No results for input(s): CKTOTAL, CKMB, CKMBINDEX, TROPONINI in the last 168 hours. BNP (last 3 results) No results for input(s): PROBNP in the last 8760 hours. HbA1C: No results for input(s): HGBA1C in the last 72 hours. CBG: Recent Labs  Lab 03/11/18 1146 03/11/18 1630 03/11/18 2055 03/12/18 0756 03/12/18 1157  GLUCAP 94 96 103* 81 90   Lipid Profile: No results for input(s): CHOL, HDL, LDLCALC, TRIG, CHOLHDL, LDLDIRECT  in the last 72 hours. Thyroid Function Tests: No results for input(s): TSH, T4TOTAL, FREET4, T3FREE, THYROIDAB in the last 72 hours. Anemia Panel: No results for input(s): VITAMINB12, FOLATE, FERRITIN, TIBC, IRON, RETICCTPCT in the last 72 hours. Sepsis Labs: No results for input(s): PROCALCITON, LATICACIDVEN in the last 168 hours.  Recent Results (from the past 240 hour(s))  MRSA PCR Screening     Status: None   Collection Time: 03/04/18  5:16 AM  Result Value Ref Range Status   MRSA by PCR NEGATIVE NEGATIVE Final    Comment:        The GeneXpert MRSA Assay (FDA approved for NASAL specimens only), is one component of a comprehensive MRSA colonization surveillance program. It is not intended to diagnose MRSA infection nor to guide or monitor treatment for MRSA infections. Performed at [facility], [place] [address] [place], [place] [address]      Radiology Studies: No results found.  Scheduled Meds: . sodium chloride   Intravenous Once  . apixaban  5 mg Oral BID  . chlorhexidine  15 mL Mouth Rinse BID  . collagenase   Topical Daily  . diltiazem  90 mg Oral Q6H  . furosemide  80 mg Intravenous TID  . hydrocortisone   Rectal BID  . insulin aspart  0-5 Units Subcutaneous QHS  . insulin aspart  0-9 Units  Subcutaneous TID WC  . mouth rinse  15 mL Mouth Rinse q12n4p  . metoprolol tartrate  25 mg Oral BID  . pantoprazole  40 mg Oral Daily  . polyethylene glycol  17 g Oral Daily  . potassium chloride  40 mEq Oral BID  . sodium chloride flush  3 mL Intravenous Q12H   Continuous Infusions:   LOS: 8 days   Marylu Lund, MD [facility] Pager On Amion  If 7PM-7AM, please contact night-coverage 03/12/2018, 4:11 PM

## 2018-03-13 LAB — BASIC METABOLIC PANEL
Anion gap: 9 (ref 5–15)
BUN: 43 mg/dL — ABNORMAL HIGH (ref 8–23)
CALCIUM: 8.8 mg/dL — AB (ref 8.9–10.3)
CO2: 27 mmol/L (ref 22–32)
Chloride: 100 mmol/L (ref 98–111)
Creatinine, Ser: 1 mg/dL (ref 0.44–1.00)
GFR calc Af Amer: >60 mL/min (ref >60)
GFR calc non Af Amer: 57 mL/min — ABNORMAL LOW (ref >60)
Glucose, Bld: 100 mg/dL — ABNORMAL HIGH (ref 70–99)
Potassium: 3.7 mmol/L (ref 3.5–5.1)
Sodium: 136 mmol/L (ref 135–145)

## 2018-03-13 LAB — GLUCOSE, CAPILLARY
Glucose-Capillary: 84 mg/dL (ref 70–99)
Glucose-Capillary: 90 mg/dL (ref 70–99)
Glucose-Capillary: 99 mg/dL (ref 70–99)
Glucose-Capillary: 89 mg/dL (ref 70–99)

## 2018-03-13 MED ORDER — PNEUMOCOCCAL VAC POLYVALENT 25 MCG/0.5ML IJ INJ
0.5000 mL | INJECTION | INTRAMUSCULAR | Status: DC
  Filled 2018-03-13 (×2): qty 0.5

## 2018-03-13 NOTE — Care Management Important Message (Signed)
Important Message  Patient Details  Name: YARDLEY BELTRAN MRN: [id number] Date of Birth: February 14, 1946   Medicare Important Message Given:  Yes    Kerin Salen 03/13/2018, 1:10 PMImportant Message  Patient Details  Name: JHADE BERKO MRN: [id number] Date of Birth: Dec 01, 1945   Medicare Important Message Given:  Yes    Kerin Salen 03/13/2018, 1:10 PM

## 2018-03-13 NOTE — Evaluation (Addendum)
Physical Therapy Evaluation Patient Details Name: Latasha Guzman MRN: [id number] DOB: [birth year] Today's Date: 03/13/2018   History of Present Illness  73 year old female who presented dyspnea.  She does have a past medical history of atrial fibrillation, type 2 diabetes mellitus, pacemaker, hypertension.  She presented to [facility] from a facility with  a hemoglobin of 6.7, along with melanotic stools, afib.  She was transferred to [facility] for further GI workup.  Clinical Impression  The patient is very weak and deconditioned. LE edema impeding mobility. Assisted Patient with max assist of 1 to sitting at the bed edge. Unable to transfer, will require lift equipment. Pt admitted with above diagnosis. Pt currently with functional limitations due to the deficits listed below (see PT Problem List).  Pt will benefit from skilled PT to increase their independence and safety with mobility to allow discharge to the venue listed below.       Follow Up Recommendations SNF    Equipment Recommendations  None recommended by PT    Recommendations for Other Services       Precautions / Restrictions Precautions Precautions: Fall      Mobility  Bed Mobility Overal bed mobility: Needs Assistance Bed Mobility: Rolling;Sidelying to Sit;Sit to Supine Rolling: Max assist Sidelying to sit: Max assist   Sit to supine: Max assist   General bed mobility comments: assist  to move the legs to bed edge and back onto bed. Assist trunk. patient did not get fully to sitting due to slippery air mattress.  Transfers                 General transfer comment: unable  Ambulation/Gait                Stairs            Wheelchair Mobility    Modified Rankin (Stroke Patients Only)       Balance                                             Pertinent Vitals/Pain Pain Assessment: Faces Faces Pain Scale: Hurts even more Pain Location: right knee Pain Descriptors  / Indicators: Aching;Cramping;Grimacing;Guarding Pain Intervention(s): Monitored during session    Home Living                        Prior Function                 Hand Dominance        Extremity/Trunk Assessment                Communication      Cognition Arousal/Alertness: Awake/alert Behavior During Therapy: WFL for tasks assessed/performed Overall Cognitive Status: No family/caregiver present to determine baseline cognitive functioning                                 General Comments: patient is not quite oriented about time line of place and events      General Comments      Exercises     Assessment/Plan    PT Assessment Patient needs continued PT services  PT Problem List Decreased strength;Decreased range of motion;Decreased activity tolerance;Decreased mobility;Decreased knowledge of precautions;Decreased safety awareness;Decreased knowledge of use of DME;Obesity;Pain  PT Treatment Interventions Functional mobility training;Therapeutic activities;Patient/family education;Therapeutic exercise;Cognitive remediation    PT Goals (Current goals can be found in the Care Plan section)  Acute Rehab PT Goals Patient Stated Goal: GO TO REHAB PT Goal Formulation: With patient Time For Goal Achievement: 03/27/18 Potential to Achieve Goals: Fair    Frequency Min 2X/week   Barriers to discharge        Co-evaluation               AM-PAC PT "6 Clicks" Mobility  Outcome Measure Help needed turning from your back to your side while in a flat bed without using bedrails?: Total Help needed moving from lying on your back to sitting on the side of a flat bed without using bedrails?: Total Help needed moving to and from a bed to a chair (including a wheelchair)?: Total Help needed standing up from a chair using your arms (e.g., wheelchair or bedside chair)?: Total Help needed to walk in hospital room?: Total Help needed  climbing 3-5 steps with a railing? : Total 6 Click Score: 6    End of Session   Activity Tolerance: Patient limited by fatigue;Patient limited by lethargy Patient left: in bed;with call bell/phone within reach Nurse Communication: Mobility status;Need for lift equipment PT Visit Diagnosis: Unsteadiness on feet (R26.81)    Time: 0459-9774 PT Time Calculation (min) (ACUTE ONLY): 22 min   Charges:   PT Evaluation $PT Eval Low Complexity: [place] PT Acute Rehabilitation Services Pager [phone] Office [phone]   Claretha Cooper 03/13/2018, 12:48 PM

## 2018-03-13 NOTE — Progress Notes (Signed)
PROGRESS NOTE    Latasha Guzman  OQH:[id number] DOB: 09-Mar-1945 DOA: 03/04/2018 PCP: Nicoletta Dress, MD    Brief Narrative:  73 year old female who presented dyspnea. She does have a past medical history of atrial fibrillation, type 2 diabetes mellitus, hypertension. [facility],and she was found to be anemic with a hemoglobin of 6.7,along with melanotic stools.[facility]was transferred [facility] workup.On her initial physical examination blood pressure 119/78, heart rate 104,respiratory rate 18,oxygen saturation 94%.Moist mucous membranes, her lungs had rales bilaterally, heart S1-S2 no gallops, rubs or murmurs, abdomen wassoftand non tender,no lower extremityedema.Sodium 139, potassium3.6, bicarb 23, BUN 15, creatinine1,45,white cell count 20.1 hemoglobin 6.3, hematocrit 20, platelets 231.Chest radiograph with a rightrotation,bilateral interstitial infiltrates with bilateral pleural effusion and vascular congestion.EKG atrial fibrillation heart rate 123, normal axis, positive PVC.   Patient was admitted with the working diagnosis of symptomatic anemia due to lower gi bleed complicated with acute pulmonary edema.   Assessment & Plan:   Principal Problem:   GI bleed Active Problems:   Acute blood loss anemia   Chronic atrial fibrillation   Diabetes mellitus (HCC)   Essential hypertension   GERD (gastroesophageal reflux disease)   Acute diastolic CHF (congestive heart failure) (HCC)   Acute GI bleeding   Pressure injury of skin   Shortness of breath   Hematochezia  1. Acute symptomatic anemia, due to lower GI bleed.No further bleeding and no indication for endoscopic procedure per GI recommendations. Resumed anticoagulation with apixaban. Hgb thus far remains stable  2. Acute pulmonary edemadue to acute decompensation of chronic heart failure. Echocardiography with preserved LV systolic function,  55 to 60%,with moderate pericardial effusion.Patient with persistent hypervolemia, continue on increased furosemide at 80 mg IV tid. Pt earlier requested Foley to be removed due to pain. Pt with bladder outlet obstruction. Indwelling cath placed 1/16 -Wt today down to 113.39kg from 115.6kg, doubt accurate readings -Successfully weaned to room air  3. Atrial fibrillation with RVR.Improved rate control with metoprolol 25 mg po bid anddiltiazem 90 mg q 6 h. Continue anticoagulation with apixaban. Continue telemetry monitoring. Remains rate controlled  4. CKD stage 3with hypokelemia. Base cr 1.2.Stable renal function with serum cr today at baseline, Tolerating IV lasix. Renal function improved. Recheck BMET in AM  5. T2DM. Glucose continue to be well controlled, with fasting glucose at 87. Continue SSI as tolerated. Currently stable at this time  6. Bladder outlet obstruction. Urine obstruction noted on bladder scan. Indwelling cath placed. Remains stable. Continue foley cath   DVT prophylaxis: Eliquis Code Status: Full Family Communication: Pt in room, family not at bedside Disposition Plan: Uncertain at this time  Consultants:     Procedures:     Antimicrobials: Anti-infectives (From admission, onward)   None      Subjective: Questioning about discharge  Objective: Vitals:   03/13/18 0500 03/13/18 0509 03/13/18 0525 03/13/18 1317  BP:  (!) 106/53  114/67  Pulse:  88  97  Resp:  20  15  Temp:  97.9 F (36.6 C)  98 F (36.7 C)  TempSrc:  Oral  Oral  SpO2:  95% 92% 95%  Weight: 113.4 kg     Height:        Intake/Output Summary (Last 24 hours) at 03/13/2018 1444 Last data filed at 03/13/2018 1323 Gross per 24 hour  Intake 340 ml  Output 1850 ml  Net -1510 ml   Filed Weights   03/11/18 0500 03/12/18 0500  03/13/18 0500  Weight: 102.5 kg 115.7 kg 113.4 kg    Examination: General exam: Conversant, in no acute distress Respiratory system: normal chest  rise, clear, no audible wheezing Cardiovascular system: regular rhythm, s1-s2 Gastrointestinal system: Nondistended, nontender, pos BS Central nervous system: No seizures, no tremors Extremities: No cyanosis, no joint deformities, BLE edema, improving Skin: No rashes, no pallor Psychiatry: Affect normal // no auditory hallucinations   Data Reviewed: I have personally reviewed following labs and imaging studies  CBC: Recent Labs  Lab 03/07/18 0343 03/09/18 0446  WBC 14.3* 14.1*  NEUTROABS 12.2*  --   HGB 8.5* 8.8*  HCT 27.9* 29.1*  MCV 92.7 92.7  PLT 193 867   Basic Metabolic Panel: Recent Labs  Lab 03/09/18 0446 03/10/18 0424 03/11/18 0418 03/12/18 0434 03/13/18 0430  NA 136 136 135 134* 136  K 3.7 3.3* 3.8 3.2* 3.7  CL 101 100 101 99 100  CO2 24 26 26 26 27   GLUCOSE 92 96 84 96 100*  BUN 46* 47* 48* 44* 43*  CREATININE 1.17* 1.09* 1.14* 1.05* 1.00  CALCIUM 8.4* 8.5* 8.4* 8.4* 8.8*   GFR: Estimated Creatinine Clearance: 67 mL/min (by C-G formula based on SCr of 1 mg/dL). Liver Function Tests: No results for input(s): AST, ALT, ALKPHOS, BILITOT, PROT, ALBUMIN in the last 168 hours. No results for input(s): LIPASE, AMYLASE in the last 168 hours. No results for input(s): AMMONIA in the last 168 hours. Coagulation Profile: No results for input(s): INR, PROTIME in the last 168 hours. Cardiac Enzymes: No results for input(s): CKTOTAL, CKMB, CKMBINDEX, TROPONINI in the last 168 hours. BNP (last 3 results) No results for input(s): PROBNP in the last 8760 hours. HbA1C: No results for input(s): HGBA1C in the last 72 hours. CBG: Recent Labs  Lab 03/12/18 1157 03/12/18 1654 03/12/18 2052 03/13/18 0741 03/13/18 1159  GLUCAP 90 90 96 84 90   Lipid Profile: No results for input(s): CHOL, HDL, LDLCALC, TRIG, CHOLHDL, LDLDIRECT in the last 72 hours. Thyroid Function Tests: No results for input(s): TSH, T4TOTAL, FREET4, T3FREE, THYROIDAB in the last 72 hours. Anemia  Panel: No results for input(s): VITAMINB12, FOLATE, FERRITIN, TIBC, IRON, RETICCTPCT in the last 72 hours. Sepsis Labs: No results for input(s): PROCALCITON, LATICACIDVEN in the last 168 hours.  Recent Results (from the past 240 hour(s))  MRSA PCR Screening     Status: None   Collection Time: 03/04/18  5:16 AM  Result Value Ref Range Status   MRSA by PCR NEGATIVE NEGATIVE Final    Comment:        The GeneXpert MRSA Assay (FDA approved for NASAL specimens only), is one component of a comprehensive MRSA colonization surveillance program. It is not intended to diagnose MRSA infection nor to guide or monitor treatment for MRSA infections. Performed at [facility], [place] [address], [place], [place] [address]      Radiology Studies: No results found.  Scheduled Meds: . sodium chloride   Intravenous Once  . apixaban  5 mg Oral BID  . collagenase   Topical Daily  . diltiazem  90 mg Oral Q6H  . furosemide  80 mg Intravenous TID  . hydrocortisone   Rectal BID  . insulin aspart  0-5 Units Subcutaneous QHS  . insulin aspart  0-9 Units Subcutaneous TID WC  . metoprolol tartrate  25 mg Oral BID  . pantoprazole  40 mg Oral Daily  . [START ON 03/14/2018] pneumococcal 23 valent vaccine  0.5 mL Intramuscular  Tomorrow-1000  . polyethylene glycol  17 g Oral Daily  . sodium chloride flush  3 mL Intravenous Q12H   Continuous Infusions:   LOS: 9 days   Marylu Lund, MD [facility] Pager On Amion  If 7PM-7AM, please contact night-coverage 03/13/2018, 2:44 PM

## 2018-03-14 LAB — GLUCOSE, CAPILLARY
GLUCOSE-CAPILLARY: 91 mg/dL (ref 70–99)
Glucose-Capillary: 86 mg/dL (ref 70–99)
Glucose-Capillary: 90 mg/dL (ref 70–99)
Glucose-Capillary: 115 mg/dL — ABNORMAL HIGH (ref 70–99)

## 2018-03-14 LAB — CBC
HCT: 25.4 % — ABNORMAL LOW (ref 36.0–46.0)
Hemoglobin: 8 g/dL — ABNORMAL LOW (ref 12.0–15.0)
MCH: 28 pg (ref 26.0–34.0)
MCHC: 31.5 g/dL (ref 30.0–36.0)
MCV: 88.8 fL (ref 80.0–100.0)
Platelets: 301 10*3/uL (ref 150–400)
RBC: 2.86 MIL/uL — ABNORMAL LOW (ref 3.87–5.11)
RDW: 18.5 % — ABNORMAL HIGH (ref 11.5–15.5)
WBC: 13.2 10*3/uL — AB (ref 4.0–10.5)
nRBC: 0 % (ref 0.0–0.2)

## 2018-03-14 LAB — BASIC METABOLIC PANEL
Anion gap: 8 (ref 5–15)
BUN: 42 mg/dL — ABNORMAL HIGH (ref 8–23)
CO2: 26 mmol/L (ref 22–32)
Calcium: 8.5 mg/dL — ABNORMAL LOW (ref 8.9–10.3)
Chloride: 100 mmol/L (ref 98–111)
Creatinine, Ser: 0.95 mg/dL (ref 0.44–1.00)
GFR calc Af Amer: >60 mL/min (ref >60)
GFR calc non Af Amer: >60 mL/min (ref >60)
Glucose, Bld: 92 mg/dL (ref 70–99)
Potassium: 3.8 mmol/L (ref 3.5–5.1)
Sodium: 134 mmol/L — ABNORMAL LOW (ref 135–145)

## 2018-03-14 MED ORDER — LIP MEDEX EX OINT
TOPICAL_OINTMENT | CUTANEOUS | Status: DC | PRN
  Administered 2018-03-14: 16:00:00 via TOPICAL
  Filled 2018-03-14: qty 7

## 2018-03-14 NOTE — Progress Notes (Signed)
Occupational Therapy Treatment Patient Details Name: Latasha Guzman MRN: [id number] DOB: 09-15-1945 Today's Date: 03/14/2018    History of present illness 73 year old female who presented dyspnea.  She does have a past medical history of atrial fibrillation, type 2 diabetes mellitus, pacemaker, hypertension.  She presented to [facility] from assisted living facility with  a hemoglobin of 6.7, along with melanotic stools, afib.  She was transferred to [facility] for further GI workup.   OT comments  PATIENT REQUIRED ENCOURAGEMENT TO SIT EOB. PATIENT REQUIRED MOD A WITH SUPINE TO SIT EOB WITH ASSIST FOR LEGS AND TRUNK AND CUES FOR TECHNIQUE. PATIENT DID NOT WANT TO SIT MIDLINE SECONDARY HER BOTTOM WAS HURTING. PATIENT LEANED TO R TO OFF LOAD WEIGHT, PATIENT WAS ABLE TO SIT EOB FOR 4-5 MIN AND THEN REQUESTED TO LAY BACK DOWN. PATIENT REQUIRED MIN/MOC A WITH SIT TO SUPINE. PATIENT REQUIRED 2 PERSON ASSIST TO MOVE PNT UP AND OVER IN BED. PATIENT PERFORMED GROOMING AND UE DRESSING IN BED WITH HEP ELEVATED.   Follow Up Recommendations  SNF    Equipment Recommendations       Recommendations for Other Services      Precautions / Restrictions Precautions Precautions: Fall Restrictions Weight Bearing Restrictions: No       Mobility Bed Mobility     Rolling: Max assist Sidelying to sit: Max assist   Sit to supine: Mod assist   General bed mobility comments: ASSIST TO MOVE LEGS AND BRING UP TRUNK.  Transfers                      Balance                                           ADL either performed or assessed with clinical judgement   ADL       Grooming: Wash/dry hands;Wash/dry face;Oral care;Set up           Upper Body Dressing : Minimal assistance;Moderate assistance;Bed level                   Functional mobility during ADLs: (2 person assist to position patient in bed.) General ADL Comments: PATIENT PERFORM ADLS IN SUPINE. PATIENT DID  SIT EOB BREIFLY BUT WAS LEANING TO R SIDE TO KEEP WEIGHT OFF OF BOTTOM SORE.      Vision       Perception     Praxis      Cognition Arousal/Alertness: Awake/alert Behavior During Therapy: WFL for tasks assessed/performed Overall Cognitive Status: No family/caregiver present to determine baseline cognitive functioning                                 General Comments: patient is not quite oriented about time line of place and events        Exercises     Shoulder Instructions       General Comments      Pertinent Vitals/ Pain          Home Living                                          Prior Functioning/Environment  Frequency           Progress Toward Goals  OT Goals(current goals can now be found in the care plan section)  Progress towards OT goals: Progressing toward goals  Acute Rehab OT Goals Patient Stated Goal: get better  Plan      Co-evaluation                 AM-PAC OT "6 Clicks" Daily Activity     Outcome Measure   Help from another person eating meals?: None Help from another person taking care of personal grooming?: A Little Help from another person toileting, which includes using toliet, bedpan, or urinal?: Total Help from another person bathing (including washing, rinsing, drying)?: A Lot Help from another person to put on and taking off regular upper body clothing?: A Little Help from another person to put on and taking off regular lower body clothing?: Total 6 Click Score: 14    End of Session    OT Visit Diagnosis: Unsteadiness on feet (R26.81);Other abnormalities of gait and mobility (R26.89);Muscle weakness (generalized) (M62.81);Other (comment)   Activity Tolerance Patient limited by pain   Patient Left in bed;with call bell/phone within reach   Nurse Communication (OK THERAPY)        Time: 6962-9528 OT Time Calculation (min): 41 min  Charges: OT General  Charges $OT Visit: 1 Visit OT Treatments $Self Care/Home Management : 41-32 mins  6 CLICKS   Amel Kitch 03/14/2018, 10:30 AM

## 2018-03-14 NOTE — Progress Notes (Signed)
Soap suds enema given per Dr. Wyline Copas. Pt tolerated well. Large amount of soft brown stool followed enema. Dr. Wyline Copas paged and made aware

## 2018-03-14 NOTE — Progress Notes (Signed)
PROGRESS NOTE    Latasha Guzman  GHW:[id number] DOB: November 10, 1945 DOA: 03/04/2018 PCP: Nicoletta Dress, MD    Brief Narrative:  73 year old female who presented dyspnea. She does have a past medical history of atrial fibrillation, type 2 diabetes mellitus, hypertension. [facility],and she was found to be anemic with a hemoglobin of 6.7,along with melanotic stools.[facility]was [facility].On her initial physical examination blood pressure 119/78, heart rate 104,respiratory rate 18,oxygen saturation 94%.Moist mucous membranes, her lungs had rales bilaterally, heart S1-S2 no gallops, rubs or murmurs, abdomen wassoftand non tender,no lower extremityedema.Sodium 139, potassium3.6, bicarb 23, BUN 15, creatinine1,45,white cell count 20.1 hemoglobin 6.3, hematocrit 20, platelets 231.Chest radiograph with a rightrotation,bilateral interstitial infiltrates with bilateral pleural effusion and vascular congestion.EKG atrial fibrillation heart rate 123, normal axis, positive PVC.   Patient was admitted with the working diagnosis of symptomatic anemia due to lower gi bleed complicated with acute pulmonary edema.   Assessment & Plan:   Principal Problem:   GI bleed Active Problems:   Acute blood loss anemia   Chronic atrial fibrillation   Diabetes mellitus (HCC)   Essential hypertension   GERD (gastroesophageal reflux disease)   Acute diastolic CHF (congestive heart failure) (HCC)   Acute GI bleeding   Pressure injury of skin   Shortness of breath   Hematochezia  1. Acute symptomatic anemia, due to lower GI bleed.No further bleeding and no indication for endoscopic procedure per GI recommendations. Resumed anticoagulation with apixaban. Hgb currently remains stable  2. Acute pulmonary edemadue to acute decompensation of chronic heart failure. Echocardiography with preserved LV systolic  function, 55 to 60%,with moderate pericardial effusion.Patient with persistent hypervolemia, continue on increased furosemide at 80 mg IV tid. Pt earlier requested Foley to be removed due to pain. Pt with bladder outlet obstruction. Indwelling cath placed 1/16 -Wt today down to 113.39kg from 115.6kg, doubt accurate readings -Pt now on room air -LE edema is improving nicely, however, pt remains clinically volume overloaded. Continue lasix as tolerated  3. Atrial fibrillation with RVR.Improved rate control with metoprolol 25 mg po bid anddiltiazem 90 mg q 6 h. Continue anticoagulation with apixaban. Continue telemetry monitoring. Presently remains rate controlled  4. CKD stage 3with hypokelemia. Base cr 1.2.Stable renal function with serum cr today at baseline, Tolerating IV lasix. Renal function improved. Recheck BMET in AM  5. T2DM. Glucose continue to be well controlled, with fasting glucose at 87. Continue SSI as tolerated. Currently stable at this time  6. Bladder outlet obstruction. Urine obstruction noted on bladder scan. Indwelling cath placed. Would continue foley cath remainder of this admission with plan for bladder training/weaning foley at SNF   DVT prophylaxis: Eliquis Code Status: Full Family Communication: Pt in room, family not at bedside Disposition Plan: Uncertain at this time  Consultants:     Procedures:     Antimicrobials: Anti-infectives (From admission, onward)   None      Subjective: Eager about discharge. Still with LE swelling, albeit improved  Objective: Vitals:   03/13/18 2320 03/14/18 0445 03/14/18 0448 03/14/18 1434  BP: 101/62 (!) 112/58  (!) 105/52  Pulse: 91 91  84  Resp:  20  20  Temp:  98.7 F (37.1 C)  98.2 F (36.8 C)  TempSrc:  Oral  Oral  SpO2:  95%  92%  Weight:   134.3 kg   Height:        Intake/Output Summary (Last 24 hours) at 03/14/2018  Ulmer filed at 03/14/2018 1331 Gross per 24 hour  Intake 1010 ml    Output 2425 ml  Net -1415 ml   Filed Weights   03/12/18 0500 03/13/18 0500 03/14/18 0448  Weight: 115.7 kg 113.4 kg 134.3 kg    Examination: General exam: Conversant, in no acute distress Respiratory system: normal chest rise, clear, no audible wheezing Cardiovascular system: regular rhythm, s1-s2 Gastrointestinal system: Nondistended, nontender, pos BS Central nervous system: No seizures, no tremors Extremities: No cyanosis, no joint deformities, BLE edema, improving Skin: No rashes, no pallor Psychiatry: Affect normal // no auditory hallucinations   Data Reviewed: I have personally reviewed following labs and imaging studies  CBC: Recent Labs  Lab 03/09/18 0446 03/14/18 0441  WBC 14.1* 13.2*  HGB 8.8* 8.0*  HCT 29.1* 25.4*  MCV 92.7 88.8  PLT 233 798   Basic Metabolic Panel: Recent Labs  Lab 03/10/18 0424 03/11/18 0418 03/12/18 0434 03/13/18 0430 03/14/18 0441  NA 136 135 134* 136 134*  K 3.3* 3.8 3.2* 3.7 3.8  CL 100 101 99 100 100  CO2 26 26 26 27 26   GLUCOSE 96 84 96 100* 92  BUN 47* 48* 44* 43* 42*  CREATININE 1.09* 1.14* 1.05* 1.00 0.95  CALCIUM 8.5* 8.4* 8.4* 8.8* 8.5*   GFR: Estimated Creatinine Clearance: 77.8 mL/min (by C-G formula based on SCr of 0.95 mg/dL). Liver Function Tests: No results for input(s): AST, ALT, ALKPHOS, BILITOT, PROT, ALBUMIN in the last 168 hours. No results for input(s): LIPASE, AMYLASE in the last 168 hours. No results for input(s): AMMONIA in the last 168 hours. Coagulation Profile: No results for input(s): INR, PROTIME in the last 168 hours. Cardiac Enzymes: No results for input(s): CKTOTAL, CKMB, CKMBINDEX, TROPONINI in the last 168 hours. BNP (last 3 results) No results for input(s): PROBNP in the last 8760 hours. HbA1C: No results for input(s): HGBA1C in the last 72 hours. CBG: Recent Labs  Lab 03/13/18 1159 03/13/18 1708 03/13/18 2153 03/14/18 0821 03/14/18 1204  GLUCAP 90 99 89 86 91   Lipid  Profile: No results for input(s): CHOL, HDL, LDLCALC, TRIG, CHOLHDL, LDLDIRECT in the last 72 hours. Thyroid Function Tests: No results for input(s): TSH, T4TOTAL, FREET4, T3FREE, THYROIDAB in the last 72 hours. Anemia Panel: No results for input(s): VITAMINB12, FOLATE, FERRITIN, TIBC, IRON, RETICCTPCT in the last 72 hours. Sepsis Labs: No results for input(s): PROCALCITON, LATICACIDVEN in the last 168 hours.  No results found for this or any previous visit (from the past 240 hour(s)).   Radiology Studies: No results found.  Scheduled Meds: . sodium chloride   Intravenous Once  . apixaban  5 mg Oral BID  . collagenase   Topical Daily  . diltiazem  90 mg Oral Q6H  . furosemide  80 mg Intravenous TID  . hydrocortisone   Rectal BID  . insulin aspart  0-5 Units Subcutaneous QHS  . insulin aspart  0-9 Units Subcutaneous TID WC  . metoprolol tartrate  25 mg Oral BID  . pantoprazole  40 mg Oral Daily  . pneumococcal 23 valent vaccine  0.5 mL Intramuscular Tomorrow-1000  . polyethylene glycol  17 g Oral Daily  . sodium chloride flush  3 mL Intravenous Q12H   Continuous Infusions:   LOS: 10 days   Marylu Lund, MD [facility] Pager On Amion  If 7PM-7AM, please contact night-coverage 03/14/2018, 2:58 PM

## 2018-03-15 DIAGNOSIS — E0801 Diabetes mellitus due to underlying condition with hyperosmolarity with coma: Secondary | ICD-10-CM

## 2018-03-15 DIAGNOSIS — I482 Chronic atrial fibrillation, unspecified: Secondary | ICD-10-CM

## 2018-03-15 DIAGNOSIS — I890 Lymphedema, not elsewhere classified: Secondary | ICD-10-CM

## 2018-03-15 LAB — BASIC METABOLIC PANEL
Anion gap: 9 (ref 5–15)
BUN: 41 mg/dL — ABNORMAL HIGH (ref 8–23)
CO2: 28 mmol/L (ref 22–32)
Calcium: 8.7 mg/dL — ABNORMAL LOW (ref 8.9–10.3)
Chloride: 99 mmol/L (ref 98–111)
Creatinine, Ser: 1 mg/dL (ref 0.44–1.00)
GFR calc Af Amer: >60 mL/min (ref >60)
GFR calc non Af Amer: 57 mL/min — ABNORMAL LOW (ref >60)
Glucose, Bld: 97 mg/dL (ref 70–99)
Potassium: 3.1 mmol/L — ABNORMAL LOW (ref 3.5–5.1)
Sodium: 136 mmol/L (ref 135–145)

## 2018-03-15 LAB — CBC
HEMATOCRIT: 26.5 % — AB (ref 36.0–46.0)
Hemoglobin: 8.1 g/dL — ABNORMAL LOW (ref 12.0–15.0)
MCH: 28.9 pg (ref 26.0–34.0)
MCHC: 30.6 g/dL (ref 30.0–36.0)
MCV: 94.6 fL (ref 80.0–100.0)
Platelets: 339 10*3/uL (ref 150–400)
RBC: 2.8 MIL/uL — ABNORMAL LOW (ref 3.87–5.11)
RDW: 18.7 % — ABNORMAL HIGH (ref 11.5–15.5)
WBC: 15.9 10*3/uL — ABNORMAL HIGH (ref 4.0–10.5)
nRBC: 0 % (ref 0.0–0.2)

## 2018-03-15 LAB — GLUCOSE, CAPILLARY
Glucose-Capillary: 91 mg/dL (ref 70–99)
Glucose-Capillary: 84 mg/dL (ref 70–99)
Glucose-Capillary: 102 mg/dL — ABNORMAL HIGH (ref 70–99)
Glucose-Capillary: 90 mg/dL (ref 70–99)

## 2018-03-15 LAB — MAGNESIUM: Magnesium: 1.8 mg/dL (ref 1.7–2.4)

## 2018-03-15 MED ORDER — POTASSIUM CHLORIDE 20 MEQ/15ML (10%) PO SOLN
40.0000 meq | ORAL | Status: AC
  Administered 2018-03-15 (×2): 40 meq via ORAL
  Filled 2018-03-15 (×2): qty 30

## 2018-03-15 MED ORDER — DICLOFENAC SODIUM 1 % TD GEL
2.0000 g | Freq: Four times a day (QID) | TRANSDERMAL | Status: AC
  Administered 2018-03-15 – 2018-03-16 (×5): 2 g via TOPICAL
  Filled 2018-03-15: qty 100

## 2018-03-15 MED ORDER — POTASSIUM CHLORIDE 20 MEQ PO PACK
40.0000 meq | PACK | ORAL | Status: DC
  Filled 2018-03-15 (×2): qty 2

## 2018-03-15 MED ORDER — POTASSIUM CHLORIDE CRYS ER 20 MEQ PO TBCR
40.0000 meq | EXTENDED_RELEASE_TABLET | ORAL | Status: DC
  Filled 2018-03-15: qty 2

## 2018-03-15 NOTE — Progress Notes (Signed)
PROGRESS NOTE  Latasha Guzman QZR:[id number] DOB: 08-10-1945 DOA: 03/04/2018 PCP: Nicoletta Dress, MD   LOS: 11 days    Brief Narrative / Interim history: 73 year old female with history of chronic A. Fib with pacemaker, DM 2, hypertension, diastolic CHF and lymphedema transferred from [facility] due to symptomatic anemia in the setting of GI bleed.  Hemoglobin 6.3 on arrival here.  Chest x-ray with bilateral interstitial infiltrate with bilateral pleural effusion and vascular congestion.  EKG with atrial fibrillation with RVR to 123 and PVCs.  Per previous provider, GI consulted and recommended observation as bleeding stopped.  Apixaban resumed.  Subjective: No major events overnight of this morning.  Complained about right leg pain earlier this morning.  No complaints of leg pain.  Describes the pain as pinch.  Denies numbness or tingling.  Assessment & Plan: Principal Problem:   GI bleed Active Problems:   Acute blood loss anemia   Chronic atrial fibrillation   Diabetes mellitus (HCC)   Essential hypertension   GERD (gastroesophageal reflux disease)   Acute diastolic CHF (congestive heart failure) (HCC)   Acute GI bleeding   Pressure injury of skin   Shortness of breath   Hematochezia  Acute symptomatic anemia likely due to upper GI bleed: Hemoglobin remained stable after 2 units of blood.  Acute on chronic diastolic CHF exacerbation/pulmonary hypertension: Echo with EF of 55 to 60%, no wall motion abnormalities but some diastolic flattening, severe LAE and PA PP of 64.  Chest x-ray with pulmonary congestion and pleural effusion on admission.  Still with significant lymphedema and dependent edema. -Continue IV Lasix -Daily weight, intake output and renal function  Dependent edema/lymphedema: Concern for anasarca -Obtain limited abdominal ultrasound to exclude ascites.  Patient without history of cirrhosis.  Abdominal exam with symptoms.  Hypokalemia: Likely due to  diuretics -Check magnesium -Replace and recheck  Atrial fibrillation with RVR: RVR resolved. -Continue beta-blocker and CCB -Continue Eliquis  CKD 3: Stable -Monitor closely while on high-dose diuretics  DM2: -CBG monitoring -SSI  Bladder outlet obstruction: Urinary obstruction noted on bladder scan. -Continue Foley while on high-dose diuretics -Daily and attempt voiding trial prior to discharge.   Left hip pain: Could be due to hypokalemia. -Voltaren gel  Scheduled Meds: . sodium chloride   Intravenous Once  . apixaban  5 mg Oral BID  . collagenase   Topical Daily  . diclofenac sodium  2 g Topical QID  . diltiazem  90 mg Oral Q6H  . furosemide  80 mg Intravenous TID  . hydrocortisone   Rectal BID  . insulin aspart  0-5 Units Subcutaneous QHS  . insulin aspart  0-9 Units Subcutaneous TID WC  . metoprolol tartrate  25 mg Oral BID  . pantoprazole  40 mg Oral Daily  . pneumococcal 23 valent vaccine  0.5 mL Intramuscular Tomorrow-1000  . polyethylene glycol  17 g Oral Daily  . potassium chloride  40 mEq Oral Q4H  . sodium chloride flush  3 mL Intravenous Q12H   Continuous Infusions: PRN Meds:.acetaminophen **OR** acetaminophen, albuterol, guaiFENesin-codeine, lip balm, ondansetron **OR** ondansetron (ZOFRAN) IV, traZODone  DVT prophylaxis: On Eliquis for A. fib Code Status: Full code Family Communication: No family member at bedside Disposition Plan: Remains inpatient for aggressive diuresis for the next 48 hours  Consultants:   None  Procedures:   None  Antimicrobials:  None  Objective: Vitals:   03/15/18 0441 03/15/18 0444 03/15/18 0926 03/15/18 1333  BP:  99/62 120/79 97/61  Pulse:  81 84 86  Resp:  14  16  Temp:  98 F (36.7 C)  97.6 F (36.4 C)  TempSrc:  Oral  Oral  SpO2:  95%  100%  Weight: 135.2 kg     Height:        Intake/Output Summary (Last 24 hours) at 03/15/2018 1633 Last data filed at 03/15/2018 1227 Gross per 24 hour  Intake 440  ml  Output 1451 ml  Net -1011 ml   Filed Weights   03/13/18 0500 03/14/18 0448 03/15/18 0441  Weight: 113.4 kg 134.3 kg 135.2 kg    Examination:  GENERAL: Appears well. No acute distress.  HEENT: MMM.  Vision and Hearing grossly intact.  NECK: Supple.  No JVD.  LUNGS:  No IWOB. Good air movement.  Bibasilar crackles HEART:  RRR. Heart sounds normal. ABD: Bowel sounds present. Soft. Non tender.  Dull to percussion MSK/EXT: Lower extremity and dependent edema.  Lymphedema SKIN: no apparent skin lesion.  NEURO: Awake, alert and oriented appropriately.  Bilateral lower extremity weakness PSYCH: Calm.   Data Reviewed: I have independently reviewed following labs and imaging studies   CBC: Recent Labs  Lab 03/09/18 0446 03/14/18 0441 03/15/18 0840  WBC 14.1* 13.2* 15.9*  HGB 8.8* 8.0* 8.1*  HCT 29.1* 25.4* 26.5*  MCV 92.7 88.8 94.6  PLT 233 301 481   Basic Metabolic Panel: Recent Labs  Lab 03/11/18 0418 03/12/18 0434 03/13/18 0430 03/14/18 0441 03/15/18 0409 03/15/18 0840  NA 135 134* 136 134* 136  --   K 3.8 3.2* 3.7 3.8 3.1*  --   CL 101 99 100 100 99  --   CO2 26 26 27 26 28   --   GLUCOSE 84 96 100* 92 97  --   BUN 48* 44* 43* 42* 41*  --   CREATININE 1.14* 1.05* 1.00 0.95 1.00  --   CALCIUM 8.4* 8.4* 8.8* 8.5* 8.7*  --   MG  --   --   --   --   --  1.8   GFR: Estimated Creatinine Clearance: 74.1 mL/min (by C-G formula based on SCr of 1 mg/dL). Liver Function Tests: No results for input(s): AST, ALT, ALKPHOS, BILITOT, PROT, ALBUMIN in the last 168 hours. No results for input(s): LIPASE, AMYLASE in the last 168 hours. No results for input(s): AMMONIA in the last 168 hours. Coagulation Profile: No results for input(s): INR, PROTIME in the last 168 hours. Cardiac Enzymes: No results for input(s): CKTOTAL, CKMB, CKMBINDEX, TROPONINI in the last 168 hours. BNP (last 3 results) No results for input(s): PROBNP in the last 8760 hours. HbA1C: No results for  input(s): HGBA1C in the last 72 hours. CBG: Recent Labs  Lab 03/14/18 1648 03/14/18 2207 03/15/18 0728 03/15/18 1110 03/15/18 1624  GLUCAP 90 115* 91 84 102*   Lipid Profile: No results for input(s): CHOL, HDL, LDLCALC, TRIG, CHOLHDL, LDLDIRECT in the last 72 hours. Thyroid Function Tests: No results for input(s): TSH, T4TOTAL, FREET4, T3FREE, THYROIDAB in the last 72 hours. Anemia Panel: No results for input(s): VITAMINB12, FOLATE, FERRITIN, TIBC, IRON, RETICCTPCT in the last 72 hours. Urine analysis: No results found for: COLORURINE, APPEARANCEUR, LABSPEC, PHURINE, GLUCOSEU, HGBUR, BILIRUBINUR, KETONESUR, PROTEINUR, UROBILINOGEN, NITRITE, LEUKOCYTESUR Sepsis Labs: Invalid input(s): PROCALCITONIN, LACTICIDVEN  No results found for this or any previous visit (from the past 240 hour(s)).    Radiology Studies: No results found.   Taye T. [facility] Pager [phone]  If 7PM-7AM, please contact night-coverage [email or web address] Password  [facility] 03/15/2018, 4:33 PM

## 2018-03-15 NOTE — Progress Notes (Signed)
Physical Therapy Treatment Patient Details Name: Latasha Guzman MRN: [id number] DOB: 11-16-1945 Today's Date: 03/15/2018    History of Present Illness 73 year old female who presented dyspnea.  She does have a past medical history of atrial fibrillation, type 2 diabetes mellitus, pacemaker, hypertension.  She presented to [facility] from assisted living facility with  a hemoglobin of 6.7, along with melanotic stools, afib.  She was transferred to [facility] for further GI workup.    PT Comments    Pt up in recliner on arrival (OOB via maximove with nursing).  Pt agreeable to attempting standing with stedy however upon sitting edge of chair pt reported being too weak and back pain limiting her ability.  Pt repositioned in recliner and performed LE exercises.      Follow Up Recommendations  SNF     Equipment Recommendations  None recommended by PT    Recommendations for Other Services       Precautions / Restrictions Precautions Precautions: Fall    Mobility  Bed Mobility               General bed mobility comments: pt OOB with maximove via nursing  Transfers                 General transfer comment: attempted to assist pt with scooting forward in recliner and sitting upright on edge of chair to stand with stedy however pt reported back pain upon sitting upright and stated she was too weak to attempt standing today  Ambulation/Gait                 Stairs             Wheelchair Mobility    Modified Rankin (Stroke Patients Only)       Balance                                            Cognition Arousal/Alertness: Awake/alert Behavior During Therapy: WFL for tasks assessed/performed Overall Cognitive Status: No family/caregiver present to determine baseline cognitive functioning                                 General Comments: follows simple commands      Exercises General Exercises - Lower  Extremity Ankle Circles/Pumps: AROM;10 reps;Seated;Both Short Arc Quad: AROM;10 reps;Seated;Both Heel Slides: AAROM;10 reps;Both Hip ABduction/ADduction: AAROM;10 reps;Both    General Comments        Pertinent Vitals/Pain Pain Assessment: Faces Faces Pain Scale: Hurts even more Pain Location: back and reports ?sciatica left leg Pain Descriptors / Indicators: Grimacing;Sore Pain Intervention(s): Limited activity within patient's tolerance;Monitored during session    Home Living                      Prior Function            PT Goals (current goals can now be found in the care plan section) Progress towards PT goals: Progressing toward goals    Frequency    Min 2X/week      PT Plan Current plan remains appropriate    Co-evaluation              AM-PAC PT "6 Clicks" Mobility   Outcome Measure  Help needed turning from your back to your side while in a  flat bed without using bedrails?: Total Help needed moving from lying on your back to sitting on the side of a flat bed without using bedrails?: Total Help needed moving to and from a bed to a chair (including a wheelchair)?: Total Help needed standing up from a chair using your arms (e.g., wheelchair or bedside chair)?: Total Help needed to walk in hospital room?: Total Help needed climbing 3-5 steps with a railing? : Total 6 Click Score: 6    End of Session   Activity Tolerance: Patient limited by fatigue;Patient limited by pain Patient left: with call bell/phone within reach;in chair   PT Visit Diagnosis: Muscle weakness (generalized) (M62.81);Unsteadiness on feet (R26.81)     Time: 4734-0370 PT Time Calculation (min) (ACUTE ONLY): 15 min  Charges:  $Therapeutic Exercise: 8-22 mins                    Carmelia Bake, PT, DPT Acute Rehabilitation Services Office: [phone] Pager: [phone]  Trena Platt 03/15/2018, 3:30 PM

## 2018-03-15 NOTE — Progress Notes (Signed)
Pt admitted from [facility] where she has been long term care resident x 2 weeks. Prior to that was in rehab at [facility]- daughter explains insurance stopped covering when pt did not progress further with therapies there. Daughter expresses that during those weeks pt was on a medication which seemed to make her very drowsy (Tramadol), and that since that has been discontinued, pt much more interactive and motivated. With that in mind, pt would like to try rehabilitation again at [facility] if insurance approves. Also expresses that pt has medicaid which is being used for pt's long term care coverage and that if pt not approved for rehab, would plan for pt to return to a facility for long term care. CSW made referrals and will follow up with pt/family for more DC planning.  Sharren Bridge, MSW, LCSW Clinical Social Work 03/15/2018 [phone]

## 2018-03-16 ENCOUNTER — Inpatient Hospital Stay (HOSPITAL_COMMUNITY): Payer: 59

## 2018-03-16 DIAGNOSIS — K7010 Alcoholic hepatitis without ascites: Secondary | ICD-10-CM

## 2018-03-16 DIAGNOSIS — R188 Other ascites: Secondary | ICD-10-CM

## 2018-03-16 LAB — CBC
HCT: 26.1 % — ABNORMAL LOW (ref 36.0–46.0)
Hemoglobin: 7.8 g/dL — ABNORMAL LOW (ref 12.0–15.0)
MCH: 28.3 pg (ref 26.0–34.0)
MCHC: 29.9 g/dL — ABNORMAL LOW (ref 30.0–36.0)
MCV: 94.6 fL (ref 80.0–100.0)
Platelets: 303 10*3/uL (ref 150–400)
RBC: 2.76 MIL/uL — ABNORMAL LOW (ref 3.87–5.11)
RDW: 19.2 % — ABNORMAL HIGH (ref 11.5–15.5)
WBC: 12.9 10*3/uL — ABNORMAL HIGH (ref 4.0–10.5)
nRBC: 0 % (ref 0.0–0.2)

## 2018-03-16 LAB — BASIC METABOLIC PANEL
Anion gap: 8 (ref 5–15)
BUN: 44 mg/dL — AB (ref 8–23)
CO2: 27 mmol/L (ref 22–32)
Calcium: 8.7 mg/dL — ABNORMAL LOW (ref 8.9–10.3)
Chloride: 101 mmol/L (ref 98–111)
Creatinine, Ser: 1.04 mg/dL — ABNORMAL HIGH (ref 0.44–1.00)
GFR calc Af Amer: >60 mL/min (ref >60)
GFR calc non Af Amer: 54 mL/min — ABNORMAL LOW (ref >60)
Glucose, Bld: 92 mg/dL (ref 70–99)
Potassium: 3.8 mmol/L (ref 3.5–5.1)
SODIUM: 136 mmol/L (ref 135–145)

## 2018-03-16 LAB — PREPARE RBC (CROSSMATCH)

## 2018-03-16 LAB — GLUCOSE, CAPILLARY
GLUCOSE-CAPILLARY: 101 mg/dL — AB (ref 70–99)
Glucose-Capillary: 82 mg/dL (ref 70–99)
Glucose-Capillary: 80 mg/dL (ref 70–99)
Glucose-Capillary: 103 mg/dL — ABNORMAL HIGH (ref 70–99)

## 2018-03-16 LAB — MAGNESIUM: MAGNESIUM: 1.8 mg/dL (ref 1.7–2.4)

## 2018-03-16 LAB — ALBUMIN: ALBUMIN: 2 g/dL — AB (ref 3.5–5.0)

## 2018-03-16 MED ORDER — SODIUM CHLORIDE 0.9% IV SOLUTION
Freq: Once | INTRAVENOUS | Status: AC
  Administered 2018-03-16: 13:00:00 via INTRAVENOUS

## 2018-03-16 NOTE — Progress Notes (Signed)
Discussed facility options again with pt's niece- pt would prefer to re-admit to [facility] (and try to rehabilitate with understanding that it still may lead to long term SNF care need) rather than return to [facility] for immediate reinstatement of long term care.  Spoke with admissions at [facility]- confirmed pt's referral reviewed and appropriate for readmission- CSW will inform admissions when pt progressing toward near discharge so that insurance authorization request can be initiated.  Sharren Bridge, MSW, LCSW Clinical Social Work 03/16/2018 [phone]

## 2018-03-16 NOTE — Progress Notes (Signed)
PROGRESS NOTE  Latasha Guzman DUK:[id number] DOB: [birth year] DOA: 03/04/2018 PCP: Nicoletta Dress, MD   LOS: 12 days    Brief Narrative / Interim history: 73 year old female with history of chronic A. Fib with pacemaker, DM 2, hypertension, diastolic CHF and lymphedema transferred from [facility] due to symptomatic anemia in the setting of GI bleed.  Hemoglobin 6.3 on arrival here.  Chest x-ray with bilateral interstitial infiltrate with bilateral pleural effusion and vascular congestion.  EKG with atrial fibrillation with RVR to 123 and PVCs.  Per previous provider, GI consulted and recommended observation as bleeding stopped.  Apixaban resumed. Patient was aggressively diuresed with IV lasix.   Subjective: No major events overnight of this morning.  No complaints this morning.  Fair response to IV Lasix. Abdominal ultrasound with moderate ascites in 4 quadrants.  No history of cirrhosis, hepatitis or alcohol use.  Assessment & Plan: Principal Problem:   GI bleed Active Problems:   Acute blood loss anemia   Chronic atrial fibrillation   Diabetes mellitus (HCC)   Essential hypertension   GERD (gastroesophageal reflux disease)   Acute diastolic CHF (congestive heart failure) (HCC)   Acute GI bleeding   Pressure injury of skin   Shortness of breath   Hematochezia  Acute symptomatic anemia likely due to upper GI bleed: Received 2 units on admission.  Hgb remained fairly stable except for slight drop overnight -Transfuse 1 unit -Posttransfusion H&H  Acute on chronic diastolic CHF exacerbation/pulmonary hypertension: Echo with EF of 55 to 60%, no wall motion abnormalities but some diastolic flattening, severe LAE and PA PP of 64.  Chest x-ray with pulmonary congestion and pleural effusion on admission.  Still with significant lymphedema and dependent edema.  Fair response to high-dose IV Lasix -Continue IV Lasix.  May add albumin to paracentesis. -Daily weight, intake output and renal  function  Ascites: Noted on abdominal ultrasound.  No history of liver disease, hepatitis or cirrhosis.  No signs of SBP Albumin low.  Suspect this to be secondary to CHF -US paracentesis-IR consulted -We will send fluid for cytology, chemistry and microbiology.  Dependent edema/lymphedema: Concern for anasarca -Manage as above  Hypokalemia/hypomagnesemia: Likely due to diuretics -Monitor and replenish  Atrial fibrillation with RVR: RVR resolved. -Continue beta-blocker and CCB -Continue Eliquis  CKD 3: Stable -Monitor closely while on high-dose diuretics  DM2: -CBG monitoring -SSI  Bladder outlet obstruction: Urinary obstruction noted on bladder scan. -Continue Foley while on high-dose diuretics -Daily and attempt voiding trial prior to discharge.   Left hip pain: Could be due to hypokalemia. -Voltaren gel  Scheduled Meds: . sodium chloride   Intravenous Once  . apixaban  5 mg Oral BID  . collagenase   Topical Daily  . diltiazem  90 mg Oral Q6H  . furosemide  80 mg Intravenous TID  . hydrocortisone   Rectal BID  . insulin aspart  0-5 Units Subcutaneous QHS  . insulin aspart  0-9 Units Subcutaneous TID WC  . metoprolol tartrate  25 mg Oral BID  . pantoprazole  40 mg Oral Daily  . pneumococcal 23 valent vaccine  0.5 mL Intramuscular Tomorrow-1000  . polyethylene glycol  17 g Oral Daily  . sodium chloride flush  3 mL Intravenous Q12H   Continuous Infusions: PRN Meds:.acetaminophen **OR** acetaminophen, albuterol, guaiFENesin-codeine, lip balm, ondansetron **OR** ondansetron (ZOFRAN) IV, traZODone  DVT prophylaxis: On Eliquis for A. fib Code Status: Full code Family Communication: No family member at bedside Disposition Plan: Remains inpatient for aggressive  diuresis.   Consultants:   None  Procedures:   None  Antimicrobials:  None  Objective: Vitals:   03/16/18 0611 03/16/18 1257 03/16/18 1319 03/16/18 1339  BP: 94/60 (!) 106/54 (!) 114/50 117/64    Pulse: 73 95 96 80  Resp: 14 16 18 18   Temp: (!) 97.5 F (36.4 C) 98.2 F (36.8 C) 98 F (36.7 C) 98.6 F (37 C)  TempSrc: Oral Oral Oral Oral  SpO2: 95% 93% 96% 97%  Weight: 126.1 kg     Height:        Intake/Output Summary (Last 24 hours) at 03/16/2018 1530 Last data filed at 03/16/2018 1300 Gross per 24 hour  Intake 240 ml  Output 1220 ml  Net -980 ml   Filed Weights   03/14/18 0448 03/15/18 0441 03/16/18 0611  Weight: 134.3 kg 135.2 kg 126.1 kg    Examination: GENERAL: Appears well. No acute distress.  HEENT: MMM.  Vision and Hearing grossly intact.  NECK: Supple.  No JVD.  LUNGS:  No IWOB. Good air movement. CTAB.  HEART:  RRR. Heart sounds normal.  ABD: Bowel sounds present. Soft. Non tender.  Dull to percussion EXT:  Lymphedema. SKIN: no apparent skin lesion.  NEURO: Awake, alert and oriented appropriately.  No gross deficit except for chronic bilateral lower extremity weakness PSYCH: Calm. Normal affect.  Data Reviewed: I have independently reviewed following labs and imaging studies   CBC: Recent Labs  Lab 03/14/18 0441 03/15/18 0840 03/16/18 0513  WBC 13.2* 15.9* 12.9*  HGB 8.0* 8.1* 7.8*  HCT 25.4* 26.5* 26.1*  MCV 88.8 94.6 94.6  PLT 301 339 786   Basic Metabolic Panel: Recent Labs  Lab 03/12/18 0434 03/13/18 0430 03/14/18 0441 03/15/18 0409 03/15/18 0840 03/16/18 0513  NA 134* 136 134* 136  --  136  K 3.2* 3.7 3.8 3.1*  --  3.8  CL 99 100 100 99  --  101  CO2 26 27 26 28   --  27  GLUCOSE 96 100* 92 97  --  92  BUN 44* 43* 42* 41*  --  44*  CREATININE 1.05* 1.00 0.95 1.00  --  1.04*  CALCIUM 8.4* 8.8* 8.5* 8.7*  --  8.7*  MG  --   --   --   --  1.8 1.8   GFR: Estimated Creatinine Clearance: 68.5 mL/min (A) (by C-G formula based on SCr of 1.04 mg/dL (H)). Liver Function Tests: Recent Labs  Lab 03/16/18 0513  ALBUMIN 2.0*   No results for input(s): LIPASE, AMYLASE in the last 168 hours. No results for input(s): AMMONIA in the  last 168 hours. Coagulation Profile: No results for input(s): INR, PROTIME in the last 168 hours. Cardiac Enzymes: No results for input(s): CKTOTAL, CKMB, CKMBINDEX, TROPONINI in the last 168 hours. BNP (last 3 results) No results for input(s): PROBNP in the last 8760 hours. HbA1C: No results for input(s): HGBA1C in the last 72 hours. CBG: Recent Labs  Lab 03/15/18 1110 03/15/18 1624 03/15/18 2156 03/16/18 0751 03/16/18 1110  GLUCAP 84 102* 90 82 101*   Lipid Profile: No results for input(s): CHOL, HDL, LDLCALC, TRIG, CHOLHDL, LDLDIRECT in the last 72 hours. Thyroid Function Tests: No results for input(s): TSH, T4TOTAL, FREET4, T3FREE, THYROIDAB in the last 72 hours. Anemia Panel: No results for input(s): VITAMINB12, FOLATE, FERRITIN, TIBC, IRON, RETICCTPCT in the last 72 hours. Urine analysis: No results found for: COLORURINE, APPEARANCEUR, LABSPEC, PHURINE, GLUCOSEU, HGBUR, BILIRUBINUR, KETONESUR, PROTEINUR, UROBILINOGEN, NITRITE, LEUKOCYTESUR Sepsis  Labs: Invalid input(s): PROCALCITONIN, LACTICIDVEN  No results found for this or any previous visit (from the past 240 hour(s)).    Radiology Studies: [place] Ascites (abdomen Limited)  Result Date: 03/16/2018 CLINICAL DATA:  Anasarca EXAM: LIMITED ABDOMEN ULTRASOUND FOR ASCITES TECHNIQUE: Limited ultrasound survey for ascites was performed in all four abdominal quadrants. COMPARISON:  None. FINDINGS: Ascites is present in all 4 quadrants of the abdomen. Moderate amount of ascites is present. Incidental right pleural effusion is also noted. IMPRESSION: Moderate ascites in all 4 quadrants of the abdomen. Incidental right pleural effusion is noted. Electronically Signed   By: Marybelle Killings M.D.   On: 03/16/2018 08:39   Taye T. [facility] Pager [phone]  If 7PM-7AM, please contact night-coverage [email or web address] Password [email or web address] 03/16/2018, 3:30 PM

## 2018-03-17 ENCOUNTER — Inpatient Hospital Stay (HOSPITAL_COMMUNITY): Payer: 59

## 2018-03-17 LAB — BODY FLUID CELL COUNT WITH DIFFERENTIAL
Lymphs, Fluid: 14 %
Monocyte-Macrophage-Serous Fluid: 35 % — ABNORMAL LOW (ref 50–90)
Neutrophil Count, Fluid: 51 % — ABNORMAL HIGH (ref 0–25)
Total Nucleated Cell Count, Fluid: 341 cu mm (ref 0–1000)

## 2018-03-17 LAB — BASIC METABOLIC PANEL
Anion gap: 9 (ref 5–15)
BUN: 45 mg/dL — ABNORMAL HIGH (ref 8–23)
CHLORIDE: 98 mmol/L (ref 98–111)
CO2: 29 mmol/L (ref 22–32)
Calcium: 9 mg/dL (ref 8.9–10.3)
Creatinine, Ser: 0.96 mg/dL (ref 0.44–1.00)
GFR calc Af Amer: >60 mL/min (ref >60)
GFR calc non Af Amer: 60 mL/min — ABNORMAL LOW (ref >60)
Glucose, Bld: 91 mg/dL (ref 70–99)
Potassium: 3.3 mmol/L — ABNORMAL LOW (ref 3.5–5.1)
SODIUM: 136 mmol/L (ref 135–145)

## 2018-03-17 LAB — LACTATE DEHYDROGENASE, PLEURAL OR PERITONEAL FLUID: LD, Fluid: 60 U/L — ABNORMAL HIGH (ref 3–23)

## 2018-03-17 LAB — AMMONIA: Ammonia: 39 umol/L — ABNORMAL HIGH (ref 9–35)

## 2018-03-17 LAB — GLUCOSE, CAPILLARY
Glucose-Capillary: 87 mg/dL (ref 70–99)
Glucose-Capillary: 89 mg/dL (ref 70–99)
Glucose-Capillary: 84 mg/dL (ref 70–99)
Glucose-Capillary: 109 mg/dL — ABNORMAL HIGH (ref 70–99)

## 2018-03-17 LAB — ALBUMIN, PLEURAL OR PERITONEAL FLUID: Albumin, Fluid: <1 g/dL

## 2018-03-17 LAB — CBC
HCT: 29.4 % — ABNORMAL LOW (ref 36.0–46.0)
Hemoglobin: 9 g/dL — ABNORMAL LOW (ref 12.0–15.0)
MCH: 28.5 pg (ref 26.0–34.0)
MCHC: 30.6 g/dL (ref 30.0–36.0)
MCV: 93 fL (ref 80.0–100.0)
Platelets: 319 10*3/uL (ref 150–400)
RBC: 3.16 MIL/uL — ABNORMAL LOW (ref 3.87–5.11)
RDW: 18.9 % — ABNORMAL HIGH (ref 11.5–15.5)
WBC: 13.2 10*3/uL — ABNORMAL HIGH (ref 4.0–10.5)
nRBC: 0 % (ref 0.0–0.2)

## 2018-03-17 LAB — AMYLASE, PLEURAL OR PERITONEAL FLUID: AMYLASE FL: 36 U/L

## 2018-03-17 LAB — MAGNESIUM: Magnesium: 1.9 mg/dL (ref 1.7–2.4)

## 2018-03-17 MED ORDER — LIDOCAINE HCL 1 % IJ SOLN
INTRAMUSCULAR | Status: AC
  Filled 2018-03-17: qty 20

## 2018-03-17 MED ORDER — POTASSIUM CHLORIDE 20 MEQ/15ML (10%) PO SOLN
40.0000 meq | Freq: Two times a day (BID) | ORAL | Status: DC
  Filled 2018-03-17 (×2): qty 30

## 2018-03-17 NOTE — Progress Notes (Signed)
PROGRESS NOTE  Latasha Guzman VQQ:[id number] DOB: 1945-07-20 DOA: 03/04/2018 PCP: Nicoletta Dress, MD   LOS: 13 days    Brief Narrative / Interim history: 73 year old female with history of chronic A. Fib with pacemaker, DM 2, hypertension, diastolic CHF and lymphedema transferred from [facility] due to symptomatic anemia in the setting of GI bleed.  Hemoglobin 6.3 on arrival here.  Chest x-ray with bilateral interstitial infiltrate with bilateral pleural effusion and vascular congestion.  EKG with atrial fibrillation with RVR to 123 and PVCs.  Per previous provider, GI consulted and recommended observation as bleeding stopped.  Apixaban resumed. Patient was aggressively diuresed with IV lasix.   Subjective: No major events overnight of this morning.  Vital signs stable.  Had blood transfusion with appropriate response yesterday.  Aware of the plan about paracentesis today.  Denies chest pain, shortness of breath or abdominal pain.  Assessment & Plan: Principal Problem:   GI bleed Active Problems:   Acute blood loss anemia   Chronic atrial fibrillation   Diabetes mellitus (HCC)   Essential hypertension   GERD (gastroesophageal reflux disease)   Acute diastolic CHF (congestive heart failure) (HCC)   Acute GI bleeding   Pressure injury of skin   Shortness of breath   Hematochezia  Acute symptomatic anemia likely due to upper GI bleed: Received 2 units on admission and 1 unit on 1/23 with appropriate response. -Continue monitoring  Acute on chronic diastolic CHF exacerbation/pulmonary hypertension: Echo with EF of 55 to 60%, no wall motion abnormalities but some diastolic flattening, severe LAE and PA PP of 64.  Chest x-ray with pulmonary congestion and pleural effusion on admission.  Still with significant lymphedema and dependent edema.  Fair response to high-dose IV Lasix -Continue IV Lasix.  -Daily weight, intake output and renal function -Replenish electrolytes as  appropriate  Ascites: Noted on abdominal ultrasound.  No history of liver disease, hepatitis or cirrhosis.  No signs of SBP Albumin low.  Suspect this to be secondary to CHF -Awaiting US paracentesis -Will add Aldactone to paracentesis. -We will send fluid for cytology, chemistry and microbiology.  Dependent edema/lymphedema: Concern for anasarca -Manage as above  Hypokalemia/hypomagnesemia: Likely due to diuretics.  Hypomagnesemia resolved -Monitor and replenish  Atrial fibrillation with RVR: RVR resolved. -Continue beta-blocker and CCB -Hold Eliquis this morning paracentesis  CKD 3: Stable -Monitor closely while on high-dose diuretics  DM2: -CBG monitoring -SSI  Bladder outlet obstruction: Urinary obstruction noted on bladder scan. -Continue Foley while on high-dose diuretics -Daily and attempt voiding trial prior to discharge.   Left hip pain: Could be due to hypokalemia. -Voltaren gel  Scheduled Meds: . lidocaine      . sodium chloride   Intravenous Once  . apixaban  5 mg Oral BID  . collagenase   Topical Daily  . diltiazem  90 mg Oral Q6H  . furosemide  80 mg Intravenous TID  . hydrocortisone   Rectal BID  . insulin aspart  0-5 Units Subcutaneous QHS  . insulin aspart  0-9 Units Subcutaneous TID WC  . metoprolol tartrate  25 mg Oral BID  . pantoprazole  40 mg Oral Daily  . pneumococcal 23 valent vaccine  0.5 mL Intramuscular Tomorrow-1000  . polyethylene glycol  17 g Oral Daily  . potassium chloride  40 mEq Oral BID  . sodium chloride flush  3 mL Intravenous Q12H   Continuous Infusions: PRN Meds:.acetaminophen **OR** acetaminophen, albuterol, guaiFENesin-codeine, lip balm, ondansetron **OR** ondansetron (ZOFRAN) IV, traZODone  DVT  prophylaxis: On Eliquis for A. fib Code Status: Full code Family Communication: No family member at bedside Disposition Plan: Remains inpatient for IV diuretics and paracentesis.  Consultants:   None  Procedures:    None  Antimicrobials:  None  Objective: Vitals:   03/17/18 1109 03/17/18 1120 03/17/18 1128 03/17/18 1301  BP: (!) 109/58 110/67 109/63 103/60  Pulse:    86  Resp:    16  Temp:    98.2 F (36.8 C)  TempSrc:    Oral  SpO2:    97%  Weight:      Height:        Intake/Output Summary (Last 24 hours) at 03/17/2018 1504 Last data filed at 03/17/2018 1230 Gross per 24 hour  Intake 1095 ml  Output 1970 ml  Net -875 ml   Filed Weights   03/15/18 0441 03/16/18 0611 03/17/18 0542  Weight: 135.2 kg 126.1 kg 132.9 kg    Examination: GENERAL: Lying in bed.  No distress. HEENT: MMM.  Vision and Hearing grossly intact.  NECK: Supple.  No JVD.  LUNGS:  No IWOB. Good air movement. CTAB.  HEART:  RRR. Heart sounds normal.  Dull to percussion ABD: Bowel sounds present. Soft. Non tender.  EXT: Lymphedema SKIN: no apparent skin lesion.  NEURO: Awake, alert and oriented appropriately.  No gross deficit.  PSYCH: Calm. Normal affect. Data Reviewed: I have independently reviewed following labs and imaging studies   CBC: Recent Labs  Lab 03/14/18 0441 03/15/18 0840 03/16/18 0513 03/17/18 0408  WBC 13.2* 15.9* 12.9* 13.2*  HGB 8.0* 8.1* 7.8* 9.0*  HCT 25.4* 26.5* 26.1* 29.4*  MCV 88.8 94.6 94.6 93.0  PLT 301 339 303 938   Basic Metabolic Panel: Recent Labs  Lab 03/13/18 0430 03/14/18 0441 03/15/18 0409 03/15/18 0840 03/16/18 0513 03/17/18 0408  NA 136 134* 136  --  136 136  K 3.7 3.8 3.1*  --  3.8 3.3*  CL 100 100 99  --  101 98  CO2 27 26 28   --  27 29  GLUCOSE 100* 92 97  --  92 91  BUN 43* 42* 41*  --  44* 45*  CREATININE 1.00 0.95 1.00  --  1.04* 0.96  CALCIUM 8.8* 8.5* 8.7*  --  8.7* 9.0  MG  --   --   --  1.8 1.8 1.9   GFR: Estimated Creatinine Clearance: 76.5 mL/min (by C-G formula based on SCr of 0.96 mg/dL). Liver Function Tests: Recent Labs  Lab 03/16/18 0513  ALBUMIN 2.0*   No results for input(s): LIPASE, AMYLASE in the last 168 hours. Recent  Labs  Lab 03/17/18 1208  AMMONIA 39*   Coagulation Profile: No results for input(s): INR, PROTIME in the last 168 hours. Cardiac Enzymes: No results for input(s): CKTOTAL, CKMB, CKMBINDEX, TROPONINI in the last 168 hours. BNP (last 3 results) No results for input(s): PROBNP in the last 8760 hours. HbA1C: No results for input(s): HGBA1C in the last 72 hours. CBG: Recent Labs  Lab 03/16/18 1110 03/16/18 1632 03/16/18 2051 03/17/18 0815 03/17/18 1226  GLUCAP 101* 80 103* 87 89   Lipid Profile: No results for input(s): CHOL, HDL, LDLCALC, TRIG, CHOLHDL, LDLDIRECT in the last 72 hours. Thyroid Function Tests: No results for input(s): TSH, T4TOTAL, FREET4, T3FREE, THYROIDAB in the last 72 hours. Anemia Panel: No results for input(s): VITAMINB12, FOLATE, FERRITIN, TIBC, IRON, RETICCTPCT in the last 72 hours. Urine analysis: No results found for: COLORURINE, APPEARANCEUR, [place], [place], [place], [place],  BILIRUBINUR, KETONESUR, PROTEINUR, UROBILINOGEN, NITRITE, LEUKOCYTESUR Sepsis Labs: Invalid input(s): PROCALCITONIN, LACTICIDVEN  Recent Results (from the past 240 hour(s))  Body fluid culture     Status: None (Preliminary result)   Collection Time: 03/17/18 11:53 AM  Result Value Ref Range Status   Specimen Description PARACENTESIS PERITONEAL  Final   Special Requests   Final    NONE Performed at [facility] [address] [place], [place] [address]    Gram Stain   Final    WBC PRESENT, PREDOMINANTLY PMN NO ORGANISMS SEEN CYTOSPIN SMEAR    Culture PENDING  Incomplete   Report Status PENDING  Incomplete     Radiology Studies: No results found. Taye T. [facility] Pager [phone]  If 7PM-7AM, please contact night-coverage [email or web address] Password [facility] 03/17/2018, 3:04 PM

## 2018-03-17 NOTE — NC FL2 (Signed)
[place] MEDICAID FL2 LEVEL OF CARE SCREENING TOOL     IDENTIFICATION  Patient Name: Latasha Guzman Birthdate: Jan 25, 1946 Sex: female Admission Date (Current Location): 03/04/2018  [facility] and [place] Number:  [place] and Address:  [facility],  [place] [address] [place]      Provider Number: [id number]  Attending Physician Name and Address:  Mercy Riding, MD  Relative Name and Phone Number:       Current Level of Care: Hospital Recommended Level of Care: [place] Prior Approval Number:    Date Approved/Denied:   PASRR Number: [id number]  Discharge Plan: SNF    Current Diagnoses: Patient Active Problem List   Diagnosis Date Noted  . Hematochezia   . GI bleed 03/04/2018  . Acute diastolic CHF (congestive heart failure) ([place]) 03/04/2018  . Acute GI bleeding 03/04/2018  . Pressure injury of skin 03/04/2018  . Shortness of breath   . A-fib ([place]) 12/27/2017  . Atrial fibrillation with RVR ([place]) 12/27/2017  . Chronic diastolic CHF (congestive heart failure) ([place]) 12/27/2017  . Lymphedema of both lower extremities 12/27/2017  . Hyperlipidemia associated with type 2 diabetes mellitus ([place]) 12/27/2017  . Acute blood loss anemia 08/02/2017  . Pneumonia of right lower lobe due to infectious organism ([place]) 08/02/2017  . Skin rash 08/02/2017  . Diabetes mellitus ([place]) 07/27/2017  . GERD (gastroesophageal reflux disease) 07/27/2017  . History of GI bleed 07/24/2017  . Chronic atrial fibrillation 10/15/2015  . Essential hypertension 10/15/2015  . Pacemaker 10/15/2015  . Type 2 diabetes mellitus without complication ([place]) 76/16/0737    Orientation RESPIRATION BLADDER Height & Weight     Self, Time, Situation, Place  Normal Indwelling catheter Weight: 293 lb (132.9 kg) Height:  5\' 7"  (170.2 cm)  BEHAVIORAL SYMPTOMS/MOOD NEUROLOGICAL BOWEL NUTRITION STATUS      Continent Diet(heart healthy)  AMBULATORY STATUS  COMMUNICATION OF NEEDS Skin   Extensive Assist Verbally PU Stage and Appropriate Care(stage III sacrum, foam/gauze)                       Personal Care Assistance Level of Assistance  Bathing, Feeding, Dressing Bathing Assistance: Maximum assistance Feeding assistance: Independent Dressing Assistance: Limited assistance     Functional Limitations Info  Sight, Hearing, Speech Sight Info: Adequate Hearing Info: Adequate Speech Info: Adequate    SPECIAL CARE FACTORS FREQUENCY  PT (By licensed PT), OT (By licensed OT)     PT Frequency: 5x OT Frequency: 5x            Contractures Contractures Info: Not present    Additional Factors Info  Code Status, Allergies Code Status Info: full code Allergies Info: Iodine, Iodinated Diagnostic Agents, Penicillins           Current Medications (03/17/2018):  This is the current hospital active medication list Current Facility-Administered Medications  Medication Dose Route Frequency Provider Last Rate Last Dose  . lidocaine (XYLOCAINE) 1 % (with pres) injection           . 0.9 %  sodium chloride infusion (Manually program via Guardrails IV Fluids)   Intravenous Once Marcell Anger, MD   Stopped at 03/04/18 [phone]  . acetaminophen (TYLENOL) tablet 650 mg  650 mg Oral Q6H PRN Marcell Anger, MD   650 mg at 03/15/18 6948   Or  . acetaminophen (TYLENOL) suppository 650 mg  650 mg Rectal Q6H PRN Marcell Anger, MD      .  albuterol (PROVENTIL) (2.5 MG/3ML) 0.083% nebulizer solution 3 mL  3 mL Inhalation Q6H PRN Marcell Anger, MD   3 mL at 03/13/18 1611  . apixaban (ELIQUIS) tablet 5 mg  5 mg Oral BID Arrien, Jimmy Picket, MD   Stopped at 03/17/18 1043  . collagenase (SANTYL) ointment   Topical Daily Arrien, Jimmy Picket, MD      . diltiazem (CARDIZEM) tablet 90 mg  90 mg Oral Q6H Arrien, Jimmy Picket, MD   90 mg at 03/17/18 0558  . furosemide (LASIX) injection 80 mg  80 mg Intravenous TID  Tawni Millers, MD   80 mg at 03/17/18 1003  . guaiFENesin-codeine 100-10 MG/5ML solution 10 mL  10 mL Oral Q4H PRN Arrien, Jimmy Picket, MD      . hydrocortisone (ANUSOL-HC) 2.5 % rectal cream   Rectal BID Gatha Mayer, MD      . insulin aspart (novoLOG) injection 0-5 Units  0-5 Units Subcutaneous QHS Spongberg, Audie Pinto, MD      . insulin aspart (novoLOG) injection 0-9 Units  0-9 Units Subcutaneous TID WC Spongberg, Audie Pinto, MD      . lip balm (CARMEX) ointment   Topical PRN Donne Hazel, MD      . metoprolol tartrate (LOPRESSOR) tablet 25 mg  25 mg Oral BID Tawni Millers, MD   25 mg at 03/17/18 0953  . ondansetron (ZOFRAN) tablet 4 mg  4 mg Oral Q6H PRN Marcell Anger, MD   4 mg at 03/13/18 1627   Or  . ondansetron (ZOFRAN) injection 4 mg  4 mg Intravenous Q6H PRN Marcell Anger, MD   4 mg at 03/10/18 1243  . pantoprazole (PROTONIX) EC tablet 40 mg  40 mg Oral Daily Vena Rua, PA-C   40 mg at 03/17/18 7322  . pneumococcal 23 valent vaccine (PNU-IMMUNE) injection 0.5 mL  0.5 mL Intramuscular Tomorrow-1000 Donne Hazel, MD      . polyethylene glycol [facility] / GLYCOLAX) packet 17 g  17 g Oral Daily Vena Rua, PA-C   17 g at 03/17/18 0254  . potassium chloride 20 MEQ/15ML (10%) solution 40 mEq  40 mEq Oral BID Gonfa, Taye T, MD      . sodium chloride flush (NS) 0.9 % injection 3 mL  3 mL Intravenous Q12H Spongberg, Audie Pinto, MD   3 mL at 03/17/18 1004  . traZODone (DESYREL) tablet 25 mg  25 mg Oral QHS PRN Marcell Anger, MD   25 mg at 03/14/18 2231     Discharge Medications: Please see discharge summary for a list of discharge medications.  Relevant Imaging Results:  Relevant Lab Results:   Additional Information SS# [id number]  Nila Nephew, LCSW

## 2018-03-17 NOTE — Progress Notes (Signed)
Patient was in Ultrasound for paracentesis when dressing change due. Patient refused dressing change at this time, stating she was exhausted and was hurting. Patient stated she would call out when she woke up after nap to let me know she was ready for the dressing to be changed.

## 2018-03-18 ENCOUNTER — Inpatient Hospital Stay (HOSPITAL_COMMUNITY): Payer: 59

## 2018-03-18 DIAGNOSIS — K7469 Other cirrhosis of liver: Secondary | ICD-10-CM

## 2018-03-18 LAB — RENAL FUNCTION PANEL
Albumin: 2.2 g/dL — ABNORMAL LOW (ref 3.5–5.0)
Anion gap: 8 (ref 5–15)
BUN: 44 mg/dL — ABNORMAL HIGH (ref 8–23)
CHLORIDE: 98 mmol/L (ref 98–111)
CO2: 30 mmol/L (ref 22–32)
CREATININE: 1.04 mg/dL — AB (ref 0.44–1.00)
Calcium: 8.9 mg/dL (ref 8.9–10.3)
GFR calc Af Amer: >60 mL/min (ref >60)
GFR calc non Af Amer: 54 mL/min — ABNORMAL LOW (ref >60)
Glucose, Bld: 94 mg/dL (ref 70–99)
Phosphorus: 2.4 mg/dL — ABNORMAL LOW (ref 2.5–4.6)
Potassium: 3.1 mmol/L — ABNORMAL LOW (ref 3.5–5.1)
Sodium: 136 mmol/L (ref 135–145)

## 2018-03-18 LAB — TYPE AND SCREEN
ABO/RH(D): O POS
Antibody Screen: POSITIVE
Donor AG Type: NEGATIVE
Donor AG Type: NEGATIVE
PT AG Type: NEGATIVE
Unit division: 0
Unit division: 0

## 2018-03-18 LAB — BPAM RBC
Blood Product Expiration Date: 202002252359
Blood Product Expiration Date: 202002252359
ISSUE DATE / TIME: 202001231312
Unit Type and Rh: 5100
Unit Type and Rh: 5100

## 2018-03-18 LAB — GLUCOSE, CAPILLARY
Glucose-Capillary: 81 mg/dL (ref 70–99)
Glucose-Capillary: 83 mg/dL (ref 70–99)
Glucose-Capillary: 89 mg/dL (ref 70–99)
Glucose-Capillary: 104 mg/dL — ABNORMAL HIGH (ref 70–99)

## 2018-03-18 LAB — MAGNESIUM: Magnesium: 2 mg/dL (ref 1.7–2.4)

## 2018-03-18 MED ORDER — TORSEMIDE 20 MG PO TABS
80.0000 mg | ORAL_TABLET | Freq: Two times a day (BID) | ORAL | Status: DC
  Filled 2018-03-18: qty 4

## 2018-03-18 MED ORDER — POTASSIUM CHLORIDE 20 MEQ/15ML (10%) PO SOLN
40.0000 meq | ORAL | Status: AC
  Administered 2018-03-18: 40 meq via ORAL
  Filled 2018-03-18: qty 30

## 2018-03-18 MED ORDER — TORSEMIDE 20 MG PO TABS
80.0000 mg | ORAL_TABLET | Freq: Every day | ORAL | Status: DC
  Administered 2018-03-18 – 2018-03-19 (×2): 80 mg via ORAL
  Filled 2018-03-18 (×3): qty 4

## 2018-03-18 MED ORDER — LACTULOSE 10 GM/15ML PO SOLN
30.0000 g | Freq: Every day | ORAL | Status: DC
  Administered 2018-03-18 – 2018-03-24 (×4): 30 g via ORAL
  Filled 2018-03-18 (×8): qty 60

## 2018-03-18 MED ORDER — SPIRONOLACTONE 25 MG PO TABS
50.0000 mg | ORAL_TABLET | Freq: Every day | ORAL | Status: DC
  Administered 2018-03-18 – 2018-03-19 (×2): 50 mg via ORAL
  Filled 2018-03-18 (×2): qty 2

## 2018-03-18 MED ORDER — POTASSIUM CHLORIDE 20 MEQ/15ML (10%) PO SOLN
40.0000 meq | ORAL | Status: DC
  Administered 2018-03-18: 40 meq via ORAL
  Filled 2018-03-18: qty 30

## 2018-03-18 NOTE — Progress Notes (Addendum)
Foley catheter removed at 12pm.  Patient voided 75cc of urine at 1400.  Placed on bedpan at 1800 and unable to void.  Bladder scanner showing 650cc's in bladder.  Per MD order another foley catheter was placed for retention. 600 cc's of yellow urine removed.

## 2018-03-18 NOTE — Progress Notes (Signed)
PROGRESS NOTE  Latasha Guzman GLO:[id number] DOB: 06-Aug-1945 DOA: 03/04/2018 PCP: Nicoletta Dress, MD   LOS: 14 days    Brief Narrative / Interim history: 73 year old female with history of chronic A. Fib with pacemaker, DM 2, hypertension, diastolic CHF and lymphedema transferred from [facility] due to symptomatic anemia in the setting of GI bleed.  Hemoglobin 6.3 on arrival here.  Chest x-ray with bilateral interstitial infiltrate with bilateral pleural effusion and vascular congestion.  EKG with atrial fibrillation with RVR to 123 and PVCs.  She was transfused 2 units of blood with appropriate response.  IMA globin remained stable between 8 and 9. Per previous provider, GI consulted and recommended observation as hemoglobin remained stable.  Apixaban resumed.  Hemoglobin slowly trended down to 7.8.  Transfused 1 unit of blood appropriate response.  Patient was aggressively diuresed with IV lasix 80 mg 3 times daily for fluid overload and pulmonary vascular congestion. On 1/23, abdominal pain concerning for ascites.  No history of cirrhosis, hepatitis or EtOH use per patient or chart review.  Ammonia on the upper side of normal at 39.  Limited abdominal ultrasound showed moderate ascites in all quadrants.  Had paracentesis with removal of 2 L on 1/24.  Abdominal exam, fluid cytology and culture not consistent with SBP. SAAG greater than 1.1 suggestive for some portal hypertension.  RUQ ultrasound obtained and confirmed liver cirrhosis with patent portal vein and normal blood flow.   On 1/25 patient was transitioned to torsemide 80 mg daily.  Also started on Aldactone 50 mg daily and lactulose  Subjective: No major events overnight of this morning.  Denies chest pain, dyspnea, abdominal pain, nausea or vomiting.  Last bowel movement 2 days ago.  Assessment & Plan: Principal Problem:   GI bleed Active Problems:   Acute blood loss anemia   Chronic atrial fibrillation   Diabetes mellitus  (HCC)   Essential hypertension   GERD (gastroesophageal reflux disease)   Acute diastolic CHF (congestive heart failure) (HCC)   Acute GI bleeding   Pressure injury of skin   Shortness of breath   Hematochezia  Acute symptomatic anemia likely due to upper GI bleed: Received 2 units on admission and 1 unit on 1/23 with appropriate response. -Continue monitoring  Acute on chronic diastolic CHF exacerbation/pulmonary hypertension: Echo with EF of 55 to 60%, no wall motion abnormalities but some diastolic flattening, severe LAE and PA PP of 64.  Chest x-ray with pulmonary congestion and pleural effusion on admission.  Still with significant lymphedema and dependent edema which is likely a combination of CHF, cirrhosis and hypoalbuminemia.  Fair response to IV Lasix 80 mg 3 times daily -Transition to torsemide 80 mg daily -Aldactone and lactulose as below -Daily weight, intake output and renal function -Replenish electrolytes as appropriate  Liver cirrhosis/ascites:  noted on abdominal ultrasound although patient denies history of liver disease, hepatitis or EtOH.  No signs of SBP. Albumin low.  Ammonia 39.  Paracentesis yielded 2 L on 1/24.  Cytology and culture not consistent with SBP. -Torsemide 80 mg daily -Aldactone 50 mg daily -Start lactulose at 30 g daily and titrate as needed  Dependent edema/lymphedema: As above -Manage as above  Hypokalemia/hypomagnesemia: Likely due to diuretics.  Hypomagnesemia resolved -Monitor and replenish  -Started on Aldactone which could help with hypokalemia.  Atrial fibrillation with RVR: RVR resolved. -Continue beta-blocker and CCB -Hold Eliquis this morning paracentesis  CKD 3: Stable -Monitor closely while on high-dose diuretics  DM2: -CBG monitoring -SSI  Bladder outlet obstruction: urinary obstruction noted on bladder scan. -We will discontinue Foley and start voiding trial -If she fails, we may have to replace and discharged with  Foley.  Left hip pain: Resolved  Scheduled Meds: . sodium chloride   Intravenous Once  . apixaban  5 mg Oral BID  . collagenase   Topical Daily  . diltiazem  90 mg Oral Q6H  . hydrocortisone   Rectal BID  . insulin aspart  0-5 Units Subcutaneous QHS  . insulin aspart  0-9 Units Subcutaneous TID WC  . lactulose  30 g Oral Daily  . metoprolol tartrate  25 mg Oral BID  . pantoprazole  40 mg Oral Daily  . pneumococcal 23 valent vaccine  0.5 mL Intramuscular Tomorrow-1000  . potassium chloride  40 mEq Oral Q4H  . sodium chloride flush  3 mL Intravenous Q12H  . spironolactone  50 mg Oral Daily  . torsemide  80 mg Oral Daily   Continuous Infusions: PRN Meds:.acetaminophen **OR** acetaminophen, albuterol, guaiFENesin-codeine, lip balm, ondansetron **OR** ondansetron (ZOFRAN) IV, traZODone  DVT prophylaxis: On Eliquis for A. fib Code Status: Full code Family Communication: No family member at bedside Disposition Plan: Transition to oral torsemide.  Final disposition SNF probably in the next 24 to 48 hours  Consultants:   None  Procedures:   None  Antimicrobials:  None  Objective: Vitals:   03/17/18 1301 03/17/18 2038 03/18/18 0448 03/18/18 1247  BP: 103/60 127/73 118/68 117/69  Pulse: 86 99 94 (!) 103  Resp: 16 18 18 18   Temp: 98.2 F (36.8 C) 98.4 F (36.9 C) 98.6 F (37 C) 98.5 F (36.9 C)  TempSrc: Oral Oral Oral Oral  SpO2: 97% 97% 92% 96%  Weight:      Height:        Intake/Output Summary (Last 24 hours) at 03/18/2018 1322 Last data filed at 03/18/2018 1100 Gross per 24 hour  Intake 120 ml  Output 1650 ml  Net -1530 ml   Filed Weights   03/15/18 0441 03/16/18 0611 03/17/18 0542  Weight: 135.2 kg 126.1 kg 132.9 kg    Examination: GENERAL: Appears well. No acute distress.  HEENT: MMM.  Vision and Hearing grossly intact.  NECK: Supple.  No JVD.  LUNGS:  No IWOB. Good air movement. CTAB.  HEART:  RRR. Heart sounds normal.  ABD: Bowel sounds present.  Soft.  Dullness to percussion laterally EXT: Lymphedema with venous stasis SKIN: no apparent skin lesion.  NEURO: Awake, alert and oriented appropriately.  No gross deficit.  PSYCH: Calm. Normal affect. Data Reviewed: I have independently reviewed following labs and imaging studies   CBC: Recent Labs  Lab 03/14/18 0441 03/15/18 0840 03/16/18 0513 03/17/18 0408  WBC 13.2* 15.9* 12.9* 13.2*  HGB 8.0* 8.1* 7.8* 9.0*  HCT 25.4* 26.5* 26.1* 29.4*  MCV 88.8 94.6 94.6 93.0  PLT 301 339 303 341   Basic Metabolic Panel: Recent Labs  Lab 03/14/18 0441 03/15/18 0409 03/15/18 0840 03/16/18 0513 03/17/18 0408 03/18/18 0446  NA 134* 136  --  136 136 136  K 3.8 3.1*  --  3.8 3.3* 3.1*  CL 100 99  --  101 98 98  CO2 26 28  --  27 29 30   GLUCOSE 92 97  --  92 91 94  BUN 42* 41*  --  44* 45* 44*  CREATININE 0.95 1.00  --  1.04* 0.96 1.04*  CALCIUM 8.5* 8.7*  --  8.7* 9.0 8.9  MG  --   --  1.8 1.8 1.9 2.0  PHOS  --   --   --   --   --  2.4*   GFR: Estimated Creatinine Clearance: 70.6 mL/min (A) (by C-G formula based on SCr of 1.04 mg/dL (H)). Liver Function Tests: Recent Labs  Lab 03/16/18 0513 03/18/18 0446  ALBUMIN 2.0* 2.2*   No results for input(s): LIPASE, AMYLASE in the last 168 hours. Recent Labs  Lab 03/17/18 1208  AMMONIA 39*   Coagulation Profile: No results for input(s): INR, PROTIME in the last 168 hours. Cardiac Enzymes: No results for input(s): CKTOTAL, CKMB, CKMBINDEX, TROPONINI in the last 168 hours. BNP (last 3 results) No results for input(s): PROBNP in the last 8760 hours. HbA1C: No results for input(s): HGBA1C in the last 72 hours. CBG: Recent Labs  Lab 03/17/18 1226 03/17/18 1640 03/17/18 2040 03/18/18 0747 03/18/18 1142  GLUCAP 89 84 109* 81 83   Lipid Profile: No results for input(s): CHOL, HDL, LDLCALC, TRIG, CHOLHDL, LDLDIRECT in the last 72 hours. Thyroid Function Tests: No results for input(s): TSH, T4TOTAL, FREET4, T3FREE, THYROIDAB  in the last 72 hours. Anemia Panel: No results for input(s): VITAMINB12, FOLATE, FERRITIN, TIBC, IRON, RETICCTPCT in the last 72 hours. Urine analysis: No results found for: COLORURINE, APPEARANCEUR, LABSPEC, PHURINE, GLUCOSEU, HGBUR, BILIRUBINUR, KETONESUR, PROTEINUR, UROBILINOGEN, NITRITE, LEUKOCYTESUR Sepsis Labs: Invalid input(s): PROCALCITONIN, LACTICIDVEN  Recent Results (from the past 240 hour(s))  Body fluid culture     Status: None (Preliminary result)   Collection Time: 03/17/18 11:53 AM  Result Value Ref Range Status   Specimen Description   Final    PARACENTESIS PERITONEAL Performed at [facility] [address] [place], [place] [address]    Special Requests   Final    NONE Performed at [facility], [place] [address], [place], [place] [address]    Gram Stain   Final    WBC PRESENT, PREDOMINANTLY PMN NO ORGANISMS SEEN CYTOSPIN SMEAR    Culture   Final    NO GROWTH < 24 HOURS Performed at [facility], [place] [address], [place],  [address]    Report Status PENDING  Incomplete     Radiology Studies: US Abdomen Limited Ruq  Result Date: 03/18/2018 CLINICAL DATA:  Cirrhosis. EXAM: ULTRASOUND ABDOMEN LIMITED RIGHT UPPER QUADRANT COMPARISON:  None. FINDINGS: Gallbladder: Gallbladder is nearly completely filled with sludge. No gallstones seen. Gallbladder wall is mildly thickened circumferentially. Common bile duct: Diameter: 5.5 mm. Liver: Liver echotexture is diffusely coarsened and the peripheral contours are diffusely nodular, compatible with the given history of cirrhosis. No focal mass or lesion is appreciated within the liver. Portal vein is patent on color Doppler imaging with normal direction of blood flow towards the liver. Ascites noted in the RIGHT upper quadrant. IMPRESSION: 1. Cirrhotic liver. 2. Gallbladder is nearly completely filled with sludge. Gallbladder walls are mildly thickened, likely secondary to the  sludge and/or adjacent liver disease. 3. Ascites. Electronically Signed   By: Franki Cabot M.D.   On: 03/18/2018 09:33   Ancil Dewan T. [facility] Pager [phone]  If 7PM-7AM, please contact night-coverage [email or web address] Password [email or web address] 03/18/2018, 1:22 PM

## 2018-03-19 DIAGNOSIS — R338 Other retention of urine: Secondary | ICD-10-CM

## 2018-03-19 LAB — GLUCOSE, CAPILLARY
Glucose-Capillary: 92 mg/dL (ref 70–99)
Glucose-Capillary: 88 mg/dL (ref 70–99)
Glucose-Capillary: 89 mg/dL (ref 70–99)
Glucose-Capillary: 87 mg/dL (ref 70–99)

## 2018-03-19 LAB — COMPREHENSIVE METABOLIC PANEL
ALT: 6 U/L (ref 0–44)
AST: 17 U/L (ref 15–41)
Albumin: 2 g/dL — ABNORMAL LOW (ref 3.5–5.0)
Alkaline Phosphatase: 53 U/L (ref 38–126)
Anion gap: 7 (ref 5–15)
BUN: 38 mg/dL — ABNORMAL HIGH (ref 8–23)
CHLORIDE: 101 mmol/L (ref 98–111)
CO2: 29 mmol/L (ref 22–32)
Calcium: 8.8 mg/dL — ABNORMAL LOW (ref 8.9–10.3)
Creatinine, Ser: 0.91 mg/dL (ref 0.44–1.00)
GFR calc Af Amer: >60 mL/min (ref >60)
GFR calc non Af Amer: >60 mL/min (ref >60)
Glucose, Bld: 92 mg/dL (ref 70–99)
Potassium: 3.6 mmol/L (ref 3.5–5.1)
Sodium: 137 mmol/L (ref 135–145)
Total Bilirubin: 2.1 mg/dL — ABNORMAL HIGH (ref 0.3–1.2)
Total Protein: 5.3 g/dL — ABNORMAL LOW (ref 6.5–8.1)

## 2018-03-19 LAB — MAGNESIUM: MAGNESIUM: 2 mg/dL (ref 1.7–2.4)

## 2018-03-19 LAB — HEMOGLOBIN AND HEMATOCRIT, BLOOD
HCT: 27.2 % — ABNORMAL LOW (ref 36.0–46.0)
Hemoglobin: 8.3 g/dL — ABNORMAL LOW (ref 12.0–15.0)

## 2018-03-19 MED ORDER — BETHANECHOL CHLORIDE 10 MG PO TABS
10.0000 mg | ORAL_TABLET | Freq: Three times a day (TID) | ORAL | Status: DC
  Administered 2018-03-19 – 2018-03-27 (×26): 10 mg via ORAL
  Filled 2018-03-19 (×28): qty 1

## 2018-03-19 NOTE — Progress Notes (Signed)
PROGRESS NOTE  Latasha Guzman UUV:[id number] DOB: [birth year] DOA: 03/04/2018 PCP: Nicoletta Dress, MD   LOS: 15 days    Brief Narrative / Interim history: 73 year old female with history of chronic A. Fib with pacemaker, DM 2, hypertension, diastolic CHF and lymphedema transferred from [facility] due to symptomatic anemia in the setting of GI bleed.  Hemoglobin 6.3 on arrival here.  Chest x-ray with bilateral interstitial infiltrate with bilateral pleural effusion and vascular congestion.  EKG with atrial fibrillation with RVR to 123 and PVCs.  She was transfused 2 units of blood with appropriate response.  IMA globin remained stable between 8 and 9. Per previous provider, GI consulted and recommended observation as hemoglobin remained stable.  Apixaban resumed.  Hemoglobin slowly trended down to 7.8.  Transfused 1 unit of blood appropriate response.  Patient was aggressively diuresed with IV lasix 80 mg 3 times daily for fluid overload and pulmonary vascular congestion. On 1/23, abdominal pain concerning for ascites.  No history of cirrhosis, hepatitis or EtOH use per patient or chart review.  Ammonia on the upper side of normal at 39.  Limited abdominal ultrasound showed moderate ascites in all quadrants.  Had paracentesis with removal of 2 L on 1/24.  Abdominal exam, fluid cytology and culture not consistent with SBP. SAAG greater than 1.1 suggestive for some portal hypertension.  RUQ ultrasound obtained and confirmed liver cirrhosis with patent portal vein and normal blood flow.   On 1/25 patient was transitioned to torsemide 80 mg daily.  Also started on Aldactone 50 mg daily and lactulose  Subjective: No major events overnight or this morning.  No complaints.  Denies chest pain, dyspnea, abdominal pain, nausea, vomiting.  She was not able to void on her own after we discontinued Foley catheter yesterday.  Foley catheter was replaced due to bladder scan showing about 650 cc.  About 600 cc urine  removed after Foley was replaced.  Reports having about 2 bowel movements recently.  Assessment & Plan: Principal Problem:   GI bleed Active Problems:   Acute blood loss anemia   Chronic atrial fibrillation   Diabetes mellitus (HCC)   Essential hypertension   GERD (gastroesophageal reflux disease)   Acute diastolic CHF (congestive heart failure) (HCC)   Acute GI bleeding   Pressure injury of skin   Shortness of breath   Hematochezia  Acute symptomatic anemia likely due to upper GI bleed: Received 2 units on admission and 1 unit on 1/23 with appropriate response.  Hemoglobin stable. -Continue monitoring.  Acute on chronic diastolic CHF exacerbation/pulmonary hypertension: Echo with EF of 55 to 60%, no wall motion abnormalities but some diastolic flattening, severe LAE and PA PP of 64.  Chest x-ray with pulmonary congestion and pleural effusion on admission.  Still with significant lymphedema and dependent edema which is likely a combination of CHF, cirrhosis and hypoalbuminemia.  Fair response to IV Lasix 80 mg 3 times daily.  Transitioned to oral torsemide 80 mg daily with Aldactone 50 mg daily on 1/25 with good urine output. -Continue torsemide 80 mg daily and Aldactone 50 mg daily -Continue lactulose and adjust dose for 2-3 bowel movement a day -Daily weight, intake output and renal function -Replenish electrolytes as appropriate  Liver cirrhosis/ascites:  noted on abdominal ultrasound although patient denies history of liver disease, hepatitis or EtOH.  No signs of SBP. Albumin low.  Ammonia 39.  Paracentesis yielded 2 L on 1/24.  Cytology and culture not consistent with SBP. -Torsemide 80 mg daily -  Aldactone 50 mg daily -Start lactulose at 30 g daily and titrate as needed  Dependent edema/lymphedema: Improved -Manage as above  Hypokalemia/hypomagnesemia: Resolved -Monitor and replenish  -Started on Aldactone which could help with hypokalemia.  Atrial fibrillation with RVR:  RVR resolved. -Continue beta-blocker and CCB -Hold Eliquis this morning paracentesis  CKD 3: Stable -Monitor closely while on high-dose diuretics  DM2: -CBG monitoring -SSI  Bladder outlet obstruction: urinary obstruction noted on bladder scan.  Failed voiding trial yesterday and Foley was reinserted. -Start Urecholine as 10 mg 3 times daily and attempt voiding trial again today. -If she fails, we may have to replace and discharged with Foley.  Left hip pain: Resolved  Scheduled Meds: . sodium chloride   Intravenous Once  . apixaban  5 mg Oral BID  . bethanechol  10 mg Oral TID  . collagenase   Topical Daily  . diltiazem  90 mg Oral Q6H  . hydrocortisone   Rectal BID  . insulin aspart  0-5 Units Subcutaneous QHS  . insulin aspart  0-9 Units Subcutaneous TID WC  . lactulose  30 g Oral Daily  . metoprolol tartrate  25 mg Oral BID  . pantoprazole  40 mg Oral Daily  . pneumococcal 23 valent vaccine  0.5 mL Intramuscular Tomorrow-1000  . sodium chloride flush  3 mL Intravenous Q12H  . spironolactone  50 mg Oral Daily  . torsemide  80 mg Oral Daily   Continuous Infusions: PRN Meds:.acetaminophen **OR** acetaminophen, albuterol, guaiFENesin-codeine, lip balm, ondansetron **OR** ondansetron (ZOFRAN) IV, traZODone  DVT prophylaxis: On Eliquis for A. fib Code Status: Full code Family Communication: No family member at bedside Disposition Plan: Medically ready for discharge pending SNF availability.  Consultants:   None  Procedures:   None  Antimicrobials:  None  Objective: Vitals:   03/18/18 0448 03/18/18 1247 03/18/18 2244 03/19/18 0503  BP: 118/68 117/69  95/63  Pulse: 94 80 89 81  Resp: 18 18 20 18   Temp: 98.6 F (37 C) 98.5 F (36.9 C) 98 F (36.7 C) 98.2 F (36.8 C)  TempSrc: Oral Oral Oral Oral  SpO2: 92% 96% 98% 94%  Weight:      Height:        Intake/Output Summary (Last 24 hours) at 03/19/2018 1409 Last data filed at 03/19/2018 1123 Gross per 24  hour  Intake 1103 ml  Output 1575 ml  Net -472 ml   Filed Weights   03/15/18 0441 03/16/18 0611 03/17/18 0542  Weight: 135.2 kg 126.1 kg 132.9 kg    Examination: GENERAL: Appears well. No acute distress.  HEENT: MMM.  Vision and Hearing grossly intact.  NECK: Supple.  No JVD.  LUNGS:  No IWOB. Good air movement. CTAB.  HEART:  RRR. Heart sounds normal.  ABD: Bowel sounds present. Soft. Non tender.  Dullness to percussion laterally EXT: Lymphedema with venous stasis SKIN: no apparent skin lesion.  NEURO: Awake, alert and oriented to self, place, person, month.  No gross deficit.  PSYCH: Calm. Normal affect.  Data Reviewed: I have independently reviewed following labs and imaging studies   CBC: Recent Labs  Lab 03/14/18 0441 03/15/18 0840 03/16/18 0513 03/17/18 0408 03/19/18 0429  WBC 13.2* 15.9* 12.9* 13.2*  --   HGB 8.0* 8.1* 7.8* 9.0* 8.3*  HCT 25.4* 26.5* 26.1* 29.4* 27.2*  MCV 88.8 94.6 94.6 93.0  --   PLT 301 339 303 319  --    Basic Metabolic Panel: Recent Labs  Lab 03/15/18 0409 03/15/18  2563 03/16/18 0513 03/17/18 0408 03/18/18 0446 03/19/18 0429  NA 136  --  136 136 136 137  K 3.1*  --  3.8 3.3* 3.1* 3.6  CL 99  --  101 98 98 101  CO2 28  --  27 29 30 29   GLUCOSE 97  --  92 91 94 92  BUN 41*  --  44* 45* 44* 38*  CREATININE 1.00  --  1.04* 0.96 1.04* 0.91  CALCIUM 8.7*  --  8.7* 9.0 8.9 8.8*  MG  --  1.8 1.8 1.9 2.0 2.0  PHOS  --   --   --   --  2.4*  --    GFR: Estimated Creatinine Clearance: 80.7 mL/min (by C-G formula based on SCr of 0.91 mg/dL). Liver Function Tests: Recent Labs  Lab 03/16/18 0513 03/18/18 0446 03/19/18 0429  AST  --   --  17  ALT  --   --  6  ALKPHOS  --   --  53  BILITOT  --   --  2.1*  PROT  --   --  5.3*  ALBUMIN 2.0* 2.2* 2.0*   No results for input(s): LIPASE, AMYLASE in the last 168 hours. Recent Labs  Lab 03/17/18 1208  AMMONIA 39*   Coagulation Profile: No results for input(s): INR, PROTIME in the  last 168 hours. Cardiac Enzymes: No results for input(s): CKTOTAL, CKMB, CKMBINDEX, TROPONINI in the last 168 hours. BNP (last 3 results) No results for input(s): PROBNP in the last 8760 hours. HbA1C: No results for input(s): HGBA1C in the last 72 hours. CBG: Recent Labs  Lab 03/18/18 1142 03/18/18 1641 03/18/18 2243 03/19/18 0753 03/19/18 1250  GLUCAP 83 89 104* 92 88   Lipid Profile: No results for input(s): CHOL, HDL, LDLCALC, TRIG, CHOLHDL, LDLDIRECT in the last 72 hours. Thyroid Function Tests: No results for input(s): TSH, T4TOTAL, FREET4, T3FREE, THYROIDAB in the last 72 hours. Anemia Panel: No results for input(s): VITAMINB12, FOLATE, FERRITIN, TIBC, IRON, RETICCTPCT in the last 72 hours. Urine analysis: No results found for: COLORURINE, APPEARANCEUR, LABSPEC, PHURINE, GLUCOSEU, HGBUR, BILIRUBINUR, KETONESUR, PROTEINUR, UROBILINOGEN, NITRITE, LEUKOCYTESUR Sepsis Labs: Invalid input(s): PROCALCITONIN, LACTICIDVEN  Recent Results (from the past 240 hour(s))  Body fluid culture     Status: None (Preliminary result)   Collection Time: 03/17/18 11:53 AM  Result Value Ref Range Status   Specimen Description   Final    PARACENTESIS PERITONEAL Performed at [facility] [address], [place], [place] [address]    Special Requests   Final    NONE Performed at [facility], [place] [address] [place], [place] [address]    Gram Stain   Final    WBC PRESENT, PREDOMINANTLY PMN NO ORGANISMS SEEN CYTOSPIN SMEAR    Culture   Final    NO GROWTH 2 DAYS Performed at [facility], [place] [address], [place], [place] [address]    Report Status PENDING  Incomplete     Radiology Studies: No results found. Zae Kirtz T. [facility] Pager [phone]  If 7PM-7AM, please contact night-coverage [email or web address] Password [email or web address] 03/19/2018, 2:09 PM

## 2018-03-20 LAB — BODY FLUID CULTURE: Culture: NO GROWTH

## 2018-03-20 LAB — BASIC METABOLIC PANEL
ANION GAP: 8 (ref 5–15)
BUN: 37 mg/dL — ABNORMAL HIGH (ref 8–23)
CO2: 29 mmol/L (ref 22–32)
Calcium: 8.9 mg/dL (ref 8.9–10.3)
Chloride: 97 mmol/L — ABNORMAL LOW (ref 98–111)
Creatinine, Ser: 1.04 mg/dL — ABNORMAL HIGH (ref 0.44–1.00)
GFR calc Af Amer: >60 mL/min (ref >60)
GFR calc non Af Amer: 54 mL/min — ABNORMAL LOW (ref >60)
Glucose, Bld: 91 mg/dL (ref 70–99)
POTASSIUM: 3.6 mmol/L (ref 3.5–5.1)
Sodium: 134 mmol/L — ABNORMAL LOW (ref 135–145)

## 2018-03-20 LAB — MAGNESIUM: Magnesium: 1.9 mg/dL (ref 1.7–2.4)

## 2018-03-20 LAB — AMMONIA: Ammonia: 29 umol/L (ref 9–35)

## 2018-03-20 LAB — GLUCOSE, CAPILLARY
Glucose-Capillary: 91 mg/dL (ref 70–99)
Glucose-Capillary: 86 mg/dL (ref 70–99)
Glucose-Capillary: 81 mg/dL (ref 70–99)

## 2018-03-20 LAB — HEMOGLOBIN AND HEMATOCRIT, BLOOD
HCT: 29.2 % — ABNORMAL LOW (ref 36.0–46.0)
Hemoglobin: 8.9 g/dL — ABNORMAL LOW (ref 12.0–15.0)

## 2018-03-20 MED ORDER — TORSEMIDE 20 MG PO TABS
40.0000 mg | ORAL_TABLET | Freq: Every day | ORAL | Status: DC
  Administered 2018-03-20 – 2018-03-22 (×3): 40 mg via ORAL
  Filled 2018-03-20 (×4): qty 2

## 2018-03-20 MED ORDER — SPIRONOLACTONE 25 MG PO TABS
100.0000 mg | ORAL_TABLET | Freq: Every day | ORAL | Status: DC
  Administered 2018-03-20 – 2018-03-22 (×3): 100 mg via ORAL
  Filled 2018-03-20 (×3): qty 4

## 2018-03-20 NOTE — Progress Notes (Signed)
Pt refused lactulose this AM. Pt states she wants to speak with MD this morning. Educated pt that MD would see pt during the day. Pt verbalizes understanding.

## 2018-03-20 NOTE — Progress Notes (Signed)
CSW following for SNF needs to [employer]. [facility] insurance authorization started this am.   Kathrin Greathouse, Marlinda Mike, MSW Clinical Social Worker  [phone] 03/20/2018  2:59 PM

## 2018-03-20 NOTE — Care Management Important Message (Signed)
Important Message  Patient Details  Name: Latasha Guzman MRN: [id number] Date of Birth: June 13, 1945   Medicare Important Message Given:  Yes    Kerin Salen 03/20/2018, 11:23 AMImportant Message  Patient Details  Name: Latasha Guzman MRN: [id number] Date of Birth: January 17, 1946   Medicare Important Message Given:  Yes    Kerin Salen 03/20/2018, 11:23 AM

## 2018-03-20 NOTE — Progress Notes (Signed)
PROGRESS NOTE  Latasha Guzman ZOX:[id number] DOB: Jan 03, 1946 DOA: 03/04/2018 PCP: Nicoletta Dress, MD   LOS: 16 days    Brief Narrative / Interim history: 73 year old female with history of chronic A. Fib with pacemaker, DM 2, hypertension, diastolic CHF and lymphedema transferred from [facility] due to symptomatic anemia in the setting of GI bleed.  Hemoglobin 6.3 on arrival here.  Chest x-ray with bilateral interstitial infiltrate with bilateral pleural effusion and vascular congestion.  EKG with atrial fibrillation with RVR to 123 and PVCs.  She was transfused 2 units of blood with appropriate response.  IMA globin remained stable between 8 and 9. Per previous provider, GI consulted and recommended observation as hemoglobin remained stable.  Apixaban resumed.  Hemoglobin slowly trended down to 7.8.  Transfused 1 unit of blood appropriate response.  Patient was aggressively diuresed with IV lasix 80 mg 3 times daily for fluid overload and pulmonary vascular congestion. On 1/23, abdominal pain concerning for ascites.  No history of cirrhosis, hepatitis or EtOH use per patient or chart review.  Ammonia on the upper side of normal at 39.  Limited abdominal ultrasound showed moderate ascites in all quadrants.  Had paracentesis with removal of 2 L on 1/24.  Abdominal exam, fluid cytology and culture not consistent with SBP. SAAG greater than 1.1 suggestive for some portal hypertension.  RUQ ultrasound obtained and confirmed liver cirrhosis with patent portal vein and normal blood flow.   On 1/25 patient was transitioned to torsemide 80 mg daily.  Also started on Aldactone 50 mg daily and lactulose.  On 1/27, torsemide reduced further to 40 mg daily.  Aldactone increased to 100 mg daily  Subjective: No major events overnight of this morning.  Refused to take lactulose this morning because she had about 5 bowel movements yesterday.  Denies chest pain, dyspnea, abdominal pain, nausea or  vomiting.  Assessment & Plan: Principal Problem:   GI bleed Active Problems:   Acute blood loss anemia   Chronic atrial fibrillation   Diabetes mellitus (HCC)   Essential hypertension   GERD (gastroesophageal reflux disease)   Acute diastolic CHF (congestive heart failure) (HCC)   Acute GI bleeding   Pressure injury of skin   Shortness of breath   Hematochezia  Acute symptomatic anemia likely due to upper GI bleed: Received 2 units on admission and 1 unit on 1/23 with appropriate response.  Hemoglobin stable. -We will continue monitoring.  Acute on chronic diastolic CHF exacerbation/pulmonary hypertension: Echo with EF of 55 to 60%, no wall motion abnormalities but some diastolic flattening, severe LAE and PA PP of 64.  Chest x-ray with pulmonary congestion and pleural effusion on admission.  Still with significant lymphedema and dependent edema which is likely a combination of CHF, cirrhosis and hypoalbuminemia.  Fair response to IV Lasix 80 mg 3 times daily.  Transitioned to oral torsemide 80 mg daily with Aldactone 50 mg daily on 1/25.  Continues to have good urine output. -Reduce torsemide to 40 mg and increased Aldactone to 100 mg on 1/27.  Slight uptrend in her serum creatinine.  -Continue lactulose and adjust dose for 2-3 bowel movement a day -Daily weight, intake output and renal function -Replenish electrolytes as appropriate  Liver cirrhosis/ascites:  noted on abdominal ultrasound although patient denies history of liver disease, hepatitis or EtOH.  No signs of SBP. Albumin low.  Ammonia 39.  Paracentesis yielded 2 L on 1/24.  Cytology and culture not consistent with SBP. -Diuretics and lactulose as above.  Dependent edema/lymphedema: Improved -Manage as above  Hypokalemia/hypomagnesemia: Resolved -Monitor and replenish   Atrial fibrillation with RVR: RVR resolved. -Continue beta-blocker, CCB and Eliquis.  CKD 3: Slight uptrend in his serum creatinine likely due to  increased GI output in the setting of diuresis. -Monitor closely while on high-dose diuretics  DM2: -CBG monitoring -SSI  Bladder outlet obstruction: urinary obstruction noted on bladder scan.  Failed voiding trial 1/26.  Passed voiding trial with Urecholine.  Left hip pain: Resolved  Scheduled Meds: . sodium chloride   Intravenous Once  . apixaban  5 mg Oral BID  . bethanechol  10 mg Oral TID  . collagenase   Topical Daily  . diltiazem  90 mg Oral Q6H  . hydrocortisone   Rectal BID  . insulin aspart  0-5 Units Subcutaneous QHS  . insulin aspart  0-9 Units Subcutaneous TID WC  . lactulose  30 g Oral Daily  . metoprolol tartrate  25 mg Oral BID  . pantoprazole  40 mg Oral Daily  . pneumococcal 23 valent vaccine  0.5 mL Intramuscular Tomorrow-1000  . sodium chloride flush  3 mL Intravenous Q12H  . spironolactone  100 mg Oral Daily  . torsemide  40 mg Oral Daily   Continuous Infusions: PRN Meds:.acetaminophen **OR** acetaminophen, albuterol, guaiFENesin-codeine, lip balm, ondansetron **OR** ondansetron (ZOFRAN) IV, traZODone  DVT prophylaxis: On Eliquis for A. fib Code Status: Full code Family Communication: No family member at bedside Disposition Plan: Medically ready for discharge pending SNF availability.  Consultants:   None  Procedures:   None  Antimicrobials:  None  Objective: Vitals:   03/20/18 0600 03/20/18 0759 03/20/18 1007 03/20/18 1012  BP: 98/64 (!) 104/53  (!) 99/53  Pulse: 98 88 98 97  Resp:  14  16  Temp:  97.6 F (36.4 C)  97.8 F (36.6 C)  TempSrc:  Oral  Oral  SpO2:  97%  97%  Weight:      Height:        Intake/Output Summary (Last 24 hours) at 03/20/2018 1244 Last data filed at 03/20/2018 1030 Gross per 24 hour  Intake 180 ml  Output 800 ml  Net -620 ml   Filed Weights   03/15/18 0441 03/16/18 0611 03/17/18 0542  Weight: 135.2 kg 126.1 kg 132.9 kg    Examination:  GENERAL: Appears well. No acute distress.  HEENT: MMM.   Vision and Hearing grossly intact.  NECK: Supple.  No JVD.  LUNGS:  No IWOB. Good air movement. CTAB.  HEART:  RRR. Heart sounds normal.  Dullness to percussion laterally. ABD: Bowel sounds present. Soft. Non tender.  EXT: Venous stasis with mild lymphedema bilaterally. SKIN: no apparent skin lesion.  NEURO: Awake, alert and oriented appropriately.  No gross deficit.  PSYCH: Calm. Normal affect.  Data Reviewed: I have independently reviewed following labs and imaging studies   CBC: Recent Labs  Lab 03/14/18 0441 03/15/18 0840 03/16/18 0513 03/17/18 0408 03/19/18 0429 03/20/18 0432  WBC 13.2* 15.9* 12.9* 13.2*  --   --   HGB 8.0* 8.1* 7.8* 9.0* 8.3* 8.9*  HCT 25.4* 26.5* 26.1* 29.4* 27.2* 29.2*  MCV 88.8 94.6 94.6 93.0  --   --   PLT 301 339 303 319  --   --    Basic Metabolic Panel: Recent Labs  Lab 03/16/18 0513 03/17/18 0408 03/18/18 0446 03/19/18 0429 03/20/18 0432  NA 136 136 136 137 134*  K 3.8 3.3* 3.1* 3.6 3.6  CL 101 98 98 101  97*  CO2 27 29 30 29 29   GLUCOSE 92 91 94 92 91  BUN 44* 45* 44* 38* 37*  CREATININE 1.04* 0.96 1.04* 0.91 1.04*  CALCIUM 8.7* 9.0 8.9 8.8* 8.9  MG 1.8 1.9 2.0 2.0 1.9  PHOS  --   --  2.4*  --   --    GFR: Estimated Creatinine Clearance: 70.6 mL/min (A) (by C-G formula based on SCr of 1.04 mg/dL (H)). Liver Function Tests: Recent Labs  Lab 03/16/18 0513 03/18/18 0446 03/19/18 0429  AST  --   --  17  ALT  --   --  6  ALKPHOS  --   --  53  BILITOT  --   --  2.1*  PROT  --   --  5.3*  ALBUMIN 2.0* 2.2* 2.0*   No results for input(s): LIPASE, AMYLASE in the last 168 hours. Recent Labs  Lab 03/17/18 1208 03/20/18 0432  AMMONIA 39* 29   Coagulation Profile: No results for input(s): INR, PROTIME in the last 168 hours. Cardiac Enzymes: No results for input(s): CKTOTAL, CKMB, CKMBINDEX, TROPONINI in the last 168 hours. BNP (last 3 results) No results for input(s): PROBNP in the last 8760 hours. HbA1C: No results for  input(s): HGBA1C in the last 72 hours. CBG: Recent Labs  Lab 03/19/18 1250 03/19/18 1708 03/19/18 2047 03/20/18 0757 03/20/18 1230  GLUCAP 88 89 87 91 86   Lipid Profile: No results for input(s): CHOL, HDL, LDLCALC, TRIG, CHOLHDL, LDLDIRECT in the last 72 hours. Thyroid Function Tests: No results for input(s): TSH, T4TOTAL, FREET4, T3FREE, THYROIDAB in the last 72 hours. Anemia Panel: No results for input(s): VITAMINB12, FOLATE, FERRITIN, TIBC, IRON, RETICCTPCT in the last 72 hours. Urine analysis: No results found for: COLORURINE, APPEARANCEUR, LABSPEC, PHURINE, GLUCOSEU, HGBUR, BILIRUBINUR, KETONESUR, PROTEINUR, UROBILINOGEN, NITRITE, LEUKOCYTESUR Sepsis Labs: Invalid input(s): PROCALCITONIN, LACTICIDVEN  Recent Results (from the past 240 hour(s))  Body fluid culture     Status: None (Preliminary result)   Collection Time: 03/17/18 11:53 AM  Result Value Ref Range Status   Specimen Description   Final    PARACENTESIS PERITONEAL Performed at [facility] [address] [place], [place] [address]    Special Requests   Final    NONE Performed at [facility], [place] [address], [place], [place] [address]    Gram Stain   Final    WBC PRESENT, PREDOMINANTLY PMN NO ORGANISMS SEEN CYTOSPIN SMEAR    Culture   Final    NO GROWTH 3 DAYS Performed at [facility], [place] [address], [place], [place] [address]    Report Status PENDING  Incomplete     Radiology Studies: No results found. Areej Tayler T. [facility] Pager [phone]  If 7PM-7AM, please contact night-coverage [email or web address] Password [facility] 03/20/2018, 12:44 PM

## 2018-03-21 DIAGNOSIS — E440 Moderate protein-calorie malnutrition: Secondary | ICD-10-CM

## 2018-03-21 LAB — BASIC METABOLIC PANEL
ANION GAP: 9 (ref 5–15)
BUN: 39 mg/dL — ABNORMAL HIGH (ref 8–23)
CO2: 29 mmol/L (ref 22–32)
Calcium: 8.9 mg/dL (ref 8.9–10.3)
Chloride: 96 mmol/L — ABNORMAL LOW (ref 98–111)
Creatinine, Ser: 1.08 mg/dL — ABNORMAL HIGH (ref 0.44–1.00)
GFR calc Af Amer: 60 mL/min — ABNORMAL LOW (ref >60)
GFR calc non Af Amer: 52 mL/min — ABNORMAL LOW (ref >60)
Glucose, Bld: 87 mg/dL (ref 70–99)
Potassium: 3.9 mmol/L (ref 3.5–5.1)
Sodium: 134 mmol/L — ABNORMAL LOW (ref 135–145)

## 2018-03-21 LAB — GLUCOSE, CAPILLARY
Glucose-Capillary: 117 mg/dL — ABNORMAL HIGH (ref 70–99)
Glucose-Capillary: 153 mg/dL — ABNORMAL HIGH (ref 70–99)
Glucose-Capillary: 70 mg/dL (ref 70–99)
Glucose-Capillary: 81 mg/dL (ref 70–99)
Glucose-Capillary: 87 mg/dL (ref 70–99)

## 2018-03-21 LAB — MAGNESIUM: Magnesium: 1.9 mg/dL (ref 1.7–2.4)

## 2018-03-21 LAB — ALBUMIN: Albumin: 1.9 g/dL — ABNORMAL LOW (ref 3.5–5.0)

## 2018-03-21 LAB — HEMOGLOBIN AND HEMATOCRIT, BLOOD
HCT: 27.4 % — ABNORMAL LOW (ref 36.0–46.0)
Hemoglobin: 8.3 g/dL — ABNORMAL LOW (ref 12.0–15.0)

## 2018-03-21 MED ORDER — ENSURE ENLIVE PO LIQD
237.0000 mL | Freq: Two times a day (BID) | ORAL | Status: DC
  Administered 2018-03-21 – 2018-03-27 (×4): 237 mL via ORAL

## 2018-03-21 MED ORDER — TRAMADOL HCL 50 MG PO TABS
50.0000 mg | ORAL_TABLET | Freq: Two times a day (BID) | ORAL | Status: DC | PRN
  Administered 2018-03-21 – 2018-03-27 (×7): 50 mg via ORAL
  Filled 2018-03-21 (×7): qty 1

## 2018-03-21 MED ORDER — ADULT MULTIVITAMIN W/MINERALS CH
1.0000 | ORAL_TABLET | Freq: Every day | ORAL | Status: DC
  Administered 2018-03-21 – 2018-03-27 (×7): 1 via ORAL
  Filled 2018-03-21 (×7): qty 1

## 2018-03-21 NOTE — Progress Notes (Signed)
Physical Therapy Treatment Patient Details Name: Latasha Guzman MRN: [id number] DOB: [birth year] Today's Date: 03/21/2018    History of Present Illness 73 year old female who presented dyspnea.  She does have a past medical history of atrial fibrillation, type 2 diabetes mellitus, pacemaker, hypertension.  She presented to [facility] from assisted living facility with  a hemoglobin of 6.7, along with melanotic stools, afib.  She was transferred to [facility] for further GI workup.  Pt was ultimately dx with UGIB, s/p 3 units total of PRBCs, and acute on chronic diastolic CHF exacerbation/pulmonary HTN, liver cirrhosis/ascites s/p paracentesis on 1/24 (2 L taken off), hypokalemia/hypomagnesemia, and A-fib with RVR, and bladder outlet obstruction requiring a temporary foley catheter.      PT Comments    Pt participated in bed level exercises, rolling for peri care and was agreeable for lift OOB to chair for some change of position and upright time.  Chair was padded with extra cushion to help with tolerance due to sacral wound.  She reported she was in the chair for 4 hours last time and that was too long.  I agreed and am going to check back on her in ~30-60 mins to see if she is ready for return to bed.  She was reluctant at first to work with therapy, but happy she did at the end of the session.  She remains appropriate for SNF level rehab at discharge. PT will continue to follow acutely for safe mobility progression   Follow Up Recommendations  SNF     Equipment Recommendations  Hospital bed;Other (comment)(hoyer lift, air mattress overlay)    Recommendations for Other Services   NA     Precautions / Restrictions Precautions Precautions: Fall;Other (comment) Precaution Comments: sacral decubitus    Mobility  Bed Mobility Overal bed mobility: Needs Assistance Bed Mobility: Rolling Rolling: Min assist         General bed mobility comments: Min assist to roll bil, pt likes to cross  legs while rolling and manages her upper body and trunk via bed rail.  Rolled for peri care/urination in bed pan and for maxi move pad placement.   Transfers Overall transfer level: Needs assistance               General transfer comment: Maxi move utilized for OOB time.  Pt reports she cannot remeber when the last time she stood on her own two feet was (months per pt).   Ambulation/Gait             General Gait Details: unable             Cognition Arousal/Alertness: Awake/alert Behavior During Therapy: WFL for tasks assessed/performed Overall Cognitive Status: Within Functional Limits for tasks assessed                                 General Comments: Not specifically tested, but conversation normal.       Exercises General Exercises - Upper Extremity Shoulder Flexion: AROM;Both;10 reps;Supine General Exercises - Lower Extremity Ankle Circles/Pumps: AROM;Both;10 reps;Supine Quad Sets: AROM;Both;5 reps;Supine(painful) Heel Slides: AAROM;Both;10 reps;Supine Hip ABduction/ADduction: AAROM;Both;10 reps;Supine        Pertinent Vitals/Pain Pain Assessment: Faces Faces Pain Scale: Hurts even more Pain Location: buttocks, back, bil knees Pain Descriptors / Indicators: Grimacing;Sore Pain Intervention(s): Limited activity within patient's tolerance;Monitored during session;Repositioned;Patient requesting pain meds-RN notified           PT Goals (  current goals can now be found in the care plan section) Acute Rehab PT Goals Patient Stated Goal: to not hurt when she moves Progress towards PT goals: Progressing toward goals    Frequency    Min 2X/week      PT Plan Current plan remains appropriate       AM-PAC PT "6 Clicks" Mobility   Outcome Measure  Help needed turning from your back to your side while in a flat bed without using bedrails?: A Little Help needed moving from lying on your back to sitting on the side of a flat bed  without using bedrails?: Total Help needed moving to and from a bed to a chair (including a wheelchair)?: Total Help needed standing up from a chair using your arms (e.g., wheelchair or bedside chair)?: Total Help needed to walk in hospital room?: Total Help needed climbing 3-5 steps with a railing? : Total 6 Click Score: 8    End of Session   Activity Tolerance: Patient limited by pain Patient left: in chair;with call bell/phone within reach;with nursing/sitter in room Nurse Communication: Mobility status;Need for lift equipment PT Visit Diagnosis: Muscle weakness (generalized) (M62.81);Unsteadiness on feet (R26.81)     Time: 1962-2297 PT Time Calculation (min) (ACUTE ONLY): 42 min  Charges:  $Therapeutic Exercise: 8-22 mins $Therapeutic Activity: 23-37 mins                    Suad Autrey B. Ali Mohl, PT, DPT  Acute Rehabilitation [phone] pager #[phone] office   03/21/2018, 2:47 PM

## 2018-03-21 NOTE — Progress Notes (Signed)
PROGRESS NOTE  MOSETTA FERDINAND ZHG:[id number] DOB: 1945/04/01 DOA: 03/04/2018 PCP: Nicoletta Dress, MD   LOS: 17 days    Brief Narrative / Interim history: 73 year old female with history of chronic A. Fib with pacemaker, DM 2, hypertension, diastolic CHF and lymphedema transferred from [facility] due to symptomatic anemia in the setting of GI bleed.  Hemoglobin 6.3 on arrival here.  Chest x-ray with bilateral interstitial infiltrate with bilateral pleural effusion and vascular congestion.  EKG with atrial fibrillation with RVR to 123 and PVCs.  She was transfused 2 units of blood with appropriate response.  IMA globin remained stable between 8 and 9. Per previous provider, GI consulted and recommended observation as hemoglobin remained stable.  Apixaban resumed.  Hemoglobin slowly trended down to 7.8.  Transfused 1 unit of blood appropriate response.  Patient was aggressively diuresed with IV lasix 80 mg 3 times daily for fluid overload and pulmonary vascular congestion. On 1/23, abdominal pain concerning for ascites.  No history of cirrhosis, hepatitis or EtOH use per patient or chart review.  Ammonia on the upper side of normal at 39.  Limited abdominal ultrasound showed moderate ascites in all quadrants.  Had paracentesis with removal of 2 L on 1/24.  Abdominal exam, fluid cytology and culture not consistent with SBP. SAAG greater than 1.1 suggestive for some portal hypertension.  RUQ ultrasound obtained and confirmed liver cirrhosis with patent portal vein and normal blood flow.   On 1/25 patient was transitioned to torsemide 80 mg daily.  Also started on Aldactone 50 mg daily and lactulose.   On 1/27, torsemide reduced further to 40 mg daily.  Aldactone increased to 100 mg daily  Subjective: No major events overnight of this morning.  Refused to take lactulose yesterday because she had multiple bowel movement the day before.  She had 2 bowel movements yesterday.  Denies chest pain, dyspnea or  abdominal pain.  Reports making a lot of urine.  Assessment & Plan: Principal Problem:   GI bleed Active Problems:   Acute blood loss anemia   Chronic atrial fibrillation   Diabetes mellitus (HCC)   Essential hypertension   GERD (gastroesophageal reflux disease)   Acute diastolic CHF (congestive heart failure) (HCC)   Acute GI bleeding   Pressure injury of skin   Shortness of breath   Hematochezia  Acute symptomatic anemia likely due to upper GI bleed: Received 2 units on admission and 1 unit on 1/23 with appropriate response.  Hemoglobin remained stable. -Continue monitoring.  Acute on chronic diastolic CHF exacerbation/pulmonary hypertension: Echo with EF of 55 to 60%, no wall motion abnormalities but some diastolic flattening, severe LAE and PA PP of 64.  Chest x-ray with pulmonary congestion and pleural effusion on admission.  Still with significant lymphedema and dependent edema which is likely a combination of CHF, cirrhosis and hypoalbuminemia.  Fair response to IV Lasix 80 mg 3 times daily.  Transitioned to oral torsemide 80 mg daily with Aldactone 50 mg daily on 1/25, then torsemide 40 mg and Aldactone to 100 mg on 1/27.  Continue to diurese well although U OP was not captured appropriately.  -Continue torsemide 40 mg daily and Aldactone 100 mg daily -Closely monitor renal function.  Slight uptrend -Continue lactulose and adjust dose for 2-3 bowel movement a day -Daily weight, intake output and renal function -Replenish electrolytes as appropriate  Liver cirrhosis/ascites:  noted on abdominal ultrasound although patient denies history of liver disease, hepatitis or EtOH.  No signs of SBP.  Albumin low.  Ammonia 39.  Paracentesis yielded 2 L on 1/24.  Cytology and culture not consistent with SBP.  No encephalopathy. -Continue torsemide, Aldactone and lactulose as above.  Dependent edema/lymphedema: Improved -Manage as above  Hypokalemia/hypomagnesemia: Resolved -Monitor and  replenish   Atrial fibrillation with RVR: RVR resolved. -Continue beta-blocker, CCB and Eliquis.  CKD 3: Slight uptrend -Continue monitoring  DM2: -CBG monitoring -SSI  Bladder outlet obstruction: urinary obstruction noted on bladder scan.  Foley catheter placed on admission.  Failed voiding trial 1/26.  Passed voiding trial with Urecholine 1/27. -Continue monitoring  Scheduled Meds: . sodium chloride   Intravenous Once  . apixaban  5 mg Oral BID  . bethanechol  10 mg Oral TID  . collagenase   Topical Daily  . diltiazem  90 mg Oral Q6H  . hydrocortisone   Rectal BID  . insulin aspart  0-5 Units Subcutaneous QHS  . insulin aspart  0-9 Units Subcutaneous TID WC  . lactulose  30 g Oral Daily  . metoprolol tartrate  25 mg Oral BID  . pantoprazole  40 mg Oral Daily  . pneumococcal 23 valent vaccine  0.5 mL Intramuscular Tomorrow-1000  . sodium chloride flush  3 mL Intravenous Q12H  . spironolactone  100 mg Oral Daily  . torsemide  40 mg Oral Daily   Continuous Infusions: PRN Meds:.acetaminophen **OR** acetaminophen, albuterol, guaiFENesin-codeine, lip balm, ondansetron **OR** ondansetron (ZOFRAN) IV, traZODone  DVT prophylaxis: On Eliquis for A. fib Code Status: Full code Family Communication: No family member at bedside Disposition Plan: Medically ready for discharge pending insurance authorization for SNF.  Consultants:   None  Procedures:   None  Antimicrobials:  None  Objective: Vitals:   03/20/18 1007 03/20/18 1012 03/21/18 0021 03/21/18 0542  BP:  (!) 99/53 100/64 109/70  Pulse: 98 97 92 98  Resp:  16 20 14   Temp:  97.8 F (36.6 C) 98.6 F (37 C) 98.7 F (37.1 C)  TempSrc:  Oral Oral Oral  SpO2:  97% 95% 94%  Weight:      Height:        Intake/Output Summary (Last 24 hours) at 03/21/2018 1116 Last data filed at 03/21/2018 0935 Gross per 24 hour  Intake 340 ml  Output 550 ml  Net -210 ml   Filed Weights   03/15/18 0441 03/16/18 0611 03/17/18  0542  Weight: 135.2 kg 126.1 kg 132.9 kg    Examination:  GENERAL: Appears well. No acute distress.  HEENT: MMM.  Vision and Hearing grossly intact.  NECK: Supple.  No JVD.  LUNGS:  No IWOB. Good air movement. CTAB.  HEART:  RRR. Heart sounds normal.  ABD: Bowel sounds present. Soft.  Dullness to percussion laterally EXT:   no edema bilaterally.  SKIN: no apparent skin lesion.  No jaundice NEURO: Awake, alert and oriented appropriately.  No gross deficit.  PSYCH: Calm. Normal affect.  Data Reviewed: I have independently reviewed following labs and imaging studies   CBC: Recent Labs  Lab 03/15/18 0840 03/16/18 0513 03/17/18 0408 03/19/18 0429 03/20/18 0432 03/21/18 0435  WBC 15.9* 12.9* 13.2*  --   --   --   HGB 8.1* 7.8* 9.0* 8.3* 8.9* 8.3*  HCT 26.5* 26.1* 29.4* 27.2* 29.2* 27.4*  MCV 94.6 94.6 93.0  --   --   --   PLT 339 303 319  --   --   --    Basic Metabolic Panel: Recent Labs  Lab 03/17/18 0408 03/18/18 0446  03/19/18 0429 03/20/18 0432 03/21/18 0435  NA 136 136 137 134* 134*  K 3.3* 3.1* 3.6 3.6 3.9  CL 98 98 101 97* 96*  CO2 29 30 29 29 29   GLUCOSE 91 94 92 91 87  BUN 45* 44* 38* 37* 39*  CREATININE 0.96 1.04* 0.91 1.04* 1.08*  CALCIUM 9.0 8.9 8.8* 8.9 8.9  MG 1.9 2.0 2.0 1.9 1.9  PHOS  --  2.4*  --   --   --    GFR: Estimated Creatinine Clearance: 68 mL/min (A) (by C-G formula based on SCr of 1.08 mg/dL (H)). Liver Function Tests: Recent Labs  Lab 03/16/18 0513 03/18/18 0446 03/19/18 0429 03/21/18 0435  AST  --   --  17  --   ALT  --   --  6  --   ALKPHOS  --   --  53  --   BILITOT  --   --  2.1*  --   PROT  --   --  5.3*  --   ALBUMIN 2.0* 2.2* 2.0* 1.9*   No results for input(s): LIPASE, AMYLASE in the last 168 hours. Recent Labs  Lab 03/17/18 1208 03/20/18 0432  AMMONIA 39* 29   Coagulation Profile: No results for input(s): INR, PROTIME in the last 168 hours. Cardiac Enzymes: No results for input(s): CKTOTAL, CKMB, CKMBINDEX,  TROPONINI in the last 168 hours. BNP (last 3 results) No results for input(s): PROBNP in the last 8760 hours. HbA1C: No results for input(s): HGBA1C in the last 72 hours. CBG: Recent Labs  Lab 03/20/18 0757 03/20/18 1230 03/20/18 1728 03/21/18 0018 03/21/18 0806  GLUCAP 91 86 81 81 70   Lipid Profile: No results for input(s): CHOL, HDL, LDLCALC, TRIG, CHOLHDL, LDLDIRECT in the last 72 hours. Thyroid Function Tests: No results for input(s): TSH, T4TOTAL, FREET4, T3FREE, THYROIDAB in the last 72 hours. Anemia Panel: No results for input(s): VITAMINB12, FOLATE, FERRITIN, TIBC, IRON, RETICCTPCT in the last 72 hours. Urine analysis: No results found for: COLORURINE, APPEARANCEUR, LABSPEC, PHURINE, GLUCOSEU, HGBUR, BILIRUBINUR, KETONESUR, PROTEINUR, UROBILINOGEN, NITRITE, LEUKOCYTESUR Sepsis Labs: Invalid input(s): PROCALCITONIN, LACTICIDVEN  Recent Results (from the past 240 hour(s))  Body fluid culture     Status: None   Collection Time: 03/17/18 11:53 AM  Result Value Ref Range Status   Specimen Description   Final    PARACENTESIS PERITONEAL Performed at [facility] [address] [place], [place] [address]    Special Requests   Final    NONE Performed at [facility], [place] [address], [place], [place] [address]    Gram Stain   Final    WBC PRESENT, PREDOMINANTLY PMN NO ORGANISMS SEEN CYTOSPIN SMEAR    Culture   Final    NO GROWTH 3 DAYS Performed at [facility], [place] [address] [place], [place] [address]    Report Status 03/20/2018 FINAL  Final     Radiology Studies: No results found. Phil Corti T. [facility] Pager [phone]  If 7PM-7AM, please contact night-coverage [email or web address] Password [email or web address] 03/21/2018, 11:16 AM

## 2018-03-21 NOTE — Progress Notes (Signed)
Initial Nutrition Assessment  DOCUMENTATION CODES:   Obesity unspecified, Non-severe (moderate) malnutrition in context of chronic illness  INTERVENTION:    Ensure Enlive po BID, each supplement provides 350 kcal and 20 grams of protein  MVI daily  NUTRITION DIAGNOSIS:   Moderate Malnutrition related to chronic illness(CHF) as evidenced by mild fat depletion, severe muscle depletion, edema.  GOAL:   Patient will meet greater than or equal to 90% of their needs   MONITOR:   PO intake, Supplement acceptance, Weight trends, Labs, I & O's  REASON FOR ASSESSMENT:   LOS   ASSESSMENT:   Patient with PMH significant for GERD, CHF, DM, essential HTN, and HLD. Presents this admission with acute GI bleed and CHF exacerbation.    Pt denies having a loss in appetite PTA. States she eats three meals with snacks daily. Pt is a poor historian, suspect this could be a fabrication. Meal completions charted as 10-100% for her last 8 meals (3 out of 8 meals charted as 100%). Discussed the importance of protein intake for preservation of lean body mass. Pt has tried Musician in the past and does not like either. Amendable to Ensure.   Pt unsure of UBW or if she has lost weight. Records indicate pt diuresed down to 102.6 kg this admission but wt is now trending up to 122.5 kg today. Pt continues on lasix.   Will use EDW of 102.6 kg to estimated needs.   Medications reviewed and include: SSI, lactulose, aldactone, demadex 40 mg once daily Labs reviewed: Na 134 (L)   NUTRITION - FOCUSED PHYSICAL EXAM:    Most Recent Value  Orbital Region  Mild depletion  Upper Arm Region  Severe depletion  Thoracic and Lumbar Region  Unable to assess  Buccal Region  Mild depletion  Temple Region  Severe depletion  Clavicle Bone Region  Severe depletion  Clavicle and Acromion Bone Region  Severe depletion  Scapular Bone Region  Unable to assess  Dorsal Hand  No depletion  Patellar Region  No  depletion  Anterior Thigh Region  No depletion  Posterior Calf Region  No depletion  Edema (RD Assessment)  Moderate     Diet Order:   Diet Order            Diet Heart Room service appropriate? Yes; Fluid consistency: Thin; Fluid restriction: 1500 mL Fluid  Diet effective now              EDUCATION NEEDS:   Education needs have been addressed  Skin:  Skin Assessment: Skin Integrity Issues: Skin Integrity Issues:: Other (Comment), DTI, Stage III DTI: sacrum Stage III: sacrum Other: right/left heel  Last BM:  1/27  Height:   Ht Readings from Last 1 Encounters:  03/04/18 5\' 7"  (1.702 m)    Weight:   Wt Readings from Last 1 Encounters:  03/21/18 122.5 kg    Ideal Body Weight:  61.4 kg  BMI:  Body mass index is 42.29 kg/m.  Estimated Nutritional Needs:   Kcal:  1800-2000 kcal  Protein:  90-105 grams  Fluid:  >/= 1.8 L/day   Mariana Single RD, LDN Clinical Nutrition Pager # - [phone]

## 2018-03-21 NOTE — Progress Notes (Signed)
Occupational Therapy Treatment Patient Details Name: Latasha Guzman MRN: [id number] DOB: 1945-11-14 Today's Date: 03/21/2018    History of present illness 73 year old female who presented dyspnea.  She does have a past medical history of atrial fibrillation, type 2 diabetes mellitus, pacemaker, hypertension.  She presented to [facility] from assisted living facility with  a hemoglobin of 6.7, along with melanotic stools, afib.  She was transferred to [facility] for further GI workup.  Pt was ultimately dx with UGIB, s/p 3 units total of PRBCs, and acute on chronic diastolic CHF exacerbation/pulmonary HTN, liver cirrhosis/ascites s/p paracentesis on 1/24 (2 L taken off), hypokalemia/hypomagnesemia, and A-fib with RVR, and bladder outlet obstruction requiring a temporary foley catheter.     OT comments  Pt making slow, steady progress toward stated goals, with generalized weakness and pain limiting ability to engage in BADL at desired level of ind this date. Pt up in chair upon OT arrival, OT offered theraband exercises. Despite education and encouragement, pt refusing stating that "there is nothing wrong with my arms". Assisted pt with transfer from bed to chair for pressure relief, considering decubitus ulcer. Maximove used +2 for transfer back to bed. Pt repositioned in bed with pressure relief cushion under sacrum, per pt request. Min A currently for rolling to change out soiled bed pads. Pt currently remains total A for toileting and OOB ADLs. Per pt report she cannot remember the last time she has stood on her own for ADL. SNF remains an appropriate d/c plan. Will continue to follow acutely.    Follow Up Recommendations  SNF    Equipment Recommendations  Other (comment)(defer to next venue)    Recommendations for Other Services      Precautions / Restrictions Precautions Precautions: Fall;Other (comment) Precaution Comments: sacral decubitus Restrictions Weight Bearing Restrictions: No        Mobility Bed Mobility Overal bed mobility: Needs Assistance Bed Mobility: Rolling Rolling: Min assist;Independent         General bed mobility comments: min A to roll for repositioning of bottom with cushion per pt request; pt able to grib and maintain sidelying on bed rails once assisted   Transfers Overall transfer level: Needs assistance               General transfer comment: maxi move used for chair to bed transfer    Balance                                           ADL either performed or assessed with clinical judgement   ADL Overall ADL's : Needs assistance/impaired Eating/Feeding: Independent;Sitting                                   Functional mobility during ADLs: (2 person A for positioning in bed) General ADL Comments: pt completing BADL at set up A level in supine, able to tolerate sitting up in chair this date and was engaging in BADL at tray table     Vision Patient Visual Report: No change from baseline     Perception     Praxis      Cognition Arousal/Alertness: Awake/alert Behavior During Therapy: WFL for tasks assessed/performed Overall Cognitive Status: Within Functional Limits for tasks assessed  General Comments: Not specifically tested, but conversation normal.         Exercises Exercises: General Lower Extremity;General Upper Extremity General Exercises - Upper Extremity Shoulder Flexion: AROM;Both;10 reps;Supine General Exercises - Lower Extremity Ankle Circles/Pumps: AROM;Both;10 reps;Supine Quad Sets: AROM;Both;5 reps;Supine(painful) Heel Slides: AAROM;Both;10 reps;Supine Hip ABduction/ADduction: AAROM;Both;10 reps;Supine   Shoulder Instructions       General Comments      Pertinent Vitals/ Pain       Pain Assessment: Faces Faces Pain Scale: Hurts even more Pain Location: buttocks, back, B knees Pain Descriptors / Indicators:  Grimacing;Sore;Aching Pain Intervention(s): Limited activity within patient's tolerance;Monitored during session;Repositioned  Home Living                                          Prior Functioning/Environment              Frequency  Min 2X/week        Progress Toward Goals  OT Goals(current goals can now be found in the care plan section)  Progress towards OT goals: Progressing toward goals  Acute Rehab OT Goals Patient Stated Goal: to increase comfort in position OT Goal Formulation: With patient Time For Goal Achievement: 03/26/18 Potential to Achieve Goals: Good  Plan Discharge plan remains appropriate    Co-evaluation                 AM-PAC OT "6 Clicks" Daily Activity     Outcome Measure   Help from another person eating meals?: None Help from another person taking care of personal grooming?: A Little Help from another person toileting, which includes using toliet, bedpan, or urinal?: Total Help from another person bathing (including washing, rinsing, drying)?: Total Help from another person to put on and taking off regular upper body clothing?: A Little Help from another person to put on and taking off regular lower body clothing?: Total 6 Click Score: 13    End of Session Equipment Utilized During Treatment: Other (comment)(maximove)  OT Visit Diagnosis: Other abnormalities of gait and mobility (R26.89);Muscle weakness (generalized) (M62.81);Pain Pain - part of body: (generalized)   Activity Tolerance Patient tolerated treatment well   Patient Left in bed;with call bell/phone within reach   Nurse Communication Mobility status        Time: 3007-6226 OT Time Calculation (min): 12 min  Charges: OT General Charges $OT Visit: 1 Visit OT Treatments $Self Care/Home Management : 8-22 mins  Zenovia Jarred, MSOT, OTR/L [place] Office: [phone]  Zenovia Jarred 03/21/2018, 5:08 PM

## 2018-03-22 ENCOUNTER — Inpatient Hospital Stay (HOSPITAL_COMMUNITY): Payer: 59

## 2018-03-22 DIAGNOSIS — I5031 Acute diastolic (congestive) heart failure: Secondary | ICD-10-CM

## 2018-03-22 DIAGNOSIS — D62 Acute posthemorrhagic anemia: Principal | ICD-10-CM

## 2018-03-22 DIAGNOSIS — K922 Gastrointestinal hemorrhage, unspecified: Secondary | ICD-10-CM

## 2018-03-22 LAB — BASIC METABOLIC PANEL
Anion gap: 9 (ref 5–15)
BUN: 41 mg/dL — ABNORMAL HIGH (ref 8–23)
CO2: 28 mmol/L (ref 22–32)
Calcium: 9.1 mg/dL (ref 8.9–10.3)
Chloride: 96 mmol/L — ABNORMAL LOW (ref 98–111)
Creatinine, Ser: 1.21 mg/dL — ABNORMAL HIGH (ref 0.44–1.00)
GFR calc Af Amer: 52 mL/min — ABNORMAL LOW (ref >60)
GFR calc non Af Amer: 45 mL/min — ABNORMAL LOW (ref >60)
Glucose, Bld: 91 mg/dL (ref 70–99)
Potassium: 3.8 mmol/L (ref 3.5–5.1)
Sodium: 133 mmol/L — ABNORMAL LOW (ref 135–145)

## 2018-03-22 LAB — CBC
HCT: 25 % — ABNORMAL LOW (ref 36.0–46.0)
Hemoglobin: 7.7 g/dL — ABNORMAL LOW (ref 12.0–15.0)
MCH: 28.6 pg (ref 26.0–34.0)
MCHC: 30.8 g/dL (ref 30.0–36.0)
MCV: 92.9 fL (ref 80.0–100.0)
Platelets: 236 10*3/uL (ref 150–400)
RBC: 2.69 MIL/uL — ABNORMAL LOW (ref 3.87–5.11)
RDW: 19.2 % — ABNORMAL HIGH (ref 11.5–15.5)
WBC: 9.3 10*3/uL (ref 4.0–10.5)
nRBC: 0 % (ref 0.0–0.2)

## 2018-03-22 LAB — HEPARIN LEVEL (UNFRACTIONATED): Heparin Unfractionated: >2.2 IU/mL — ABNORMAL HIGH (ref 0.30–0.70)

## 2018-03-22 LAB — GLUCOSE, CAPILLARY
Glucose-Capillary: 95 mg/dL (ref 70–99)
Glucose-Capillary: 93 mg/dL (ref 70–99)
Glucose-Capillary: 84 mg/dL (ref 70–99)
Glucose-Capillary: 90 mg/dL (ref 70–99)

## 2018-03-22 LAB — HEMOGLOBIN AND HEMATOCRIT, BLOOD
HCT: 27.3 % — ABNORMAL LOW (ref 36.0–46.0)
HEMOGLOBIN: 8.3 g/dL — AB (ref 12.0–15.0)

## 2018-03-22 LAB — PROTIME-INR
INR: 2.72
Prothrombin Time: 28.5 seconds — ABNORMAL HIGH (ref 11.4–15.2)

## 2018-03-22 LAB — MAGNESIUM: Magnesium: 1.9 mg/dL (ref 1.7–2.4)

## 2018-03-22 LAB — APTT: aPTT: 54 seconds — ABNORMAL HIGH (ref 24–36)

## 2018-03-22 MED ORDER — HEPARIN (PORCINE) 25000 UT/250ML-% IV SOLN: 1100.0000 [IU]/h | INTRAVENOUS | Status: DC

## 2018-03-22 MED ORDER — LIDOCAINE HCL (PF) 2 % IJ SOLN
0.0000 mL | Freq: Once | INTRAMUSCULAR | Status: DC | PRN
  Filled 2018-03-22: qty 20

## 2018-03-22 MED ORDER — DILTIAZEM HCL 30 MG PO TABS
30.0000 mg | ORAL_TABLET | Freq: Once | ORAL | Status: AC
  Administered 2018-03-22: 30 mg via ORAL
  Filled 2018-03-22: qty 1

## 2018-03-22 MED ORDER — HEPARIN (PORCINE) 25000 UT/250ML-% IV SOLN
1000.0000 [IU]/h | INTRAVENOUS | Status: DC
  Administered 2018-03-23: 1000 [IU]/h via INTRAVENOUS
  Filled 2018-03-22: qty 250

## 2018-03-22 MED ORDER — SPIRONOLACTONE 25 MG PO TABS: 25.0000 mg | ORAL_TABLET | Freq: Every day | ORAL | Status: DC

## 2018-03-22 NOTE — Progress Notes (Signed)
ANTICOAGULATION CONSULT NOTE - Initial Consult  Pharmacy Consult for heparin drip (apixaban on hold) Indication: atrial fibrillation  Allergies  Allergen Reactions  . Iodine Hives, Other (See Comments) and Shortness Of Breath    Throat swells  . Iodinated Diagnostic Agents   . Penicillins Hives    Has patient had a PCN reaction causing immediate rash, facial/tongue/throat swelling, SOB or lightheadedness with hypotension: Yes Has patient had a PCN reaction causing severe rash involving mucus membranes or skin necrosis: No Has patient had a PCN reaction that required hospitalization: No Has patient had a PCN reaction occurring within the last 10 years: No If all of the above answers are "NO", then may proceed with Cephalosporin use.     Patient Measurements: Height: 5\' 7"  (170.2 cm) Weight: 285 lb (129.3 kg) IBW/kg (Calculated) : 61.6 Heparin Dosing Weight: 80 kg (total weight = 88.5 kg)  Vital Signs: Temp: 97.9 F (36.6 C) (01/29 1405) Temp Source: Oral (01/29 1405) BP: 93/59 (01/29 1808) Pulse Rate: 99 (01/29 1808)  Labs: Recent Labs    03/20/18 0432 03/21/18 0435 03/22/18 0458 03/22/18 1757  HGB 8.9* 8.3* 8.3* 7.7*  HCT 29.2* 27.4* 27.3* 25.0*  PLT  --   --   --  236  APTT  --   --   --  54*  LABPROT  --   --   --  28.5*  INR  --   --   --  2.72  HEPARINUNFRC  --   --   --  >2.20*  CREATININE 1.04* 1.08* 1.21*  --     Estimated Creatinine Clearance: 59.7 mL/min (A) (by C-G formula based on SCr of 1.21 mg/dL (H)).   Medical History: Past Medical History:  Diagnosis Date  . Acute blood loss anemia 08/02/2017  . Atrial fibrillation ([place])   . Chronic atrial fibrillation 10/15/2015  . Chronic diastolic CHF (congestive heart failure) ([place])   . Diabetes mellitus ([place]) 07/27/2017  . Edema   . Essential hypertension 10/15/2015  . GERD (gastroesophageal reflux disease) 07/27/2017  . GI bleed 07/24/2017  . Hyperlipidemia   . Pacemaker 10/15/2015  . Pneumonia of right  lower lobe due to infectious organism ([place]) 08/02/2017  . Skin rash 08/02/2017  . Type 2 diabetes mellitus without complication ([place]) 8/75/6433   Assessment: Pharmacy consulted to dose and monitor heparin infusion in this 73 year old female. Pt was admitted on 03/04/18 with symptomatic anemia and GIB - apixaban was resumed on 03/08/18 with no interruptions. Hgb has been low but stable.   Planning for bedside debridement of sacral wound - apixaban being held. Anticoagulation changed to heparin drip in anticipation of procedure.  Today, 03/22/18  Hgb 7.7 - MD aware. Ok to continue with heparin drip despite low Hgb since it has been stable  Plt - 236, WNL  Baseline HL and INR elevated which is to be expected as apixaban can falsely elevate these labs  Baseline aPTT (54) slightly elevated - possibly due to liver disease  Last apixaban dose charted 03/22/18 @ 0906  Goal of Therapy:  HL 0.3 - 0.7 units/hr aPTT 66-102 seconds Monitor platelets by anticoagulation protocol: Yes   Plan:   No bolus as patient is anticoagulated on apixaban   Start heparin infusion 12 hours after last apixaban dose  Start heparin infusion at 1000 units/hr  Check aPTT level in 8 hours  Check HL and CBC daily while on heparin. Once HL and aPTT correlate, can switch to monitoring using HL.  Continue to monitor H&H and platelets  Closely monitor for signs/symptoms of bleeding or thrombosis  Lenis Noon, PharmD 03/22/2018,7:53 PM

## 2018-03-22 NOTE — Progress Notes (Signed)
PROGRESS NOTE    Latasha Guzman  XTK:[id number] DOB: 1945-03-07 DOA: 03/04/2018 PCP: Nicoletta Dress, MD   Brief Narrative: 73 year old female with history of chronic A. Fib with pacemaker, DM 2, hypertension, diastolic CHF and lymphedema transferred from [facility] due to symptomatic anemia in the setting of GI bleed.  Hemoglobin 6.3 on arrival here.  Chest x-ray with bilateral interstitial infiltrate with bilateral pleural effusion and vascular congestion.  EKG with atrial fibrillation with RVR to 123 and PVCs.  She was transfused 2 units of blood with appropriate response.  IMA globin remained stable between 8 and 9. Per previous provider, GI consulted and recommended observation as hemoglobin remained stable.  Apixaban resumed.  Hemoglobin slowly trended down to 7.8.  Transfused 1 unit of blood appropriate response.  Patient was aggressively diuresed with IV lasix 80 mg 3 times daily for fluid overload and pulmonary vascular congestion. On 1/23, abdominal pain concerning for ascites.  No history of cirrhosis, hepatitis or EtOH use per patient or chart review.  Ammonia on the upper side of normal at 39.  Limited abdominal ultrasound showed moderate ascites in all quadrants.  Had paracentesis with removal of 2 L on 1/24.  Abdominal exam, fluid cytology and culture not consistent with SBP. SAAG greater than 1.1 suggestive for some portal hypertension.  RUQ ultrasound obtained and confirmed liver cirrhosis with patent portal vein and normal blood flow.   On 1/25 patient was transitioned to torsemide 80 mg daily.  Also started on Aldactone 50 mg daily and lactulose.   On 1/27, torsemide reduced further to 40 mg daily.  Aldactone increased to 100 mg daily  Assessment & Plan:   Principal Problem:   GI bleed Active Problems:   Acute blood loss anemia   Chronic atrial fibrillation   Diabetes mellitus (HCC)   Essential hypertension   GERD (gastroesophageal reflux disease)   Acute diastolic CHF  (congestive heart failure) (HCC)   Acute GI bleeding   Pressure injury of skin   Shortness of breath   Hematochezia   Moderate malnutrition (HCC)   Deep tissue pressure injury/unstageable pressure ulcer reviewed [facility] notes.  Will consult surgery to see if eschar can be debrided.  Acute on chronic diastolic CHF exacerbation/pulmonary hypertension: Echo with EF of 55 to 60%, no wall motion abnormalities but some diastolic flattening, severe LAE and PA PP of 64.  Chest x-ray with pulmonary congestion and pleural effusion on admission.  Still with significant lymphedema and dependent edema which is likely a combination of CHF, cirrhosis and hypoalbuminemia.  Fair response to IV Lasix 80 mg 3 times daily.  Transitioned to oral torsemide 80 mg daily with Aldactone 50 mg daily on 1/25, then torsemide 40 mg and Aldactone to 100 mg on 1/27.   -Continue torsemide 40 mg daily and Aldactone 100 mg daily -Renal functions continue to trend up   Liver cirrhosis/ascites:  noted on abdominal ultrasound although patient denies history of liver disease, hepatitis or EtOH.  No signs of SBP. Albumin low.  Ammonia 39.  Paracentesis yielded 2 L on 1/24.  Cytology and culture not consistent with SBP.  No encephalopathy. -Continue torsemide, Aldactone and lactulose as above.  Dependent edema/lymphedema: Improved -Manage as above  Hypokalemia/hypomagnesemia: Resolved -Monitor and replenish   Atrial fibrillation with RVR: RVR resolved. -Continue beta-blocker, CCB and Eliquis.  CKD 3: Slight uptrend -Continue monitoring  DM2: -CBG monitoring -SSI  Bladder outlet obstruction: urinary obstruction noted on bladder scan.  Foley catheter placed on admission.  Failed voiding trial 1/26.  Passed voiding trial with Urecholine 1/27. -Continue monitoring Pressure Injury 03/04/18 Deep Tissue Injury - Purple or maroon localized area of discolored intact skin or blood-filled blister due to damage of underlying soft  tissue from pressure and/or shear. (Active)  03/04/18 0500  Location: Sacrum  Location Orientation: Mid  Staging: Deep Tissue Injury - Purple or maroon localized area of discolored intact skin or blood-filled blister due to damage of underlying soft tissue from pressure and/or shear.  Wound Description (Comments):   Present on Admission: Yes     Pressure Injury 03/06/18 Stage III -  Full thickness tissue loss. Subcutaneous fat may be visible but bone, tendon or muscle are NOT exposed. (Active)  03/06/18 0721  Location: Sacrum  Location Orientation: Mid  Staging: Stage III -  Full thickness tissue loss. Subcutaneous fat may be visible but bone, tendon or muscle are NOT exposed.  Wound Description (Comments):   Present on Admission: Yes      Nutrition Problem: Moderate Malnutrition Etiology: chronic illness(CHF)     Signs/Symptoms: mild fat depletion, severe muscle depletion, edema    Interventions: Ensure Enlive (each supplement provides 350kcal and 20 grams of protein), MVI  Estimated body mass index is 44.64 kg/m as calculated from the following:   Height as of this encounter: 5\' 7"  (1.702 m).   Weight as of this encounter: 129.3 kg.  DVT prophylaxis:  Code Status:  Family Communication: Disposition Plan: Consultants:    Procedures: Antimicrobials:  Subjective:   Objective: Vitals:   03/22/18 0800 03/22/18 0852 03/22/18 0859 03/22/18 1044  BP:  (!) 93/50 (!) 101/52 (!) 91/56  Pulse: (!) 111 90 (!) 108 (!) 102  Resp:    14  Temp:    98 F (36.7 C)  TempSrc:    Oral  SpO2:    92%  Weight:      Height:        Intake/Output Summary (Last 24 hours) at 03/22/2018 1346 Last data filed at 03/21/2018 2233 Gross per 24 hour  Intake 3 ml  Output 200 ml  Net -197 ml   Filed Weights   03/17/18 0542 03/21/18 1323 03/22/18 0448  Weight: 132.9 kg 122.5 kg 129.3 kg    Examination:  General exam: Appears calm and comfortable  Respiratory system: Clear to  auscultation. Respiratory effort normal. Cardiovascular system: S1 & S2 heard, RRR. No JVD, murmurs, rubs, gallops or clicks. No pedal edema. Gastrointestinal system: Abdomen is nondistended, soft and nontender. No organomegaly or masses felt. Normal bowel sounds heard. Central nervous system: Alert and oriented. No focal neurological deficits. Extremities: Symmetric 5 x 5 power. Skin: No rashes, lesions or ulcers Psychiatry: Judgement and insight appear normal. Mood & affect appropriate.     Data Reviewed: I have personally reviewed following labs and imaging studies  CBC: Recent Labs  Lab 03/16/18 0513 03/17/18 0408 03/19/18 0429 03/20/18 0432 03/21/18 0435 03/22/18 0458  WBC 12.9* 13.2*  --   --   --   --   HGB 7.8* 9.0* 8.3* 8.9* 8.3* 8.3*  HCT 26.1* 29.4* 27.2* 29.2* 27.4* 27.3*  MCV 94.6 93.0  --   --   --   --   PLT 303 319  --   --   --   --    Basic Metabolic Panel: Recent Labs  Lab 03/18/18 0446 03/19/18 0429 03/20/18 0432 03/21/18 0435 03/22/18 0458  NA 136 137 134* 134* 133*  K 3.1* 3.6 3.6 3.9 3.8  CL 98 101 97* 96* 96*  CO2 30 29 29 29 28   GLUCOSE 94 92 91 87 91  BUN 44* 38* 37* 39* 41*  CREATININE 1.04* 0.91 1.04* 1.08* 1.21*  CALCIUM 8.9 8.8* 8.9 8.9 9.1  MG 2.0 2.0 1.9 1.9 1.9  PHOS 2.4*  --   --   --   --    GFR: Estimated Creatinine Clearance: 59.7 mL/min (A) (by C-G formula based on SCr of 1.21 mg/dL (H)). Liver Function Tests: Recent Labs  Lab 03/16/18 0513 03/18/18 0446 03/19/18 0429 03/21/18 0435  AST  --   --  17  --   ALT  --   --  6  --   ALKPHOS  --   --  53  --   BILITOT  --   --  2.1*  --   PROT  --   --  5.3*  --   ALBUMIN 2.0* 2.2* 2.0* 1.9*   No results for input(s): LIPASE, AMYLASE in the last 168 hours. Recent Labs  Lab 03/17/18 1208 03/20/18 0432  AMMONIA 39* 29   Coagulation Profile: No results for input(s): INR, PROTIME in the last 168 hours. Cardiac Enzymes: No results for input(s): CKTOTAL, CKMB, CKMBINDEX,  TROPONINI in the last 168 hours. BNP (last 3 results) No results for input(s): PROBNP in the last 8760 hours. HbA1C: No results for input(s): HGBA1C in the last 72 hours. CBG: Recent Labs  Lab 03/21/18 1200 03/21/18 1649 03/21/18 2135 03/22/18 0805 03/22/18 1231  GLUCAP 87 117* 153* 95 93   Lipid Profile: No results for input(s): CHOL, HDL, LDLCALC, TRIG, CHOLHDL, LDLDIRECT in the last 72 hours. Thyroid Function Tests: No results for input(s): TSH, T4TOTAL, FREET4, T3FREE, THYROIDAB in the last 72 hours. Anemia Panel: No results for input(s): VITAMINB12, FOLATE, FERRITIN, TIBC, IRON, RETICCTPCT in the last 72 hours. Sepsis Labs: No results for input(s): PROCALCITON, LATICACIDVEN in the last 168 hours.  Recent Results (from the past 240 hour(s))  Body fluid culture     Status: None   Collection Time: 03/17/18 11:53 AM  Result Value Ref Range Status   Specimen Description   Final    PARACENTESIS PERITONEAL Performed at [facility] [address] [place], [place] [address]    Special Requests   Final    NONE Performed at [facility], [place] [address] [place], [place] [address]    Gram Stain   Final    WBC PRESENT, PREDOMINANTLY PMN NO ORGANISMS SEEN CYTOSPIN SMEAR    Culture   Final    NO GROWTH 3 DAYS Performed at [facility], [place] [address], [place], [place] [address]    Report Status 03/20/2018 FINAL  Final         Radiology Studies: No results found.      Scheduled Meds: . sodium chloride   Intravenous Once  . apixaban  5 mg Oral BID  . bethanechol  10 mg Oral TID  . collagenase   Topical Daily  . diltiazem  90 mg Oral Q6H  . feeding supplement (ENSURE ENLIVE)  237 mL Oral BID BM  . hydrocortisone   Rectal BID  . insulin aspart  0-5 Units Subcutaneous QHS  . insulin aspart  0-9 Units Subcutaneous TID WC  . lactulose  30 g Oral Daily  . metoprolol tartrate  25 mg Oral BID  . multivitamin with  minerals  1 tablet Oral Daily  . pantoprazole  40 mg Oral  Daily  . pneumococcal 23 valent vaccine  0.5 mL Intramuscular Tomorrow-1000  . sodium chloride flush  3 mL Intravenous Q12H  . spironolactone  100 mg Oral Daily  . torsemide  40 mg Oral Daily   Continuous Infusions:   LOS: 18 days     Georgette Shell, MD [facility] If 7PM-7AM, please contact night-coverage [email or web address] Password [email or web address] 03/22/2018, 1:46 PM

## 2018-03-22 NOTE — Progress Notes (Signed)
The Heparin was about to be started but the PIV came out the patient's arm. RN attempted to restart PIV but was unsuccessful.  IV team consult was notified for a PIV restart.

## 2018-03-22 NOTE — Progress Notes (Signed)
Patient's BP at 2136 96/64 MAP 73 soft; therefore, held PO metoprolol. Patient's BP at 2336 97/56 MAP 68. Paged Baltazar Najjar. She stated not to give 90 mg PO cardizem but instead to give a one time dose 30mg  PO cardizem.

## 2018-03-22 NOTE — Progress Notes (Signed)
[facility] called [employer] to inquire as to status of SNF authorization request. [employer] states still pending. [facility] reports having long term care bed opening which pt can move to after rehabilitation. CSW informed family.  Sharren Bridge, MSW, LCSW Clinical Social Work 03/22/2018 [phone]

## 2018-03-22 NOTE — Consult Note (Signed)
Reason for Consult: Sacral decubitus Referring Physician: Dr. Jacki Cones PCP:  Nicoletta Dress, MD  Latasha Guzman is an 73 y.o. female.    HPI: Patient is a 73 year old female transferring from [facility] to [facility] 03/04/18.  Chief complaint was new shortness of breath.  She was found to have a hemoglobin of 6.7; down from 9.2 with melanotic stools while on Eliquis for atrial fibrillation.  Upon admission she was seen by GI.  She has a history of diverticulosis, chronic anemia requiring multiple transfusions and EPO in the past.  She has EGDs from [facility] in June 2019 and colonoscopy September 2000 19 capsule endoscopy 12/2017.  She was seen by Dr. Silvano Rusk at that point she had multiple reasons for the decrease in her hemoglobin.  There was also concern that she might have cirrhosis due to right heart failure. She was admitted to [facility] by the Medicine service.  Diagnoses included acute GI bleed/acute blood loss anemia, acute diastolic congestive heart failure type 2 diabetes, hypertension with mild hypotension on admission atrial fibrillation on anticoagulation with Eliquis and confusion.  She has been here for 18 days and is being readied for discharge to SNF.  She was seen 03/07/18 by wound care with 4 x 3.5 x 0.2 cmn pressure ulcer to sacrococcygeal region.  Today wound was seen by Wound care and now has an area 14 x 9 cm with a mid area ulcer measuring 9 x 6 cm.  Moderated drainage and exudate.  We are ask to see.  Past Medical History:  Diagnosis Date  . Acute blood loss anemia 08/02/2017  . Atrial fibrillation ([place])   . Chronic atrial fibrillation 10/15/2015  . Chronic diastolic CHF (congestive heart failure) ([place])   . Diabetes mellitus ([place]) 07/27/2017  . Edema   . Essential hypertension 10/15/2015  . GERD (gastroesophageal reflux disease) 07/27/2017  . GI bleed 07/24/2017  . Hyperlipidemia   . Pacemaker 10/15/2015  . Pneumonia of right  lower lobe due to infectious organism ([place]) 08/02/2017  . Skin rash 08/02/2017  . Type 2 diabetes mellitus without complication ([place]) 5/64/3329    Past Surgical History:  Procedure Laterality Date  . ABDOMINAL HYSTERECTOMY  1995   Total  . AV FISTULA REPAIR    . CATARACT EXTRACTION    . GIVENS CAPSULE STUDY N/A 12/30/2017   Procedure: GIVENS CAPSULE STUDY;  Surgeon: Ronnette Juniper, MD;  Location: [facility];  Service: Gastroenterology;  Laterality: N/A;  . PACEMAKER INSERTION  10/30/2015   By Dr. Agustin Cree    Family History  Problem Relation Age of Onset  . Stroke Mother   . Heart disease Mother   . Diabetes Mother   . Hypertension Mother   . Hyperlipidemia Mother   . Heart attack Father   . Heart disease Father   . Diabetes Maternal Grandfather   . Hypertension Maternal Grandfather   . Heart attack Paternal Grandfather   . Heart disease Paternal Grandfather     Social History:  reports that she has never smoked. She has never used smokeless tobacco. She reports that she does not drink alcohol or use drugs.  Allergies:  Allergies  Allergen Reactions  . Iodine Hives, Other (See Comments) and Shortness Of Breath    Throat swells  . Iodinated Diagnostic Agents   . Penicillins Hives    Has patient had a PCN reaction causing immediate rash, facial/tongue/throat swelling, SOB or lightheadedness with hypotension: Yes Has patient had a  PCN reaction causing severe rash involving mucus membranes or skin necrosis: No Has patient had a PCN reaction that required hospitalization: No Has patient had a PCN reaction occurring within the last 10 years: No If all of the above answers are "NO", then may proceed with Cephalosporin use.     Medications:  Prior to Admission:  Medications Prior to Admission  Medication Sig Dispense Refill Last Dose  . apixaban (ELIQUIS) 5 MG TABS tablet Take 5 mg by mouth 2 (two) times daily.    03/03/2018 at 8am  . atorvastatin (LIPITOR) 80 MG tablet Take  80 mg by mouth at bedtime.    Past Week at Unknown time  . cholecalciferol (VITAMIN D3) 25 MCG (1000 UT) tablet Take 1,000 Units by mouth daily.   03/03/2018 at Unknown time  . diltiazem (CARDIZEM) 90 MG tablet Take 1 tablet (90 mg total) by mouth every 6 (six) hours. 120 tablet 0 03/03/2018  . fenofibrate 160 MG tablet Take 160 mg by mouth every morning.    03/03/2018 at Unknown time  . furosemide (LASIX) 40 MG tablet Take 40 mg by mouth daily.   03/03/2018 at Unknown time  . [EXPIRED] guaiFENesin (MUCINEX) 600 MG 12 hr tablet Take 600 mg by mouth 2 (two) times daily.   03/03/2018 at Unknown time  . ipratropium-albuterol (DUONEB) 0.5-2.5 (3) MG/3ML SOLN Take 3 mLs by nebulization every 6 (six) hours. Pt also takes as needed   03/03/2018 at Unknown time  . metoprolol tartrate (LOPRESSOR) 25 MG tablet Take 12.5 mg by mouth daily.    03/03/2018 at 8am  . Nutritional Supplements (NUTRITIONAL SUPPLEMENT PLUS) LIQD Take 30 mLs by mouth daily. Med pass   03/03/2018 at Unknown time  . omeprazole (PRILOSEC) 20 MG capsule Take 20 mg by mouth every morning.    03/03/2018 at Unknown time  . polyethylene glycol (MIRALAX / GLYCOLAX) packet Take 17 g by mouth daily.   03/03/2018 at Unknown time  . PROAIR HFA 108 (90 Base) MCG/ACT inhaler Inhale 2 puffs into the lungs every 6 (six) hours as needed for wheezing or shortness of breath.    Past Month at Unknown time  . traMADol (ULTRAM) 50 MG tablet Take 50 mg by mouth every 8 (eight) hours. Pt also takes as needed every 4 hours.   03/03/2018 at Unknown time  . doxycycline (VIBRA-TABS) 100 MG tablet Take 1 tablet (100 mg total) by mouth 2 (two) times daily. (Patient not taking: Reported on 03/04/2018) 20 tablet 0 Not Taking at Unknown time   Continuous:  [id number] **OR** acetaminophen, albuterol, guaiFENesin-codeine, lip balm, ondansetron **OR** ondansetron (ZOFRAN) IV, traMADol, traZODone Anti-infectives (From admission, onward)   None      Results for orders  placed or performed during the hospital encounter of 03/04/18 (from the past 48 hour(s))  Glucose, capillary     Status: None   Collection Time: 03/20/18 12:30 PM  Result Value Ref Range   Glucose-Capillary 86 70 - 99 mg/dL  Glucose, capillary     Status: None   Collection Time: 03/20/18  5:28 PM  Result Value Ref Range   Glucose-Capillary 81 70 - 99 mg/dL  Glucose, capillary     Status: None   Collection Time: 03/21/18 12:18 AM  Result Value Ref Range   Glucose-Capillary 81 70 - 99 mg/dL  Magnesium     Status: None   Collection Time: 03/21/18  4:35 AM  Result Value Ref Range   Magnesium 1.9 1.7 - 2.4 mg/dL  Comment: Performed at [facility], [place] [address], [place], [place] [address]  Basic metabolic panel     Status: Abnormal   Collection Time: 03/21/18  4:35 AM  Result Value Ref Range   Sodium 134 (L) 135 - 145 mmol/L   Potassium 3.9 3.5 - 5.1 mmol/L   Chloride 96 (L) 98 - 111 mmol/L   CO2 29 22 - 32 mmol/L   Glucose, Bld 87 70 - 99 mg/dL   BUN 39 (H) 8 - 23 mg/dL   Creatinine, Ser 1.08 (H) 0.44 - 1.00 mg/dL   Calcium 8.9 8.9 - 10.3 mg/dL   GFR calc non Af Amer 52 (L) >60 mL/min   GFR calc Af Amer 60 (L) >60 mL/min   Anion gap 9 5 - 15    Comment: Performed at [facility], [place] [address] [place], [place] [address]  Hemoglobin and hematocrit, blood     Status: Abnormal   Collection Time: 03/21/18  4:35 AM  Result Value Ref Range   Hemoglobin 8.3 (L) 12.0 - 15.0 g/dL   HCT 27.4 (L) 36.0 - 46.0 %    Comment: Performed at [facility], [place] [address], [place], [place] [address]  Albumin     Status: Abnormal   Collection Time: 03/21/18  4:35 AM  Result Value Ref Range   Albumin 1.9 (L) 3.5 - 5.0 g/dL    Comment: Performed at [facility], [place] [address], [place], [place] [address]  Glucose, capillary     Status: None   Collection Time: 03/21/18  8:06 AM  Result Value Ref Range    Glucose-Capillary 70 70 - 99 mg/dL  Glucose, capillary     Status: None   Collection Time: 03/21/18 12:00 PM  Result Value Ref Range   Glucose-Capillary 87 70 - 99 mg/dL  Glucose, capillary     Status: Abnormal   Collection Time: 03/21/18  4:49 PM  Result Value Ref Range   Glucose-Capillary 117 (H) 70 - 99 mg/dL  Glucose, capillary     Status: Abnormal   Collection Time: 03/21/18  9:35 PM  Result Value Ref Range   Glucose-Capillary 153 (H) 70 - 99 mg/dL  Magnesium     Status: None   Collection Time: 03/22/18  4:58 AM  Result Value Ref Range   Magnesium 1.9 1.7 - 2.4 mg/dL    Comment: Performed at [facility], [place] [address], [place], [place] [address]  Basic metabolic panel     Status: Abnormal   Collection Time: 03/22/18  4:58 AM  Result Value Ref Range   Sodium 133 (L) 135 - 145 mmol/L   Potassium 3.8 3.5 - 5.1 mmol/L   Chloride 96 (L) 98 - 111 mmol/L   CO2 28 22 - 32 mmol/L   Glucose, Bld 91 70 - 99 mg/dL   BUN 41 (H) 8 - 23 mg/dL   Creatinine, Ser 1.21 (H) 0.44 - 1.00 mg/dL   Calcium 9.1 8.9 - 10.3 mg/dL   GFR calc non Af Amer 45 (L) >60 mL/min   GFR calc Af Amer 52 (L) >60 mL/min   Anion gap 9 5 - 15    Comment: Performed at [facility], [place] [address], [place], [place] [address]  Hemoglobin and hematocrit, blood     Status: Abnormal   Collection Time: 03/22/18  4:58 AM  Result Value Ref Range   Hemoglobin 8.3 (L) 12.0 - 15.0 g/dL  HCT 27.3 (L) 36.0 - 46.0 %    Comment: Performed at [facility], [place] [address], [place], [place] [address]  Glucose, capillary     Status: None   Collection Time: 03/22/18  8:05 AM  Result Value Ref Range   Glucose-Capillary 95 70 - 99 mg/dL    No results found.  Review of Systems  Constitutional: Negative.   HENT: Negative.   Eyes: Negative.   Respiratory: Positive for shortness of breath. Negative for cough, hemoptysis, sputum production and wheezing.   Cardiovascular:  Positive for orthopnea and leg swelling. Negative for chest pain, palpitations and PND.  Gastrointestinal: Negative.   Genitourinary: Negative.   Musculoskeletal:       She says her legs just won't support her so she is mostly bedbound.  Skin:       See picture of wound  Neurological: Negative.   Endo/Heme/Allergies: Bruises/bleeds easily.  Psychiatric/Behavioral: Negative.    Blood pressure (!) 91/56, pulse (!) 102, temperature 98 F (36.7 C), temperature source Oral, resp. rate 14, height 5\' 7"  (1.702 m), weight 129.3 kg, SpO2 92 %. Physical Exam  Constitutional: She is oriented to person, place, and time. No distress.  Elderly WF, bed bound in no distress.  HENT:  Head: Normocephalic and atraumatic.  Mouth/Throat: Oropharynx is clear and moist.  Eyes: Right eye exhibits no discharge. Left eye exhibits no discharge. No scleral icterus.  Pupils are equal  Neck: Normal range of motion. Neck supple. No JVD present. No tracheal deviation present. No thyromegaly present.  Cardiovascular: Normal rate, regular rhythm, normal heart sounds and intact distal pulses.  Respiratory: Effort normal and breath sounds normal. No respiratory distress. She has no wheezes. She has no rales. She exhibits no tenderness.  GI: Soft. Bowel sounds are normal. She exhibits no distension and no mass. There is no abdominal tenderness. There is no rebound and no guarding.  Musculoskeletal:        General: Tenderness (to palpation) and edema (more in her feet than lower legs) present.  Lymphadenopathy:    She has no cervical adenopathy.  Neurological: She is alert and oriented to person, place, and time. No cranial nerve deficit.  Skin: Skin is warm and dry. No rash noted. She is not diaphoretic. No erythema. No pallor.  She has an area on there thigh that is also very tender/dry  Psychiatric: She has a normal mood and affect. Her behavior is normal. Judgment and thought content normal.       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  That top layer of dead skin needs to be debrided.  I would hold the Eliquis, we will get a I&D tray to the bedside in AM and take off what is already dead, I doubt she has any feeling in this dead tissue.  We can start better dressing changes and let the Eliquis start to wear off.  She can be on heparin if you need to keep her fully anticoagulated.  If she is on heparin we can wean her off pre op if we need to do more in the OR.  I  would talk to ID about abx.  With her deconditioning and inability to even turn on her own I doubt we will get this to heal.    We can get it cleaned up some.     Albin Duckett 03/22/2018, 11:13 AM

## 2018-03-22 NOTE — Consult Note (Addendum)
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 Last seen by my partner, K. Sanders 2 weeks ago on 1/141/20. Measurement:  14cm x 9cm total affected area with ulcer measuring 9cm x 6cm.  Soft tan eschar in center measures 6cm x 4cm and is pulling away from structures at the most distal end of wound , revealing a 1.5cm x 1.8cm defect at 5 o'clock with 1.5cm depth. Wound bed: As described above Drainage (amount, consistency, odor) Moderate amount of serosanguinous exudate consistent with debriding necrotic tissue (via autolysis and enzymatic debridement) Periwound: erythema, blanchable Dressing procedure/placement/frequency: Currently a silicone foam sacral dressing is in place and daily application of collagenase ointment is being applied ot the necrotic tissue topped with NS gauze dressings. Patient continues on a mattress replacement with low air loss feature, is wearing bilateral pressure redistribution heel boots and has silicone foam dressings to other areas of skin injury at the right great toe right hip and to skin tears on her arms.  At this time, I think it appropriate to involve CCS for (hopefully bedside) surgical debridement of the sacral wound. Following this and at their agreement, hydrotherapy (pulsatile lavage) with serial conservative sharp wound debridement by PT will likely be indicated.  If you agree, please consult CCS.  [facility] nursing team will not follow routinely, but will remain available to this patient, the nursing and medical teams. If PT is ordered for hydrotherapy, please notify [facility] and we will follow weekly.   Thanks, Maudie Flakes, MSN, RN, [place], Arther Abbott  Pager# [phone]

## 2018-03-22 NOTE — Progress Notes (Deleted)
KUB back without instruction on how far to further NG tube. Paged Baltazar Najjar. Instructed to insert 4cm further and do a repeat KUB.

## 2018-03-23 LAB — HEPARIN LEVEL (UNFRACTIONATED): Heparin Unfractionated: >2.2 IU/mL — ABNORMAL HIGH (ref 0.30–0.70)

## 2018-03-23 LAB — CBC
HCT: 24 % — ABNORMAL LOW (ref 36.0–46.0)
Hemoglobin: 7.5 g/dL — ABNORMAL LOW (ref 12.0–15.0)
MCH: 29.5 pg (ref 26.0–34.0)
MCHC: 31.3 g/dL (ref 30.0–36.0)
MCV: 94.5 fL (ref 80.0–100.0)
NRBC: 0 % (ref 0.0–0.2)
Platelets: 226 10*3/uL (ref 150–400)
RBC: 2.54 MIL/uL — ABNORMAL LOW (ref 3.87–5.11)
RDW: 19 % — ABNORMAL HIGH (ref 11.5–15.5)
WBC: 9 10*3/uL (ref 4.0–10.5)

## 2018-03-23 LAB — GLUCOSE, CAPILLARY
GLUCOSE-CAPILLARY: 100 mg/dL — AB (ref 70–99)
Glucose-Capillary: 82 mg/dL (ref 70–99)
Glucose-Capillary: 80 mg/dL (ref 70–99)
Glucose-Capillary: 94 mg/dL (ref 70–99)

## 2018-03-23 LAB — PREPARE RBC (CROSSMATCH)

## 2018-03-23 LAB — BASIC METABOLIC PANEL
Anion gap: 10 (ref 5–15)
BUN: 47 mg/dL — AB (ref 8–23)
CO2: 27 mmol/L (ref 22–32)
Calcium: 8.9 mg/dL (ref 8.9–10.3)
Chloride: 96 mmol/L — ABNORMAL LOW (ref 98–111)
Creatinine, Ser: 1.31 mg/dL — ABNORMAL HIGH (ref 0.44–1.00)
GFR calc Af Amer: 47 mL/min — ABNORMAL LOW (ref >60)
GFR calc non Af Amer: 41 mL/min — ABNORMAL LOW (ref >60)
Glucose, Bld: 89 mg/dL (ref 70–99)
Potassium: 3.9 mmol/L (ref 3.5–5.1)
Sodium: 133 mmol/L — ABNORMAL LOW (ref 135–145)

## 2018-03-23 LAB — APTT: aPTT: 61 seconds — ABNORMAL HIGH (ref 24–36)

## 2018-03-23 MED ORDER — TORSEMIDE 20 MG PO TABS
20.0000 mg | ORAL_TABLET | Freq: Every day | ORAL | Status: DC
  Administered 2018-03-24 – 2018-03-27 (×4): 20 mg via ORAL
  Filled 2018-03-23 (×4): qty 1

## 2018-03-23 MED ORDER — HEPARIN (PORCINE) 25000 UT/250ML-% IV SOLN
1100.0000 [IU]/h | INTRAVENOUS | Status: DC
  Administered 2018-03-23: 1100 [IU]/h via INTRAVENOUS

## 2018-03-23 MED ORDER — SODIUM CHLORIDE 0.9% IV SOLUTION: Freq: Once | INTRAVENOUS | Status: DC

## 2018-03-23 MED ORDER — APIXABAN 5 MG PO TABS
5.0000 mg | ORAL_TABLET | Freq: Two times a day (BID) | ORAL | Status: DC
  Administered 2018-03-23 – 2018-03-27 (×9): 5 mg via ORAL
  Filled 2018-03-23 (×9): qty 1

## 2018-03-23 MED ORDER — METOPROLOL TARTRATE 25 MG PO TABS
12.5000 mg | ORAL_TABLET | Freq: Two times a day (BID) | ORAL | Status: DC
  Administered 2018-03-24 – 2018-03-27 (×6): 12.5 mg via ORAL
  Filled 2018-03-23 (×6): qty 1

## 2018-03-23 MED ORDER — DILTIAZEM HCL 60 MG PO TABS
60.0000 mg | ORAL_TABLET | Freq: Four times a day (QID) | ORAL | Status: DC
  Administered 2018-03-24 – 2018-03-27 (×12): 60 mg via ORAL
  Filled 2018-03-23 (×12): qty 1

## 2018-03-23 NOTE — Progress Notes (Signed)
Patient dressing changed x2 on shift since bedside surgical debridement.  Wound discharge is bloody and saturating the dressings and bed linens.  Skin inside of wound is adhering to gauze, though saturated.  Blood is seen pooling inside of wound.  Dr. Rodena Piety notified.  Wound packed with gauze with ABD pads on top.  Patient has refused to turn all day.  Educated patient regarding wound healing, prevention of wounds, and repositioning.  Patient agreeable to teaching but still refusing.  Needs reinforcement.  Encouraged patient to turn often throughout the day but to no avail.  Night RN to hold eliquis per Dr. Rodena Piety.  Patient BP soft throughout shift, BP meds and diuretics held; Dr. Rodena Piety aware.

## 2018-03-23 NOTE — Procedures (Addendum)
Debridement Procedure Note  Indications: The patient is a 74 y.o. female with an unstageable sacral wound. I recommended debridement of dead tissue followed by good wound care.  Pre-operative Diagnosis: Sacral wound  Post-operative Diagnosis: Same  Procedure Details  The risks, benefits, complications, treatment options, and expected outcomes were discussed with the patient. The patient agreed to proceed.   Soft tan eschar about 6x4cm was debrided from the wound. There was some bleeding from the wound, after holding pressure hemostasis was achieved. The wound was packed with saline dampened kerlex and secured with a dry ABD pad and tape. Wound measures about 10 x 6 x 2 cm.  Complications:  None; patient tolerated the procedure well.              Plan: Will ask PT to see to start hydrotherapy. Continue BID dressing changes with santyl, saline dampened kerlex, and dry ABD pad.   Wellington Hampshire, [facility] Surgery 03/23/2018, 9:36 AM Pager: [phone] Mon 7:00 am -11:30 AM Tues-Fri 7:00 am-4:30 pm Sat-Sun 7:00 am-11:30 am

## 2018-03-23 NOTE — Progress Notes (Signed)
PT Cancellation Note  Patient Details Name: JOSCELIN FRAY MRN: [id number] DOB: 04/28/1945   Cancelled Treatment:     cancel mobility PT today due to sacral debridement today.     Rica Koyanagi  PTA Acute  Rehabilitation Services Pager      [phone] Office      [phone]

## 2018-03-23 NOTE — Progress Notes (Signed)
CSW assisting with disposition- have been planning to have pt to admit to [facility] for rehab with potential of remaining there for long term care afterward (depending on her progress and bed availability). Pt received approved [facility] auth for this today, however, pt noted to be starting hydrotherapy for sacral wound today. Wound care needs may impact DC plan. Will follow to continue planning.  Updated pt's niece and [place].   Sharren Bridge, MSW, LCSW Clinical Social Work 03/23/2018 [phone]

## 2018-03-23 NOTE — Progress Notes (Signed)
BP 90/55  Pulse 105.  Message sent to Dr. Silas [place] to see if he wants med held.  Received call back from Dr. Silas [place] states to hold medications for now.

## 2018-03-23 NOTE — Progress Notes (Signed)
PROGRESS NOTE    [place]  DPO:[id number] DOB: [birth year] DOA: 03/04/2018 PCP: Nicoletta Dress, MD  Brief Narrative:73 year old female with history of chronic A. Fib with pacemaker, DM 2, hypertension, diastolic CHF and lymphedema transferred from [facility] due to symptomatic anemia in the setting of GI bleed. Hemoglobin 6.3 on arrival here. Chest x-ray with bilateral interstitial infiltrate with bilateral pleural effusion and vascular congestion. EKG with atrial fibrillation with RVR to 123 and PVCs. She was transfused 2 units of blood with appropriate response. IMA globin remained stable between 8 and 9. Per previous provider, GI consulted and recommended observation as hemoglobin remained stable. Apixaban resumed. Hemoglobin slowly trended down to 7.8. Transfused 1 unit of blood appropriate response.  Patient was aggressively diuresed with IV lasix 80 mg 3 times daily for fluid overload and pulmonary vascular congestion. On 1/23, abdominal pain concerning for ascites. No history of cirrhosis, hepatitis or EtOH use per patient or chart review. Ammonia on the upper side of normal at 39. Limited abdominal ultrasound showed moderate ascites in all quadrants. Had paracentesis with removal of 2 L on 1/24. Abdominal exam, fluid cytology and culture not consistent with SBP. SAAG greater than 1.1 suggestive for some portal hypertension. RUQ ultrasound obtained and confirmed liver cirrhosis with patent portal vein and normal blood flow.   On 1/25 patient was transitioned to torsemide 80 mg daily. Also started on Aldactone 50 mg daily and lactulose.   On 1/27, torsemide reduced further to 40 mg daily. Aldactone increased to 100 mg daily  Assessment & Plan:   Principal Problem:   GI bleed Active Problems:   Acute blood loss anemia   Chronic atrial fibrillation   Diabetes mellitus (HCC)   Essential hypertension   GERD (gastroesophageal reflux disease)   Acute diastolic CHF  (congestive heart failure) (HCC)   Acute GI bleeding   Pressure injury of skin   Shortness of breath   Hematochezia   Moderate malnutrition (HCC)  Deep tissue pressure injury/unstageable pressure ulcer reviewed [facility] notes.  She had surgery input status post debridement today.  03/23/2018.  Acute on chronic diastolic CHF exacerbation/pulmonary hypertension: Echo with EF of 55 to 60%, no wall motion abnormalities but some diastolic flattening, severe LAE and PA PP of 64. Chest x-ray with pulmonary congestion and pleural effusion on admission. Still with significant lymphedema and dependent edema which is likely a combination of CHF, cirrhosis and hypoalbuminemia. Fair response to IV Lasix 80 mg 3 times daily. Transitioned to oral torsemide 80 mg daily with Aldactone 50 mg daily on 1/25, then torsemide40 mg and Aldactone to 100 mg on 1/27.   Patient really was not able to tolerate the torsemide Aldactone beta-blocker and Cardizem due to soft blood pressure.  Her doses were adjusted due to soft BP.  -Liver cirrhosis/ascites: noted on abdominal ultrasound although patient denies history of liver disease, hepatitis or EtOH. No signs of SBP. Albumin low. Ammonia 39. Paracentesis yielded 2 L on 1/24. Cytology and culture not consistent with SBP. No encephalopathy. -Continue torsemide, Aldactone and lactulose as above.  Dependent edema/lymphedema: Improved -Manage as above  Hypokalemia/hypomagnesemia: Resolved -Monitor and replenish   Atrial fibrillation with RVR: RVR resolved. -Continue beta-blocker, CCB and Eliquis.  CKD 3: Slight uptrend -Continue monitoring  DM2: -CBG monitoring -SSI  Bladder outlet obstruction: urinary obstruction noted on bladder scan.Foley catheter placed on admission. Failed voiding trial 1/26. Passed voiding trial with Urecholine 1/27.   Pressure Injury 03/04/18 Deep Tissue Injury - Purple or maroon  localized area of discolored intact skin or  blood-filled blister due to damage of underlying soft tissue from pressure and/or shear. (Active)  03/04/18 0500  Location: Sacrum  Location Orientation: Mid  Staging: Deep Tissue Injury - Purple or maroon localized area of discolored intact skin or blood-filled blister due to damage of underlying soft tissue from pressure and/or shear.  Wound Description (Comments):   Present on Admission: Yes     Pressure Injury 03/06/18 Stage III -  Full thickness tissue loss. Subcutaneous fat may be visible but bone, tendon or muscle are NOT exposed. (Active)  03/06/18 0721  Location: Sacrum  Location Orientation: Mid  Staging: Stage III -  Full thickness tissue loss. Subcutaneous fat may be visible but bone, tendon or muscle are NOT exposed.  Wound Description (Comments):   Present on Admission: Yes      Nutrition Problem: Moderate Malnutrition Etiology: chronic illness(CHF)     Signs/Symptoms: mild fat depletion, severe muscle depletion, edema    Interventions: Ensure Enlive (each supplement provides 350kcal and 20 grams of protein), MVI  Estimated body mass index is 31.01 kg/m as calculated from the following:   Height as of this encounter: 5\' 7"  (1.702 m).   Weight as of this encounter: 89.8 kg.  DVT prophylaxis: Eliquis Code Status: Full code Family Communication: None Disposition Plan: Pending finding SNF that would take patients with hydrotherapy  Consultants: General surgery  Procedures: Debridement 03/23/2018 Antimicrobials: None  Subjective: Resting in bed awake alert answers all questions appropriately  Objective: Vitals:   03/23/18 0500 03/23/18 0542 03/23/18 0854 03/23/18 1200  BP:  (!) 103/59 91/61 (!) 95/54  Pulse:  90 73 81  Resp:  18 18   Temp:  97.9 F (36.6 C) 98.6 F (37 C) 98 F (36.7 C)  TempSrc:  Oral Oral   SpO2:  95% 95% 92%  Weight: 89.8 kg     Height:        Intake/Output Summary (Last 24 hours) at 03/23/2018 1242 Last data filed at  03/23/2018 1049 Gross per 24 hour  Intake 1013.88 ml  Output 500 ml  Net 513.88 ml   Filed Weights   03/22/18 0448 03/22/18 2053 03/23/18 0500  Weight: 129.3 kg 88.5 kg 89.8 kg    Examination:  General exam: Appears calm and comfortable  Respiratory system: Clear to auscultation. Respiratory effort normal. Cardiovascular system: S1 & S2 heard, RRR. No JVD, murmurs, rubs, gallops or clicks. No pedal edema. Gastrointestinal system: Abdomen is nondistended, soft and nontender. No organomegaly or masses felt. Normal bowel sounds heard. Central nervous system: Alert and oriented. No focal neurological deficits. Extremities: 1+ pitting edema. Skin: No rashes, lesions or ulcers Psychiatry: Judgement and insight appear normal. Mood & affect appropriate.     Data Reviewed: I have personally reviewed following labs and imaging studies  CBC: Recent Labs  Lab 03/17/18 0408  03/20/18 0432 03/21/18 0435 03/22/18 0458 03/22/18 1757 03/23/18 0435  WBC 13.2*  --   --   --   --  9.3 9.0  HGB 9.0*   < > 8.9* 8.3* 8.3* 7.7* 7.5*  HCT 29.4*   < > 29.2* 27.4* 27.3* 25.0* 24.0*  MCV 93.0  --   --   --   --  92.9 94.5  PLT 319  --   --   --   --  236 226   < > = values in this interval not displayed.   Basic Metabolic Panel: Recent Labs  Lab 03/18/18  2355 03/19/18 0429 03/20/18 0432 03/21/18 0435 03/22/18 0458 03/23/18 0435  NA 136 137 134* 134* 133* 133*  K 3.1* 3.6 3.6 3.9 3.8 3.9  CL 98 101 97* 96* 96* 96*  CO2 30 29 29 29 28 27   GLUCOSE 94 92 91 87 91 89  BUN 44* 38* 37* 39* 41* 47*  CREATININE 1.04* 0.91 1.04* 1.08* 1.21* 1.31*  CALCIUM 8.9 8.8* 8.9 8.9 9.1 8.9  MG 2.0 2.0 1.9 1.9 1.9  --   PHOS 2.4*  --   --   --   --   --    GFR: Estimated Creatinine Clearance: 45.3 mL/min (A) (by C-G formula based on SCr of 1.31 mg/dL (H)). Liver Function Tests: Recent Labs  Lab 03/18/18 0446 03/19/18 0429 03/21/18 0435  AST  --  17  --   ALT  --  6  --   ALKPHOS  --  53  --     BILITOT  --  2.1*  --   PROT  --  5.3*  --   ALBUMIN 2.2* 2.0* 1.9*   No results for input(s): LIPASE, AMYLASE in the last 168 hours. Recent Labs  Lab 03/17/18 1208 03/20/18 0432  AMMONIA 39* 29   Coagulation Profile: Recent Labs  Lab 03/22/18 1757  INR 2.72   Cardiac Enzymes: No results for input(s): CKTOTAL, CKMB, CKMBINDEX, TROPONINI in the last 168 hours. BNP (last 3 results) No results for input(s): PROBNP in the last 8760 hours. HbA1C: No results for input(s): HGBA1C in the last 72 hours. CBG: Recent Labs  Lab 03/22/18 1231 03/22/18 1604 03/22/18 2026 03/23/18 0724 03/23/18 1146  GLUCAP 93 84 90 82 100*   Lipid Profile: No results for input(s): CHOL, HDL, LDLCALC, TRIG, CHOLHDL, LDLDIRECT in the last 72 hours. Thyroid Function Tests: No results for input(s): TSH, T4TOTAL, FREET4, T3FREE, THYROIDAB in the last 72 hours. Anemia Panel: No results for input(s): VITAMINB12, FOLATE, FERRITIN, TIBC, IRON, RETICCTPCT in the last 72 hours. Sepsis Labs: No results for input(s): PROCALCITON, LATICACIDVEN in the last 168 hours.  Recent Results (from the past 240 hour(s))  Body fluid culture     Status: None   Collection Time: 03/17/18 11:53 AM  Result Value Ref Range Status   Specimen Description   Final    PARACENTESIS PERITONEAL Performed at [place] [address] [place], [place] [address]    Special Requests   Final    NONE Performed at [facility], [place] [address], [place], [place] [address]    Gram Stain   Final    WBC PRESENT, PREDOMINANTLY PMN NO ORGANISMS SEEN CYTOSPIN SMEAR    Culture   Final    NO GROWTH 3 DAYS Performed at [facility], [place] [address], [place], [place] [address]    Report Status 03/20/2018 FINAL  Final         Radiology Studies: No results found.      Scheduled Meds: . sodium chloride   Intravenous Once  . sodium chloride   Intravenous Once  . apixaban  5 mg Oral  BID  . bethanechol  10 mg Oral TID  . collagenase   Topical Daily  . diltiazem  60 mg Oral Q6H  . feeding supplement (ENSURE ENLIVE)  237 mL Oral BID BM  . hydrocortisone   Rectal BID  . insulin aspart  0-5 Units Subcutaneous QHS  . insulin aspart  0-9 Units Subcutaneous TID WC  . lactulose  30 g Oral Daily  . metoprolol tartrate  12.5 mg Oral BID  . multivitamin with minerals  1 tablet Oral Daily  . pantoprazole  40 mg Oral Daily  . pneumococcal 23 valent vaccine  0.5 mL Intramuscular Tomorrow-1000  . sodium chloride flush  3 mL Intravenous Q12H  . [START ON 03/24/2018] torsemide  20 mg Oral Daily   Continuous Infusions:   LOS: 19 days     Georgette Shell, MD  If 7PM-7AM, please contact night-coverage [email or web address] Password [email or web address] 03/23/2018, 12:42 PM

## 2018-03-23 NOTE — Progress Notes (Signed)
ANTICOAGULATION CONSULT NOTE - Follow Up Consult  Pharmacy Consult for Heparin (holding apixaban) Indication: atrial fibrillation  Allergies  Allergen Reactions  . Iodine Hives, Other (See Comments) and Shortness Of Breath    Throat swells  . Iodinated Diagnostic Agents   . Penicillins Hives    Has patient had a PCN reaction causing immediate rash, facial/tongue/throat swelling, SOB or lightheadedness with hypotension: Yes Has patient had a PCN reaction causing severe rash involving mucus membranes or skin necrosis: No Has patient had a PCN reaction that required hospitalization: No Has patient had a PCN reaction occurring within the last 10 years: No If all of the above answers are "NO", then may proceed with Cephalosporin use.     Patient Measurements: Height: 5\' 7"  (170.2 cm) Weight: 195 lb (88.5 kg) IBW/kg (Calculated) : 61.6 Heparin Dosing Weight:   Vital Signs: Temp: 98 F (36.7 C) (01/29 2029) Temp Source: Oral (01/29 2029) BP: 104/64 (01/29 2303) Pulse Rate: 107 (01/29 2303)  Labs: Recent Labs    03/21/18 0435 03/22/18 0458 03/22/18 1757 03/23/18 0435  HGB 8.3* 8.3* 7.7* 7.5*  HCT 27.4* 27.3* 25.0* 24.0*  PLT  --   --  236 226  APTT  --   --  54* 61*  LABPROT  --   --  28.5*  --   INR  --   --  2.72  --   HEPARINUNFRC  --   --  >2.20*  --   CREATININE 1.08* 1.21*  --   --     Estimated Creatinine Clearance: 48.7 mL/min (A) (by C-G formula based on SCr of 1.21 mg/dL (H)).   Medications:  Infusions:  . heparin      Assessment: PTT low for heparin drip.  No heparin issues noted.    Goal of Therapy:  Heparin level 0.3-0.7 units/ml aPTT 66-102 seconds Monitor platelets by anticoagulation protocol: Yes   Plan:  Increase heparin to 1100 units/hr Recheck PTT and Heparin level at 1400  [address] [place] 03/23/2018,5:23 AM

## 2018-03-24 LAB — BASIC METABOLIC PANEL
Anion gap: 8 (ref 5–15)
BUN: 51 mg/dL — ABNORMAL HIGH (ref 8–23)
CHLORIDE: 96 mmol/L — AB (ref 98–111)
CO2: 28 mmol/L (ref 22–32)
Calcium: 8.7 mg/dL — ABNORMAL LOW (ref 8.9–10.3)
Creatinine, Ser: 1.36 mg/dL — ABNORMAL HIGH (ref 0.44–1.00)
GFR calc Af Amer: 45 mL/min — ABNORMAL LOW (ref >60)
GFR calc non Af Amer: 39 mL/min — ABNORMAL LOW (ref >60)
Glucose, Bld: 83 mg/dL (ref 70–99)
POTASSIUM: 3.9 mmol/L (ref 3.5–5.1)
Sodium: 132 mmol/L — ABNORMAL LOW (ref 135–145)

## 2018-03-24 LAB — GLUCOSE, CAPILLARY
Glucose-Capillary: 77 mg/dL (ref 70–99)
Glucose-Capillary: 80 mg/dL (ref 70–99)
Glucose-Capillary: 70 mg/dL (ref 70–99)
Glucose-Capillary: 66 mg/dL — ABNORMAL LOW (ref 70–99)
Glucose-Capillary: 67 mg/dL — ABNORMAL LOW (ref 70–99)
Glucose-Capillary: 76 mg/dL (ref 70–99)

## 2018-03-24 LAB — TYPE AND SCREEN
ABO/RH(D): O POS
Antibody Screen: NEGATIVE
Donor AG Type: NEGATIVE
Unit division: 0

## 2018-03-24 LAB — BPAM RBC
Blood Product Expiration Date: 202002252359
ISSUE DATE / TIME: 202001302009
Unit Type and Rh: 5100

## 2018-03-24 LAB — CBC
HEMATOCRIT: 27.3 % — AB (ref 36.0–46.0)
Hemoglobin: 8.4 g/dL — ABNORMAL LOW (ref 12.0–15.0)
MCH: 28.1 pg (ref 26.0–34.0)
MCHC: 30.8 g/dL (ref 30.0–36.0)
MCV: 91.3 fL (ref 80.0–100.0)
Platelets: 258 10*3/uL (ref 150–400)
RBC: 2.99 MIL/uL — ABNORMAL LOW (ref 3.87–5.11)
RDW: 18.1 % — ABNORMAL HIGH (ref 11.5–15.5)
WBC: 8.5 10*3/uL (ref 4.0–10.5)
nRBC: 0 % (ref 0.0–0.2)

## 2018-03-24 MED ORDER — HYDROMORPHONE HCL 1 MG/ML IJ SOLN
0.5000 mg | Freq: Two times a day (BID) | INTRAMUSCULAR | Status: DC | PRN
  Administered 2018-03-24 – 2018-03-27 (×5): 0.5 mg via INTRAVENOUS
  Filled 2018-03-24 (×5): qty 0.5

## 2018-03-24 NOTE — Progress Notes (Signed)
Pt's  BP 99/71 HR 102  Pt has scheduled dose of cardizem due.  Dr. Silas [place] notified.  Nurse instructed to hold this a.m dose.

## 2018-03-24 NOTE — Progress Notes (Signed)
PROGRESS NOTE    Latasha Guzman  BEM:[id number] DOB: March 04, 1945 DOA: 03/04/2018 PCP: Nicoletta Dress, MD  Brief Narrative: 73 year old female with history of chronic A. Fib with pacemaker, DM 2, hypertension, diastolic CHF and lymphedema transferred from [facility] due to symptomatic anemia in the setting of GI bleed. Hemoglobin 6.3 on arrival here. Chest x-ray with bilateral interstitial infiltrate with bilateral pleural effusion and vascular congestion. EKG with atrial fibrillation with RVR to 123 and PVCs. She was transfused 2 units of blood with appropriate response. IMA globin remained stable between 8 and 9. Per previous provider, GI consulted and recommended observation as hemoglobin remained stable. Apixaban resumed. Hemoglobin slowly trended down to 7.8. Transfused 1 unit of blood appropriate response.  Patient was aggressively diuresed with IV lasix 80 mg 3 times daily for fluid overload and pulmonary vascular congestion. On 1/23, abdominal pain concerning for ascites. No history of cirrhosis, hepatitis or EtOH use per patient or chart review. Ammonia on the upper side of normal at 39. Limited abdominal ultrasound showed moderate ascites in all quadrants. Had paracentesis with removal of 2 L on 1/24. Abdominal exam, fluid cytology and culture not consistent with SBP. SAAG greater than 1.1 suggestive for some portal hypertension. RUQ ultrasound obtained and confirmed liver cirrhosis with patent portal vein and normal blood flow.  Assessment & Plan:   Principal Problem:   GI bleed Active Problems:   Acute blood loss anemia   Chronic atrial fibrillation   Diabetes mellitus (HCC)   Essential hypertension   GERD (gastroesophageal reflux disease)   Acute diastolic CHF (congestive heart failure) (HCC)   Acute GI bleeding   Pressure injury of skin   Shortness of breath   Hematochezia   Moderate malnutrition (HCC)  Deep tissue pressure injury/unstageable pressure ulcer  -status post bedside debridement 03/23/2018.  Continue PT hydrotherapy.  SW looking for SNF which accepts hydrotherapy.   Acute on chronic diastolic CHF exacerbation/pulmonary hypertension: Echo with EF of 55 to 60%, no wall motion abnormalities but some diastolic flattening, severe LAE and PA PP of 64. Chest x-ray with pulmonary congestion and pleural effusion on admission. Still with significant lymphedema and dependent edema which is likely a combination of CHF, cirrhosis and hypoalbuminemia. Fair response to IV Lasix 80 mg 3 times daily. Transitioned to oral torsemide 80 mg daily with Aldactone 50 mg daily on 1/25, then torsemide40 mg and Aldactone to 100 mg on 1/27.  Patient really was not able to tolerate the torsemide Aldactone beta-blocker and Cardizem due to soft blood pressure.  Her doses were adjusted due to soft BP.  -Newly diagnosed liver cirrhosis/ascites: noted on abdominal ultrasound although patient denies history of liver disease, hepatitis or EtOH. No signs of SBP. Albumin low. Ammonia 39. Paracentesis yielded 2 L on 1/24. Cytology and culture not consistent with SBP. No encephalopathy. -Continue torsemide, Aldactone and lactulose as above.  Dependent edema/lymphedema: Improved  Atrial fibrillation with RVR: RVR resolved. -Continue beta-blocker, CCB and Eliquis. -If no further surgery planned we can switch to Coumadin and DC Eliquis in view of her new onset cirrhosis.  CKD 3: Slight uptrend -Continue monitoring  DM2: -CBG monitoring -SSI  Bladder outlet obstruction: urinary obstruction noted on bladder scan.Foley catheter placed on admission. Failed voiding trial 1/26. Passed voiding trial with Urecholine 1/27.  Anemia of chronic disease received blood transfusion 1 unit 03/23/2018   Pressure Injury 03/04/18 Deep Tissue Injury - Purple or maroon localized area of discolored intact skin or blood-filled blister due to damage  of underlying soft tissue  from pressure and/or shear. (Active)  03/04/18 0500  Location: Sacrum  Location Orientation: Mid  Staging: Deep Tissue Injury - Purple or maroon localized area of discolored intact skin or blood-filled blister due to damage of underlying soft tissue from pressure and/or shear.  Wound Description (Comments):   Present on Admission: Yes     Pressure Injury 03/06/18 Stage III -  Full thickness tissue loss. Subcutaneous fat may be visible but bone, tendon or muscle are NOT exposed. (Active)  03/06/18 0721  Location: Sacrum  Location Orientation: Mid  Staging: Stage III -  Full thickness tissue loss. Subcutaneous fat may be visible but bone, tendon or muscle are NOT exposed.  Wound Description (Comments):   Present on Admission: Yes      Nutrition Problem: Moderate Malnutrition Etiology: chronic illness(CHF)     Signs/Symptoms: mild fat depletion, severe muscle depletion, edema    Interventions: Ensure Enlive (each supplement provides 350kcal and 20 grams of protein), MVI  Estimated body mass index is 31.01 kg/m as calculated from the following:   Height as of this encounter: 5\' 7"  (1.702 m).   Weight as of this encounter: 89.8 kg.  DVT prophylaxis: Eliquis Code Status: Full code discussed with patient Family Communication: None Disposition Plan:  Pending SNF that accepts patients with hydrotherapy  Consultants: General surgery  Procedures: Debridement 03/23/2018 Antimicrobials: none Subjective: Resting in bed reports that she is very exhausted as she did not sleep last night denies any nausea vomiting abdominal pain sacral pain. Objective: Vitals:   03/23/18 2031 03/23/18 2250 03/23/18 2347 03/24/18 0550  BP: 92/64 (!) 90/55 115/70 99/71  Pulse: (!) 106 (!) 105 (!) 106 (!) 102  Resp: 16  18 18   Temp: 98.3 F (36.8 C)  98.3 F (36.8 C) 97.7 F (36.5 C)  TempSrc: Oral  Oral Oral  SpO2: 96%  100% 97%  Weight:      Height:        Intake/Output Summary (Last 24  hours) at 03/24/2018 1218 Last data filed at 03/24/2018 0850 Gross per 24 hour  Intake 435 ml  Output 600 ml  Net -165 ml   Filed Weights   03/22/18 0448 03/22/18 2053 03/23/18 0500  Weight: 129.3 kg 88.5 kg 89.8 kg    Examination:  General exam: Appears calm and comfortable  Respiratory system: Clear to auscultation. Respiratory effort normal. Cardiovascular system: S1 & S2 heard, RRR. No JVD, murmurs, rubs, gallops or clicks. No pedal edema. Gastrointestinal system: Abdomen is nondistended, soft and nontender. No organomegaly or masses felt. Normal bowel sounds heard. Central nervous system: Alert and oriented. No focal neurological deficits. Extremities: 2+ edema. Skin: No rashes, lesions or ulcers Psychiatry: Judgement and insight appear normal. Mood & affect appropriate.     Data Reviewed: I have personally reviewed following labs and imaging studies  CBC: Recent Labs  Lab 03/21/18 0435 03/22/18 0458 03/22/18 1757 03/23/18 0435 03/24/18 0405  WBC  --   --  9.3 9.0 8.5  HGB 8.3* 8.3* 7.7* 7.5* 8.4*  HCT 27.4* 27.3* 25.0* 24.0* 27.3*  MCV  --   --  92.9 94.5 91.3  PLT  --   --  236 226 474   Basic Metabolic Panel: Recent Labs  Lab 03/18/18 0446 03/19/18 0429 03/20/18 0432 03/21/18 0435 03/22/18 0458 03/23/18 0435 03/24/18 0405  NA 136 137 134* 134* 133* 133* 132*  K 3.1* 3.6 3.6 3.9 3.8 3.9 3.9  CL 98 101 97* 96* 96*  96* 96*  CO2 30 29 29 29 28 27 28   GLUCOSE 94 92 91 87 91 89 83  BUN 44* 38* 37* 39* 41* 47* 51*  CREATININE 1.04* 0.91 1.04* 1.08* 1.21* 1.31* 1.36*  CALCIUM 8.9 8.8* 8.9 8.9 9.1 8.9 8.7*  MG 2.0 2.0 1.9 1.9 1.9  --   --   PHOS 2.4*  --   --   --   --   --   --    GFR: Estimated Creatinine Clearance: 43.7 mL/min (A) (by C-G formula based on SCr of 1.36 mg/dL (H)). Liver Function Tests: Recent Labs  Lab 03/18/18 0446 03/19/18 0429 03/21/18 0435  AST  --  17  --   ALT  --  6  --   ALKPHOS  --  53  --   BILITOT  --  2.1*  --   PROT   --  5.3*  --   ALBUMIN 2.2* 2.0* 1.9*   No results for input(s): LIPASE, AMYLASE in the last 168 hours. Recent Labs  Lab 03/20/18 0432  AMMONIA 29   Coagulation Profile: Recent Labs  Lab 03/22/18 1757  INR 2.72   Cardiac Enzymes: No results for input(s): CKTOTAL, CKMB, CKMBINDEX, TROPONINI in the last 168 hours. BNP (last 3 results) No results for input(s): PROBNP in the last 8760 hours. HbA1C: No results for input(s): HGBA1C in the last 72 hours. CBG: Recent Labs  Lab 03/23/18 1146 03/23/18 1721 03/23/18 2302 03/24/18 0815 03/24/18 1152  GLUCAP 100* 80 94 77 80   Lipid Profile: No results for input(s): CHOL, HDL, LDLCALC, TRIG, CHOLHDL, LDLDIRECT in the last 72 hours. Thyroid Function Tests: No results for input(s): TSH, T4TOTAL, FREET4, T3FREE, THYROIDAB in the last 72 hours. Anemia Panel: No results for input(s): VITAMINB12, FOLATE, FERRITIN, TIBC, IRON, RETICCTPCT in the last 72 hours. Sepsis Labs: No results for input(s): PROCALCITON, LATICACIDVEN in the last 168 hours.  Recent Results (from the past 240 hour(s))  Body fluid culture     Status: None   Collection Time: 03/17/18 11:53 AM  Result Value Ref Range Status   Specimen Description   Final    PARACENTESIS PERITONEAL Performed at [place] [address] [place], [place] [address]    Special Requests   Final    NONE Performed at [facility], [place] [address] [place], [place] [address]    Gram Stain   Final    WBC PRESENT, PREDOMINANTLY PMN NO ORGANISMS SEEN CYTOSPIN SMEAR    Culture   Final    NO GROWTH 3 DAYS Performed at [facility], [place] [address], [place],  [address]    Report Status 03/20/2018 FINAL  Final         Radiology Studies: No results found.      Scheduled Meds: . sodium chloride   Intravenous Once  . sodium chloride   Intravenous Once  . apixaban  5 mg Oral BID  . bethanechol  10 mg Oral TID  .  collagenase   Topical Daily  . diltiazem  60 mg Oral Q6H  . feeding supplement (ENSURE ENLIVE)  237 mL Oral BID BM  . hydrocortisone   Rectal BID  . insulin aspart  0-5 Units Subcutaneous QHS  . insulin aspart  0-9 Units Subcutaneous TID WC  . lactulose  30 g Oral Daily  . metoprolol tartrate  12.5 mg Oral BID  . multivitamin with minerals  1 tablet Oral Daily  .  pantoprazole  40 mg Oral Daily  . pneumococcal 23 valent vaccine  0.5 mL Intramuscular Tomorrow-1000  . sodium chloride flush  3 mL Intravenous Q12H  . torsemide  20 mg Oral Daily   Continuous Infusions:   LOS: 20 days        Georgette Shell, MD [facility]  If 7PM-7AM, please contact night-coverage [email or web address] Password [facility] 03/24/2018, 12:18 PM

## 2018-03-24 NOTE — Progress Notes (Signed)
[facility]/Trauma Progress Note      Assessment/Plan Sacral decub - continue hydrotherapy and BID dressing changes with santyl, saline dampened kerlex, and dry ABD pad  FEN: heart healthy VTE: SCD's, Eliquis ID: none Follow up: TBD   DISPO: this wound is already improved with bedside debridement and hydrotherapy. We will see again on Monday to re-evaluate but this may improve without need for OR.   LOS: 20 days    Subjective: CC: sacral wound  Sacral pain. No other complaints at this time. Already had hydrotherapy.  Objective: Vital signs in last 24 hours: Temp:  [97.7 F (36.5 C)-98.3 F (36.8 C)] 98.1 F (36.7 C) (01/31 1310) Pulse Rate:  [97-109] 98 (01/31 1310) Resp:  [16-18] 18 (01/31 1310) BP: (90-115)/(55-73) 103/73 (01/31 1310) SpO2:  [94 %-100 %] 98 % (01/31 1310) Last BM Date: 03/22/18  Intake/Output from previous day: 01/30 0701 - 01/31 0700 In: 565 [P.O.:250; Blood:315] Out: 600 [Urine:600] Intake/Output this shift: Total I/O In: 120 [P.O.:120] Out: -   PE: Gen:  Alert, NAD, pleasant, cooperative GU: sacral wound that is clean and without purulent drainage. See photo below Skin: warm and dry      Anti-infectives: Anti-infectives (From admission, onward)   None      Lab Results:  Recent Labs    03/23/18 0435 03/24/18 0405  WBC 9.0 8.5  HGB 7.5* 8.4*  HCT 24.0* 27.3*  PLT 226 258   BMET Recent Labs    03/23/18 0435 03/24/18 0405  NA 133* 132*  K 3.9 3.9  CL 96* 96*  CO2 27 28  GLUCOSE 89 83  BUN 47* 51*  CREATININE 1.31* 1.36*  CALCIUM 8.9 8.7*   PT/INR Recent Labs    03/22/18 1757  LABPROT 28.5*  INR 2.72   CMP     Component Value Date/Time   NA 132 (L) 03/24/2018 0405   K 3.9 03/24/2018 0405   CL 96 (L) 03/24/2018 0405   CO2 28 03/24/2018 0405   GLUCOSE 83 03/24/2018 0405   BUN 51 (H) 03/24/2018 0405   CREATININE 1.36 (H) 03/24/2018 0405   CALCIUM 8.7 (L) 03/24/2018 0405   PROT 5.3 (L)  03/19/2018 0429   ALBUMIN 1.9 (L) 03/21/2018 0435   AST 17 03/19/2018 0429   ALT 6 03/19/2018 0429   ALKPHOS 53 03/19/2018 0429   BILITOT 2.1 (H) 03/19/2018 0429   GFRNONAA 39 (L) 03/24/2018 0405   GFRAA 45 (L) 03/24/2018 0405   Lipase  No results found for: LIPASE  Studies/Results: No results found.    Kalman Drape , [facility] 03/24/2018, 3:21 PM  Pager: [phone] Mon-Wed, Friday 7:00am-4:30pm Thurs 7am-11:30am  Consults: [phone]

## 2018-03-24 NOTE — Progress Notes (Addendum)
Physical Therapy Wound Evaluation Patient Details  Name: Latasha Guzman MRN: [id number] Date of Birth: [birth year]  Today's Date: 03/24/2018 Time: 6606-3016 Time Calculation (min): 33 min  Subjective  Subjective: "I remember you."  Pt has been working with PT on mobility. Patient and Family Stated Goals: wound healing Prior Treatments: status post bedside debridement 03/23/2018  Pain Score: Pt denies pain during session, premedicated  Wound Assessment    03/24/18 1600  Subjective Assessment  Subjective "I remember you."  Pt has been working with PT on mobility.  Patient and Family Stated Goals wound healing  Prior Treatments status post bedside debridement 03/23/2018  Evaluation and Treatment  Evaluation and Treatment Procedures Explained to Patient/Family Yes  Evaluation and Treatment Procedures agreed to  Pressure Injury 03/24/18 Unstageable - Full thickness tissue loss in which the base of the ulcer is covered by slough (yellow, tan, gray, green or brown) and/or eschar (tan, brown or black) in the wound bed. HYDROTHERAPY  Date First Assessed/Time First Assessed: 03/24/18 1430   Location: Sacrum  Location Orientation: Mid  Staging: Unstageable - Full thickness tissue loss in which the base of the ulcer is covered by slough (yellow, tan, gray, green or brown) and/or esch...  Dressing Type Gauze (Comment);Foam;Moist to dry (santyl)  Dressing Changed  Dressing Change Frequency Twice a day  State of Healing Eschar  Site / Wound Assessment Bleeding;Red;Yellow  % Wound base Red or Granulating 50%  % Wound base Yellow/Fibrinous Exudate 50%  Peri-wound Assessment Erythema (blanchable);Pink  Wound Length (cm) 9 cm  Wound Width (cm) 6 cm  Wound Depth (cm) 3.5 cm (but still has nonviable tissue present )  Wound Surface Area (cm^2) 54 cm^2  Wound Volume (cm^3) 189 cm^3  Undermining (cm) 3cm at 12 oclock, 3 cm at 6 oclock, 2 cm at 9 oclock  Margins Unattached edges (unapproximated)   Drainage Amount Moderate  Drainage Description Green;Purulent  Treatment Debridement (Selective);Hydrotherapy (Pulse lavage);Packing (Saline gauze)  Hydrotherapy  Pulsed lavage therapy - wound location sacral pressure injury  Pulsed Lavage with Suction (psi) 8 psi  Pulsed Lavage with Suction - Normal Saline Used 1000 mL  Pulsed Lavage Tip Tip with splash shield  Selective Debridement  Selective Debridement - Location sacral pressure injury  Selective Debridement - Tools Used Forceps;Scissors  Selective Debridement - Tissue Removed yellow slough  Wound Therapy - Assess/Plan/Recommendations  Wound Therapy - Clinical Statement Pt with deep tissue pressure injury which evolved now into unstageable pressure injury. Pt s/p bedside debridement of sacral pressure injury on 03/23/18 and currently still with nonviable yellow slough present.  Pt with moderate bleeding during session so attempted to perform only on necrotic tissue as possible.  Pt would benefit from continued hydrotherapy to assist with removing nonviable tissue to promote wound healing.  Dressing did have purulent blue/green drainage today and RN notified.  Wound Therapy - Functional Problem List Pt wound benefit from hydrotherapy to assist with wound healing and educating on repositioning technique.  Factors Delaying/Impairing Wound Healing Diabetes Mellitus;Immobility;Multiple medical problems  Hydrotherapy Plan Debridement;Dressing change;Patient/family education;Pulsatile lavage with suction  Wound Therapy - Frequency 6X / week  Wound Therapy - Follow Up Recommendations Skilled nursing facility  Wound Plan Continue hydrotherapy as outlined above to promote wound healing  Wound Therapy Goals - Improve the function of patient's integumentary system by progressing the wound(s) through the phases of wound healing by:  Decrease Necrotic Tissue to 20  Decrease Necrotic Tissue - Progress Goal set today  Increase Granulation Tissue to  80   Increase Granulation Tissue - Progress Goal set today  Improve Drainage Characteristics Mod  Improve Drainage Characteristics - Progress Goal set today  Patient/Family will be able to  Pt will be able to state appropriate repositioning techniques.  Patient/Family Instruction Goal - Progress Progressing toward goal  Goals/treatment plan/discharge plan were made with and agreed upon by patient/family Yes  Time For Goal Achievement 2 weeks  Wound Therapy - Potential for Goals Fair                                                                                                                                                                                                                                                  Hydrotherapy Pulsed lavage therapy - wound location: sacral pressure injury Pulsed Lavage with Suction (psi): 8 psi Pulsed Lavage with Suction - Normal Saline Used: 1000 mL Pulsed Lavage Tip: Tip with splash shield Selective Debridement Selective Debridement - Location: sacral pressure injury Selective Debridement - Tools Used: Forceps;Scissors Selective Debridement - Tissue Removed: yellow slough   Wound Assessment and Plan  Wound Therapy - Assess/Plan/Recommendations Wound Therapy - Clinical Statement: Pt with deep tissue pressure injury which evolved now into unstageable pressure injury. Pt s/p bedside debridement of sacral pressure injury on 03/23/18 and currently still with nonviable yellow slough present.  Pt with moderate bleeding during session so attempted to perform only on necrotic tissue as possible.  Pt would benefit from continued hydrotherapy to assist with removing nonviable tissue to promote wound healing.  Dressing did have purulent blue/green drainage today and RN notified. Wound Therapy - Functional Problem List: Pt wound benefit from hydrotherapy to assist with wound healing and educating on  repositioning technique. Factors Delaying/Impairing Wound Healing: Diabetes Mellitus;Immobility;Multiple medical problems Hydrotherapy Plan: Debridement;Dressing change;Patient/family education;Pulsatile lavage with suction Wound Therapy - Frequency: 6X / week Wound Therapy - Follow Up Recommendations: Skilled nursing facility Wound Plan: Continue hydrotherapy as outlined above to promote wound healing  Wound Therapy Goals- Improve the function of patient's integumentary system by progressing the wound(s) through the phases of wound healing (inflammation - proliferation - remodeling) by: Decrease Necrotic Tissue to: 20 Decrease Necrotic Tissue - Progress: Goal set today Increase Granulation Tissue to: 80 Increase Granulation Tissue - Progress: Goal set today Improve Drainage Characteristics: Mod Improve Drainage Characteristics - Progress: Goal set today Patient/Family will be  able to : Pt will be able to state appropriate repositioning techniques. Patient/Family Instruction Goal - Progress: Progressing toward goal Goals/treatment plan/discharge plan were made with and agreed upon by patient/family: Yes Time For Goal Achievement: 2 weeks Wound Therapy - Potential for Goals: Fair  Goals will be updated until maximal potential achieved or discharge criteria met.  Discharge criteria: when goals achieved, discharge from hospital, MD decision/surgical intervention, no progress towards goals, refusal/missing three consecutive treatments without notification or medical reason.  Carmelia Bake, PT, DPT Acute Rehabilitation Services Office: [phone] Pager: [phone]      Trena Platt 03/24/2018, 4:59 PM

## 2018-03-25 LAB — GLUCOSE, CAPILLARY
Glucose-Capillary: 64 mg/dL — ABNORMAL LOW (ref 70–99)
Glucose-Capillary: 67 mg/dL — ABNORMAL LOW (ref 70–99)
Glucose-Capillary: 85 mg/dL (ref 70–99)
Glucose-Capillary: 105 mg/dL — ABNORMAL HIGH (ref 70–99)
Glucose-Capillary: 80 mg/dL (ref 70–99)
Glucose-Capillary: 89 mg/dL (ref 70–99)

## 2018-03-25 MED ORDER — DEXTROSE 5 % IV SOLN
INTRAVENOUS | Status: AC
  Administered 2018-03-25: 11:00:00 via INTRAVENOUS

## 2018-03-25 NOTE — Progress Notes (Signed)
Physical Therapy Wound Treatment Patient Details  Name: Latasha Guzman MRN: [id number] Date of Birth: 01/14/1946  Today's Date: 03/25/2018 Time: 9323-5573 Time Calculation (min): 32 min  Subjective  Subjective: Pt reports pain with movement and discussed importance of repositioning and remaining off sacral area to allow healing however pt wished to remain supine end of session. Patient and Family Stated Goals: wound healing Prior Treatments: status post bedside debridement 03/23/2018  Pain Score: Pt reports pain/discomfort with hydrotherapy however premedicated, provided rest breaks as needed.  Wound Assessment     03/25/18 1500  Subjective Assessment  Subjective Pt reports pain with movement and discussed importance of repositioning and remaining off sacral area to allow healing however pt wished to remain supine end of session.  Patient and Family Stated Goals wound healing  Prior Treatments status post bedside debridement 03/23/2018  Evaluation and Treatment  Evaluation and Treatment Procedures Explained to Patient/Family Yes  Evaluation and Treatment Procedures agreed to  Pressure Injury 03/24/18 Unstageable - Full thickness tissue loss in which the base of the ulcer is covered by slough (yellow, tan, gray, green or brown) and/or eschar (tan, brown or black) in the wound bed. HYDROTHERAPY  Date First Assessed/Time First Assessed: 03/24/18 1430   Location: Sacrum  Location Orientation: Mid  Staging: Unstageable - Full thickness tissue loss in which the base of the ulcer is covered by slough (yellow, tan, gray, green or brown) and/or esch...  Dressing Type Gauze (Comment);Foam;Moist to dry (santyl)  Dressing Changed  Dressing Change Frequency Twice a day  State of Healing Eschar  Site / Wound Assessment Bleeding;Red;Yellow;Painful  % Wound base Red or Granulating 50%  % Wound base Yellow/Fibrinous Exudate 50%  Peri-wound Assessment Erythema (blanchable);Pink  Margins Unattached  edges (unapproximated)  Drainage Amount Moderate  Drainage Description Purulent;Serosanguineous  Treatment Debridement (Selective);Hydrotherapy (Pulse lavage);Packing (Saline gauze)  Hydrotherapy  Pulsed lavage therapy - wound location sacral pressure injury  Pulsed Lavage with Suction (psi) 8 psi  Pulsed Lavage with Suction - Normal Saline Used 1000 mL  Pulsed Lavage Tip Tip with splash shield  Selective Debridement  Selective Debridement - Location sacral pressure injury  Selective Debridement - Tools Used Forceps;Scissors  Selective Debridement - Tissue Removed yellow slough  Wound Therapy - Assess/Plan/Recommendations  Wound Therapy - Clinical Statement Pt with deep tissue pressure injury which evolved now into unstageable pressure injury. Pt s/p bedside debridement of sacral pressure injury on 03/23/18 and currently still with nonviable yellow slough present.  Pt with moderate bleeding during session so attempted to perform only on necrotic tissue as possible.  Pt would benefit from continued hydrotherapy to assist with removing nonviable tissue to promote wound healing.   Wound Therapy - Functional Problem List Pt wound benefit from hydrotherapy to assist with wound healing and educating on repositioning technique.  Factors Delaying/Impairing Wound Healing Diabetes Mellitus;Immobility;Multiple medical problems  Hydrotherapy Plan Debridement;Dressing change;Patient/family education;Pulsatile lavage with suction  Wound Therapy - Frequency 6X / week  Wound Therapy - Follow Up Recommendations Skilled nursing facility  Wound Plan Continue hydrotherapy as outlined above to promote wound healing  Wound Therapy Goals - Improve the function of patient's integumentary system by progressing the wound(s) through the phases of wound healing by:  Decrease Necrotic Tissue to 20  Decrease Necrotic Tissue - Progress Progressing toward goal  Increase Granulation Tissue to 80  Increase Granulation Tissue  - Progress Progressing toward goal  Improve Drainage Characteristics Mod  Improve Drainage Characteristics - Progress Progressing toward goal  Patient/Family will  be able to  Pt will be able to state appropriate repositioning techniques.  Patient/Family Instruction Goal - Progress Not progressing  Goals/treatment plan/discharge plan were made with and agreed upon by patient/family Yes  Time For Goal Achievement 2 weeks  Wound Therapy - Potential for Goals Fair                                                                                                                                                                                                                                      Hydrotherapy Pulsed lavage therapy - wound location: sacral pressure injury Pulsed Lavage with Suction (psi): 8 psi Pulsed Lavage with Suction - Normal Saline Used: 1000 mL Pulsed Lavage Tip: Tip with splash shield Selective Debridement Selective Debridement - Location: sacral pressure injury Selective Debridement - Tools Used: Forceps;Scissors Selective Debridement - Tissue Removed: yellow slough   Wound Assessment and Plan  Wound Therapy - Assess/Plan/Recommendations Wound Therapy - Clinical Statement: Pt with deep tissue pressure injury which evolved now into unstageable pressure injury. Pt s/p bedside debridement of sacral pressure injury on 03/23/18 and currently still with nonviable yellow slough present.  Pt with moderate bleeding during session so attempted to perform only on necrotic tissue as possible.  Pt would benefit from continued hydrotherapy to assist with removing nonviable tissue to promote wound healing.  Wound Therapy - Functional Problem List: Pt wound benefit from hydrotherapy to assist with wound healing and educating on repositioning technique. Factors Delaying/Impairing Wound Healing: Diabetes Mellitus;Immobility;Multiple  medical problems Hydrotherapy Plan: Debridement;Dressing change;Patient/family education;Pulsatile lavage with suction Wound Therapy - Frequency: 6X / week Wound Therapy - Follow Up Recommendations: Skilled nursing facility Wound Plan: Continue hydrotherapy as outlined above to promote wound healing  Wound Therapy Goals- Improve the function of patient's integumentary system by progressing the wound(s) through the phases of wound healing (inflammation - proliferation - remodeling) by: Decrease Necrotic Tissue to: 20 Decrease Necrotic Tissue - Progress: Progressing toward goal Increase Granulation Tissue to: 80 Increase Granulation Tissue - Progress: Progressing toward goal Improve Drainage Characteristics: Mod Improve Drainage Characteristics - Progress: Progressing toward goal Patient/Family will be able to : Pt will be able to state appropriate repositioning techniques. Patient/Family Instruction Goal - Progress: Not progressing Goals/treatment plan/discharge plan were made with and agreed upon by patient/family: Yes Time For Goal Achievement: 2 weeks Wound Therapy - Potential for Goals: Fair  Goals will be updated until maximal  potential achieved or discharge criteria met.  Discharge criteria: when goals achieved, discharge from hospital, MD decision/surgical intervention, no progress towards goals, refusal/missing three consecutive treatments without notification or medical reason.      Jonathan Corpus,KATHrine E 03/25/2018, 3:36 PM Carmelia Bake, PT, DPT Acute Rehabilitation Services Office: [phone] Pager: [phone]

## 2018-03-25 NOTE — Progress Notes (Signed)
Hypoglycemic Event  CBG: 64  Treatment: 4 oz juice/soda  Symptoms: None  Follow-up CBG: [id number] CBG Result:67  Possible Reasons for Event: Inadequate meal intake  Comments/MD notified:Dr Rodena Piety notified @ [phone]. Repeat CBG still low, gave another 4 oz of apple juice, will recheck in 15 min.    Latasha Guzman A

## 2018-03-25 NOTE — Progress Notes (Signed)
Hypoglycemic Event  CBG: 67  Treatment: 4 oz juice/soda  Symptoms: None  Follow-up CBG: Time:2205 CBG Result:76  Possible Reasons for Event: Inadequate meal intake  Comments/MD notified: Bodenhiemer   Patient drink apple juice. Patient also wanted/ate Peaches and graham crackers.  Gonzella Lex

## 2018-03-25 NOTE — Progress Notes (Signed)
PROGRESS NOTE    Latasha Guzman  TMA:[id number] DOB: Sep 18, 1945 DOA: 03/04/2018 PCP: Nicoletta Dress, MD   Brief Narrative17 year old female with history of chronic A. Fib with pacemaker, DM 2, hypertension, diastolic CHF and lymphedema transferred from [facility] due to symptomatic anemia in the setting of GI bleed. Hemoglobin 6.3 on arrival here. Chest x-ray with bilateral interstitial infiltrate with bilateral pleural effusion and vascular congestion. EKG with atrial fibrillation with RVR to 123 and PVCs. She was transfused 2 units of blood with appropriate response. IMA globin remained stable between 8 and 9. Per previous provider, GI consulted and recommended observation as hemoglobin remained stable. Apixaban resumed. Hemoglobin slowly trended down to 7.8. Transfused 1 unit of blood appropriate response.  Patient was aggressively diuresed with IV lasix 80 mg 3 times daily for fluid overload and pulmonary vascular congestion. On 1/23, abdominal pain concerning for ascites. No history of cirrhosis, hepatitis or EtOH use per patient or chart review. Ammonia on the upper side of normal at 39. Limited abdominal ultrasound showed moderate ascites in all quadrants. Had paracentesis with removal of 2 L on 1/24. Abdominal exam, fluid cytology and culture not consistent with SBP. SAAG greater than 1.1 suggestive for some portal hypertension. RUQ ultrasound obtained and confirmed liver cirrhosis with patent portal vein and normal blood flow.   Assessment & Plan:   Principal Problem:   GI bleed Active Problems:   Acute blood loss anemia   Chronic atrial fibrillation   Diabetes mellitus (HCC)   Essential hypertension   GERD (gastroesophageal reflux disease)   Acute diastolic CHF (congestive heart failure) (HCC)   Acute GI bleeding   Pressure injury of skin   Shortness of breath   Hematochezia   Moderate malnutrition (HCC)  Deep tissue pressure injury/unstageable pressure ulcer  -status post bedside debridement 03/23/2018.  Continue PT hydrotherapy.  SW looking for SNF which accepts hydrotherapy.   Acute on chronic diastolic CHF exacerbation/pulmonary hypertension: Echo with EF of 55 to 60%, no wall motion abnormalities but some diastolic flattening, severe LAE and PA PP of 64. Chest x-ray with pulmonary congestion and pleural effusion on admission. Still with significant lymphedema and dependent edema which is likely a combination of CHF, cirrhosis and hypoalbuminemia. Fair response to IV Lasix 80 mg 3 times daily. Transitioned to oral torsemide 80 mg daily with Aldactone 50 mg daily on 1/25, then torsemide40 mg and Aldactone to 100 mg on 1/27.Patient really was not able to tolerate the torsemide Aldactone beta-blocker and Cardizem due to soft blood pressure. Her doses were adjusted due to soft BP.  -Newly diagnosed liver cirrhosis/ascites: noted on abdominal ultrasound although patient denies history of liver disease, hepatitis or EtOH. No signs of SBP. Albumin low. Ammonia 39. Paracentesis yielded 2 L on 1/24. Cytology and culture not consistent with SBP. No encephalopathy. -Continue torsemide, Aldactone and lactulose as above.  Atrial fibrillation with RVR: RVR resolved. -Continue beta-blocker, CCB and Eliquis. -If no further surgery planned we can switch to Coumadin and DC Eliquis in view of her new onset cirrhosis.  CKD 3: Slight uptrend -Continue monitoring  DM2:` Her blood sugar has been running low secondary to decreased p.o. intake patient was not taking anything for diabetes at home prior to admission to hospital.  Since her blood sugar has been persistently low I will put her on D5 50 cc an hour for 24 hours.  Bladder outlet obstruction: urinary obstruction noted on bladder scan.Foley catheter placed on admission. Failed voiding trial 1/26. Passed voiding  trial with Urecholine 1/27.  Anemia of chronic disease received blood transfusion 1  unit 03/23/2018   Pressure Injury 03/04/18 Deep Tissue Injury - Purple or maroon localized area of discolored intact skin or blood-filled blister due to damage of underlying soft tissue from pressure and/or shear. (Active)  03/04/18 0500  Location: Sacrum  Location Orientation: Mid  Staging: Deep Tissue Injury - Purple or maroon localized area of discolored intact skin or blood-filled blister due to damage of underlying soft tissue from pressure and/or shear.  Wound Description (Comments):   Present on Admission: Yes     Pressure Injury 03/06/18 Stage III -  Full thickness tissue loss. Subcutaneous fat may be visible but bone, tendon or muscle are NOT exposed. (Active)  03/06/18 0721  Location: Sacrum  Location Orientation: Mid  Staging: Stage III -  Full thickness tissue loss. Subcutaneous fat may be visible but bone, tendon or muscle are NOT exposed.  Wound Description (Comments):   Present on Admission: Yes     Pressure Injury 03/24/18 Unstageable - Full thickness tissue loss in which the base of the ulcer is covered by slough (yellow, tan, gray, green or brown) and/or eschar (tan, brown or black) in the wound bed. HYDROTHERAPY (Active)  03/24/18 1430  Location: Sacrum  Location Orientation: Mid  Staging: Unstageable - Full thickness tissue loss in which the base of the ulcer is covered by slough (yellow, tan, gray, green or brown) and/or eschar (tan, brown or black) in the wound bed.  Wound Description (Comments): HYDROTHERAPY  Present on Admission:       Nutrition Problem: Moderate Malnutrition Etiology: chronic illness(CHF)     Signs/Symptoms: mild fat depletion, severe muscle depletion, edema    Interventions: Ensure Enlive (each supplement provides 350kcal and 20 grams of protein), MVI  Estimated body mass index is 30.85 kg/m as calculated from the following:   Height as of this encounter: 5\' 7"  (1.702 m).   Weight as of this encounter: 89.4 kg.  DVT  prophylaxis: Eliquis Code Status: Full code Family Communication none Disposition Plan pending clinical improvement Consultants:  General surgery  Procedures: Debridement of the sacral wound Antimicrobials: None  Subjective: Resting in bed was sleeping when I walked in the room but he was able to wake her up without any difficulty she is awake alert at this time answering all my questions appropriately concerned about when she can be discharged interested in going to [facility]   Objective: Vitals:   03/24/18 2103 03/24/18 2337 03/25/18 0436 03/25/18 0441  BP: 120/67 (!) 86/69  115/77  Pulse: 99 71  100  Resp: 16   14  Temp: (!) 97.5 F (36.4 C)   97.6 F (36.4 C)  TempSrc: Oral   Oral  SpO2: 99%   98%  Weight:   89.4 kg   Height:        Intake/Output Summary (Last 24 hours) at 03/25/2018 1043 Last data filed at 03/25/2018 0600 Gross per 24 hour  Intake 240 ml  Output -  Net 240 ml   Filed Weights   03/22/18 2053 03/23/18 0500 03/25/18 0436  Weight: 88.5 kg 89.8 kg 89.4 kg    Examination:  General exam: Appears calm and comfortable  Respiratory system: Clear to auscultation. Respiratory effort normal. Cardiovascular system: S1 & S2 heard, RRR. No JVD, murmurs, rubs, gallops or clicks. No pedal edema. Gastrointestinal system: Abdomen is nondistended, soft and nontender. No organomegaly or masses felt. Normal bowel sounds heard. Central nervous system: Alert and  oriented. No focal neurological deficits. Extremities: 2+ edema Skin: No rashes, lesions or ulcers Psychiatry: Judgement and insight appear normal. Mood & affect appropriate.     Data Reviewed: I have personally reviewed following labs and imaging studies  CBC: Recent Labs  Lab 03/21/18 0435 03/22/18 0458 03/22/18 1757 03/23/18 0435 03/24/18 0405  WBC  --   --  9.3 9.0 8.5  HGB 8.3* 8.3* 7.7* 7.5* 8.4*  HCT 27.4* 27.3* 25.0* 24.0* 27.3*  MCV  --   --  92.9 94.5 91.3  PLT  --   --  236 226 132   Basic  Metabolic Panel: Recent Labs  Lab 03/19/18 0429 03/20/18 0432 03/21/18 0435 03/22/18 0458 03/23/18 0435 03/24/18 0405  NA 137 134* 134* 133* 133* 132*  K 3.6 3.6 3.9 3.8 3.9 3.9  CL 101 97* 96* 96* 96* 96*  CO2 29 29 29 28 27 28   GLUCOSE 92 91 87 91 89 83  BUN 38* 37* 39* 41* 47* 51*  CREATININE 0.91 1.04* 1.08* 1.21* 1.31* 1.36*  CALCIUM 8.8* 8.9 8.9 9.1 8.9 8.7*  MG 2.0 1.9 1.9 1.9  --   --    GFR: Estimated Creatinine Clearance: 43.5 mL/min (A) (by C-G formula based on SCr of 1.36 mg/dL (H)). Liver Function Tests: Recent Labs  Lab 03/19/18 0429 03/21/18 0435  AST 17  --   ALT 6  --   ALKPHOS 53  --   BILITOT 2.1*  --   PROT 5.3*  --   ALBUMIN 2.0* 1.9*   No results for input(s): LIPASE, AMYLASE in the last 168 hours. Recent Labs  Lab 03/20/18 0432  AMMONIA 29   Coagulation Profile: Recent Labs  Lab 03/22/18 1757  INR 2.72   Cardiac Enzymes: No results for input(s): CKTOTAL, CKMB, CKMBINDEX, TROPONINI in the last 168 hours. BNP (last 3 results) No results for input(s): PROBNP in the last 8760 hours. HbA1C: No results for input(s): HGBA1C in the last 72 hours. CBG: Recent Labs  Lab 03/24/18 2151 03/24/18 2205 03/25/18 0751 03/25/18 0821 03/25/18 0853  GLUCAP 67* 76 64* 67* 85   Lipid Profile: No results for input(s): CHOL, HDL, LDLCALC, TRIG, CHOLHDL, LDLDIRECT in the last 72 hours. Thyroid Function Tests: No results for input(s): TSH, T4TOTAL, FREET4, T3FREE, THYROIDAB in the last 72 hours. Anemia Panel: No results for input(s): VITAMINB12, FOLATE, FERRITIN, TIBC, IRON, RETICCTPCT in the last 72 hours. Sepsis Labs: No results for input(s): PROCALCITON, LATICACIDVEN in the last 168 hours.  Recent Results (from the past 240 hour(s))  Body fluid culture     Status: None   Collection Time: 03/17/18 11:53 AM  Result Value Ref Range Status   Specimen Description   Final    PARACENTESIS PERITONEAL Performed at [facility] [address] [place], [place] [address]    Special Requests   Final    NONE Performed at [facility], [place] [address] [place], [place] [address]    Gram Stain   Final    WBC PRESENT, PREDOMINANTLY PMN NO ORGANISMS SEEN CYTOSPIN SMEAR    Culture   Final    NO GROWTH 3 DAYS Performed at [facility], [place] [address] [place], [place] [address]    Report Status 03/20/2018 FINAL  Final         Radiology Studies: No results found.      Scheduled Meds: . sodium chloride   Intravenous Once  . sodium chloride  Intravenous Once  . apixaban  5 mg Oral BID  . bethanechol  10 mg Oral TID  . collagenase   Topical Daily  . diltiazem  60 mg Oral Q6H  . feeding supplement (ENSURE ENLIVE)  237 mL Oral BID BM  . hydrocortisone   Rectal BID  . insulin aspart  0-5 Units Subcutaneous QHS  . insulin aspart  0-9 Units Subcutaneous TID WC  . lactulose  30 g Oral Daily  . metoprolol tartrate  12.5 mg Oral BID  . multivitamin with minerals  1 tablet Oral Daily  . pantoprazole  40 mg Oral Daily  . pneumococcal 23 valent vaccine  0.5 mL Intramuscular Tomorrow-1000  . sodium chloride flush  3 mL Intravenous Q12H  . torsemide  20 mg Oral Daily   Continuous Infusions:   LOS: 21 days      Georgette Shell, MD [facility] If 7PM-7AM, please contact night-coverage [email or web address] Password [email or web address] 03/25/2018, 10:43 AM

## 2018-03-26 DIAGNOSIS — R601 Generalized edema: Secondary | ICD-10-CM

## 2018-03-26 LAB — COMPREHENSIVE METABOLIC PANEL
ALT: 6 U/L (ref 0–44)
AST: 21 U/L (ref 15–41)
Albumin: 1.9 g/dL — ABNORMAL LOW (ref 3.5–5.0)
Alkaline Phosphatase: 60 U/L (ref 38–126)
Anion gap: 8 (ref 5–15)
BUN: 46 mg/dL — AB (ref 8–23)
CO2: 30 mmol/L (ref 22–32)
Calcium: 9 mg/dL (ref 8.9–10.3)
Chloride: 96 mmol/L — ABNORMAL LOW (ref 98–111)
Creatinine, Ser: 1.1 mg/dL — ABNORMAL HIGH (ref 0.44–1.00)
GFR calc Af Amer: 58 mL/min — ABNORMAL LOW (ref >60)
GFR, EST NON AFRICAN AMERICAN: 50 mL/min — AB (ref >60)
Glucose, Bld: 100 mg/dL — ABNORMAL HIGH (ref 70–99)
Potassium: 4 mmol/L (ref 3.5–5.1)
Sodium: 134 mmol/L — ABNORMAL LOW (ref 135–145)
Total Bilirubin: 2.1 mg/dL — ABNORMAL HIGH (ref 0.3–1.2)
Total Protein: 5.2 g/dL — ABNORMAL LOW (ref 6.5–8.1)

## 2018-03-26 LAB — GLUCOSE, CAPILLARY
Glucose-Capillary: 73 mg/dL (ref 70–99)
Glucose-Capillary: 101 mg/dL — ABNORMAL HIGH (ref 70–99)
Glucose-Capillary: 112 mg/dL — ABNORMAL HIGH (ref 70–99)

## 2018-03-26 LAB — CBC
HCT: 29.2 % — ABNORMAL LOW (ref 36.0–46.0)
Hemoglobin: 9 g/dL — ABNORMAL LOW (ref 12.0–15.0)
MCH: 28.5 pg (ref 26.0–34.0)
MCHC: 30.8 g/dL (ref 30.0–36.0)
MCV: 92.4 fL (ref 80.0–100.0)
Platelets: 302 10*3/uL (ref 150–400)
RBC: 3.16 MIL/uL — ABNORMAL LOW (ref 3.87–5.11)
RDW: 18.1 % — ABNORMAL HIGH (ref 11.5–15.5)
WBC: 7.2 10*3/uL (ref 4.0–10.5)
nRBC: 0 % (ref 0.0–0.2)

## 2018-03-26 NOTE — Progress Notes (Signed)
Patient stated that PT told her that her hydrotherapy would be at 1400 today, she also had multiple visitors this afternoon until 1600, gave PO meds and instructed patient that this RN would be doing dressing change at this time because unsure if PT would be coming, patient refused stating that she just got settled enough to rest

## 2018-03-26 NOTE — Progress Notes (Signed)
PROGRESS NOTE    Latasha Guzman  OZY:[id number] DOB: 1945-03-21 DOA: 03/04/2018 PCP: Nicoletta Dress, MD   Brief Narrative: 73 year old female with history of chronic A. Fib with pacemaker, DM 2, hypertension, diastolic CHF and lymphedema transferred from [facility] due to symptomatic anemia in the setting of GI bleed. Hemoglobin 6.3 on arrival here. Chest x-ray with bilateral interstitial infiltrate with bilateral pleural effusion and vascular congestion. EKG with atrial fibrillation with RVR to 123 and PVCs. She was transfused 2 units of blood with appropriate response. IMA globin remained stable between 8 and 9. Per previous provider, GI consulted and recommended observation as hemoglobin remained stable. Apixaban resumed. Hemoglobin slowly trended down to 7.8. Transfused 1 unit of blood appropriate response.  Patient was aggressively diuresed with IV lasix 80 mg 3 times daily for fluid overload and pulmonary vascular congestion. On 1/23, abdominal pain concerning for ascites. No history of cirrhosis, hepatitis or EtOH use per patient or chart review. Ammonia on the upper side of normal at 39. Limited abdominal ultrasound showed moderate ascites in all quadrants. Had paracentesis with removal of 2 L on 1/24. Abdominal exam, fluid cytology and culture not consistent with SBP. SAAG greater than 1.1 suggestive for some portal hypertension. RUQ ultrasound obtained and confirmed liver cirrhosis with patent portal vein and normal blood flow.   Assessment & Plan:   Principal Problem:   GI bleed Active Problems:   Acute blood loss anemia   Chronic atrial fibrillation   Diabetes mellitus (HCC)   Essential hypertension   GERD (gastroesophageal reflux disease)   Acute diastolic CHF (congestive heart failure) (HCC)   Acute GI bleeding   Pressure injury of skin   Shortness of breath   Hematochezia   Moderate malnutrition (HCC)  Deep tissue pressure injury/unstageable pressure  ulcer-status post bedside debridement 03/23/2018.Continue PT hydrotherapy. SW looking for SNF which accepts hydrotherapy.   Acute on chronic diastolic CHF exacerbation/pulmonary hypertension: Echo with EF of 55 to 60%, no wall motion abnormalities but some diastolic flattening, severe LAE and PA PP of 64. Chest x-ray with pulmonary congestion and pleural effusion on admission. Still with significant lymphedema and dependent edema which is likely a combination of CHF, cirrhosis and hypoalbuminemia. Fair response to IV Lasix 80 mg 3 times daily. Transitioned to oral torsemide 80 mg daily with Aldactone 50 mg daily on 1/25, then torsemide40 mg and Aldactone to 100 mg on 1/27.Patient really was not able to tolerate the torsemide Aldactone beta-blocker and Cardizem due to soft blood pressure. Her doses were adjusted due to soft BP.  -Newly diagnosed liver cirrhosis/ascites: noted on abdominal ultrasound although patient denies history of liver disease, hepatitis or EtOH. No signs of SBP. Albumin low. Ammonia 39. Paracentesis yielded 2 L on 1/24. Cytology and culture not consistent with SBP. No encephalopathy. -Continue torsemide, Aldactone and lactulose as above.  Atrial fibrillation with RVR: RVR resolved. -Continue beta-blocker, CCB and Eliquis. -If no further surgery planned we can switch to Coumadin and DC Eliquis in view of her new onset cirrhosis.  CKD 3: Creatinine improving 1.10 today continue current management  DM2:` Her blood sugar has been running low secondary to decreased p.o. intake patient was not taking anything for diabetes at home prior to admission to hospital.  Blood sugars are still running low to low normal.  Monitor closely.  Patient reports eating a good breakfast this morning.  Bladder outlet obstruction: urinary obstruction noted on bladder scan.Foley catheter placed on admission. Failed voiding trial 1/26. Passed voiding  trial with Urecholine  1/27.  Anemia of chronic disease received blood transfusion 1 unit 03/23/2018 Pressure Injury 03/04/18 Deep Tissue Injury - Purple or maroon localized area of discolored intact skin or blood-filled blister due to damage of underlying soft tissue from pressure and/or shear. (Active)  03/04/18 0500  Location: Sacrum  Location Orientation: Mid  Staging: Deep Tissue Injury - Purple or maroon localized area of discolored intact skin or blood-filled blister due to damage of underlying soft tissue from pressure and/or shear.  Wound Description (Comments):   Present on Admission: Yes     Pressure Injury 03/06/18 Stage III -  Full thickness tissue loss. Subcutaneous fat may be visible but bone, tendon or muscle are NOT exposed. (Active)  03/06/18 0721  Location: Sacrum  Location Orientation: Mid  Staging: Stage III -  Full thickness tissue loss. Subcutaneous fat may be visible but bone, tendon or muscle are NOT exposed.  Wound Description (Comments):   Present on Admission: Yes     Pressure Injury 03/24/18 Unstageable - Full thickness tissue loss in which the base of the ulcer is covered by slough (yellow, tan, gray, green or brown) and/or eschar (tan, brown or black) in the wound bed. HYDROTHERAPY (Active)  03/24/18 1430  Location: Sacrum  Location Orientation: Mid  Staging: Unstageable - Full thickness tissue loss in which the base of the ulcer is covered by slough (yellow, tan, gray, green or brown) and/or eschar (tan, brown or black) in the wound bed.  Wound Description (Comments): HYDROTHERAPY  Present on Admission:       Nutrition Problem: Moderate Malnutrition Etiology: chronic illness(CHF)     Signs/Symptoms: mild fat depletion, severe muscle depletion, edema    Interventions: Ensure Enlive (each supplement provides 350kcal and 20 grams of protein), MVI  Estimated body mass index is 35.24 kg/m as calculated from the following:   Height as of this encounter: 5\' 7"  (1.702  m).   Weight as of this encounter: 102.1 kg.  DVT prophylaxis: Eliquis Code Status: Full code Family Communication: None Disposition Plan: Pending SNF for hydrotherapy  Consultants: General surgery  Procedures: Debridement Antimicrobials: None  Subjective: Feels okay anxious to be discharged to a rehab facility appetite is poor and do not like the food here.  Objective: Vitals:   03/25/18 1410 03/25/18 2116 03/26/18 0500 03/26/18 0506  BP: 106/78 97/70  102/63  Pulse: 89 94  95  Resp: 20 16  17   Temp: 97.7 F (36.5 C) (!) 97.5 F (36.4 C)  97.9 F (36.6 C)  TempSrc: Oral Oral  Oral  SpO2: 100% 98%  99%  Weight:   102.1 kg 102.1 kg  Height:        Intake/Output Summary (Last 24 hours) at 03/26/2018 1014 Last data filed at 03/26/2018 0953 Gross per 24 hour  Intake 1032.65 ml  Output 900 ml  Net 132.65 ml   Filed Weights   03/25/18 0436 03/26/18 0500 03/26/18 0506  Weight: 89.4 kg 102.1 kg 102.1 kg    Examination:  General exam: Appears calm and comfortable  Respiratory system: Clear to auscultation. Respiratory effort normal. Cardiovascular system: S1 & S2 heard, RRR. No JVD, murmurs, rubs, gallops or clicks. No pedal edema. Gastrointestinal system: Abdomen is distended, soft and nontender. No organomegaly or masses felt. Normal bowel sounds heard. Central nervous system: Alert and oriented. No focal neurological deficits. Extremities: 2+ pitting edema Skin: No rashes, lesions or ulcers Psychiatry: Judgement and insight appear normal. Mood & affect appropriate.  Data Reviewed: I have personally reviewed following labs and imaging studies  CBC: Recent Labs  Lab 03/22/18 0458 03/22/18 1757 03/23/18 0435 03/24/18 0405 03/26/18 0444  WBC  --  9.3 9.0 8.5 7.2  HGB 8.3* 7.7* 7.5* 8.4* 9.0*  HCT 27.3* 25.0* 24.0* 27.3* 29.2*  MCV  --  92.9 94.5 91.3 92.4  PLT  --  236 226 258 478   Basic Metabolic Panel: Recent Labs  Lab 03/20/18 0432 03/21/18 0435  03/22/18 0458 03/23/18 0435 03/24/18 0405 03/26/18 0444  NA 134* 134* 133* 133* 132* 134*  K 3.6 3.9 3.8 3.9 3.9 4.0  CL 97* 96* 96* 96* 96* 96*  CO2 29 29 28 27 28 30   GLUCOSE 91 87 91 89 83 100*  BUN 37* 39* 41* 47* 51* 46*  CREATININE 1.04* 1.08* 1.21* 1.31* 1.36* 1.10*  CALCIUM 8.9 8.9 9.1 8.9 8.7* 9.0  MG 1.9 1.9 1.9  --   --   --    GFR: Estimated Creatinine Clearance: 57.6 mL/min (A) (by C-G formula based on SCr of 1.1 mg/dL (H)). Liver Function Tests: Recent Labs  Lab 03/21/18 0435 03/26/18 0444  AST  --  21  ALT  --  6  ALKPHOS  --  60  BILITOT  --  2.1*  PROT  --  5.2*  ALBUMIN 1.9* 1.9*   No results for input(s): LIPASE, AMYLASE in the last 168 hours. Recent Labs  Lab 03/20/18 0432  AMMONIA 29   Coagulation Profile: Recent Labs  Lab 03/22/18 1757  INR 2.72   Cardiac Enzymes: No results for input(s): CKTOTAL, CKMB, CKMBINDEX, TROPONINI in the last 168 hours. BNP (last 3 results) No results for input(s): PROBNP in the last 8760 hours. HbA1C: No results for input(s): HGBA1C in the last 72 hours. CBG: Recent Labs  Lab 03/25/18 0853 03/25/18 1143 03/25/18 1802 03/25/18 2115 03/26/18 0743  GLUCAP 85 105* 80 89 73   Lipid Profile: No results for input(s): CHOL, HDL, LDLCALC, TRIG, CHOLHDL, LDLDIRECT in the last 72 hours. Thyroid Function Tests: No results for input(s): TSH, T4TOTAL, FREET4, T3FREE, THYROIDAB in the last 72 hours. Anemia Panel: No results for input(s): VITAMINB12, FOLATE, FERRITIN, TIBC, IRON, RETICCTPCT in the last 72 hours. Sepsis Labs: No results for input(s): PROCALCITON, LATICACIDVEN in the last 168 hours.  Recent Results (from the past 240 hour(s))  Body fluid culture     Status: None   Collection Time: 03/17/18 11:53 AM  Result Value Ref Range Status   Specimen Description   Final    PARACENTESIS PERITONEAL Performed at [facility] [address], [place], [place] [address]    Special Requests    Final    NONE Performed at [facility], [place] [address] [place], [place] [address]    Gram Stain   Final    WBC PRESENT, PREDOMINANTLY PMN NO ORGANISMS SEEN CYTOSPIN SMEAR    Culture   Final    NO GROWTH 3 DAYS Performed at [facility], [place] [address] [place],  [address]    Report Status 03/20/2018 FINAL  Final         Radiology Studies: No results found.      Scheduled Meds: . sodium chloride   Intravenous Once  . sodium chloride   Intravenous Once  . apixaban  5 mg Oral BID  . bethanechol  10 mg Oral TID  . collagenase   Topical Daily  . diltiazem  60 mg Oral  Q6H  . feeding supplement (ENSURE ENLIVE)  237 mL Oral BID BM  . hydrocortisone   Rectal BID  . insulin aspart  0-5 Units Subcutaneous QHS  . insulin aspart  0-9 Units Subcutaneous TID WC  . lactulose  30 g Oral Daily  . metoprolol tartrate  12.5 mg Oral BID  . multivitamin with minerals  1 tablet Oral Daily  . pantoprazole  40 mg Oral Daily  . pneumococcal 23 valent vaccine  0.5 mL Intramuscular Tomorrow-1000  . sodium chloride flush  3 mL Intravenous Q12H  . torsemide  20 mg Oral Daily   Continuous Infusions: . dextrose 50 mL/hr at 03/25/18 1108     LOS: 22 days     Georgette Shell, MD [facility]  If 7PM-7AM, please contact night-coverage [email or web address] Password [email or web address] 03/26/2018, 10:14 AM

## 2018-03-27 LAB — COMPREHENSIVE METABOLIC PANEL
ALT: 8 U/L (ref 0–44)
AST: 23 U/L (ref 15–41)
Albumin: 2 g/dL — ABNORMAL LOW (ref 3.5–5.0)
Alkaline Phosphatase: 65 U/L (ref 38–126)
Anion gap: 7 (ref 5–15)
BUN: 46 mg/dL — ABNORMAL HIGH (ref 8–23)
CO2: 29 mmol/L (ref 22–32)
Calcium: 9.1 mg/dL (ref 8.9–10.3)
Chloride: 97 mmol/L — ABNORMAL LOW (ref 98–111)
Creatinine, Ser: 1.05 mg/dL — ABNORMAL HIGH (ref 0.44–1.00)
GFR calc Af Amer: >60 mL/min (ref >60)
GFR calc non Af Amer: 53 mL/min — ABNORMAL LOW (ref >60)
Glucose, Bld: 118 mg/dL — ABNORMAL HIGH (ref 70–99)
Potassium: 3.6 mmol/L (ref 3.5–5.1)
SODIUM: 133 mmol/L — AB (ref 135–145)
Total Bilirubin: 1.9 mg/dL — ABNORMAL HIGH (ref 0.3–1.2)
Total Protein: 5.7 g/dL — ABNORMAL LOW (ref 6.5–8.1)

## 2018-03-27 LAB — GLUCOSE, CAPILLARY
Glucose-Capillary: 106 mg/dL — ABNORMAL HIGH (ref 70–99)
Glucose-Capillary: 80 mg/dL (ref 70–99)
Glucose-Capillary: 108 mg/dL — ABNORMAL HIGH (ref 70–99)
Glucose-Capillary: 81 mg/dL (ref 70–99)

## 2018-03-27 LAB — PROTIME-INR
INR: 1.94
Prothrombin Time: 21.9 seconds — ABNORMAL HIGH (ref 11.4–15.2)

## 2018-03-27 MED ORDER — DILTIAZEM HCL 60 MG PO TABS: 60.0000 mg | ORAL_TABLET | Freq: Four times a day (QID) | ORAL | 1 refills | Status: AC

## 2018-03-27 MED ORDER — METOPROLOL TARTRATE 25 MG PO TABS: 12.5000 mg | ORAL_TABLET | Freq: Two times a day (BID) | ORAL | 0 refills | Status: AC

## 2018-03-27 MED ORDER — ADULT MULTIVITAMIN W/MINERALS CH: 1.0000 | ORAL_TABLET | Freq: Every day | ORAL | Status: AC

## 2018-03-27 MED ORDER — LIP MEDEX EX OINT: TOPICAL_OINTMENT | CUTANEOUS | 0 refills | Status: DC | PRN

## 2018-03-27 MED ORDER — TRAMADOL HCL 50 MG PO TABS: 50.0000 mg | ORAL_TABLET | Freq: Two times a day (BID) | ORAL | 0 refills | Status: DC | PRN

## 2018-03-27 MED ORDER — DIPHENHYDRAMINE HCL 25 MG PO CAPS
25.0000 mg | ORAL_CAPSULE | Freq: Once | ORAL | Status: AC
  Administered 2018-03-27: 25 mg via ORAL
  Filled 2018-03-27: qty 1

## 2018-03-27 MED ORDER — TRAZODONE HCL 50 MG PO TABS: 25.0000 mg | ORAL_TABLET | Freq: Every evening | ORAL | 0 refills | Status: AC | PRN

## 2018-03-27 MED ORDER — COLLAGENASE 250 UNIT/GM EX OINT: TOPICAL_OINTMENT | Freq: Every day | CUTANEOUS | 0 refills | Status: DC

## 2018-03-27 MED ORDER — HYDROCORTISONE 2.5 % RE CREA: TOPICAL_CREAM | Freq: Two times a day (BID) | RECTAL | 0 refills | Status: DC

## 2018-03-27 NOTE — Discharge Summary (Signed)
Physician Discharge Summary  Latasha Guzman IRW:[id number] DOB: 1945/02/28 DOA: 03/04/2018  PCP: Nicoletta Dress, MD  Admit date: 03/04/2018 Discharge date: 03/27/2018  Admitted From home Disposition: Nursing home  Recommendations for Outpatient Follow-up:  1. Follow up with PCP in 1-2 weeks 2. Please obtain ammonia, INR CMP/CBC in one week 3. Follow-up with wound care at the facility or outpatient wound clinic   Home Health none Equipment/Devices: None  Discharge Condition stable and improved CODE STATUS: Full code Diet recommendation: Cardiac  Brief/Interim Summary:73 year old female with history of chronic A. Fib with pacemaker, DM 2, hypertension, diastolic CHF and lymphedema transferred from [facility] due to symptomatic anemia in the setting of GI bleed. Hemoglobin 6.3 on arrival here. Chest x-ray with bilateral interstitial infiltrate with bilateral pleural effusion and vascular congestion. EKG with atrial fibrillation with RVR to 123 and PVCs. She was transfused 2 units of blood with appropriate response. IMA globin remained stable between 8 and 9. Per previous provider, GI consulted and recommended observation as hemoglobin remained stable. Apixaban resumed. Hemoglobin slowly trended down to 7.8. Transfused 1 unit of blood appropriate response.  Patient was aggressively diuresed with IV lasix 80 mg 3 times daily for fluid overload and pulmonary vascular congestion. On 1/23, abdominal pain concerning for ascites. No history of cirrhosis, hepatitis or EtOH use per patient or chart review. Ammonia on the upper side of normal at 39. Limited abdominal ultrasound showed moderate ascites in all quadrants. Had paracentesis with removal of 2 L on 1/24. Abdominal exam, fluid cytology and culture not consistent with SBP. SAAG greater than 1.1 suggestive for some portal hypertension. RUQ ultrasound obtained and confirmed liver cirrhosis with patent portal vein and normal blood flow.      Discharge Diagnoses:  Principal Problem:   GI bleed Active Problems:   Acute blood loss anemia   Chronic atrial fibrillation   Diabetes mellitus (HCC)   Essential hypertension   GERD (gastroesophageal reflux disease)   Acute diastolic CHF (congestive heart failure) (HCC)   Acute GI bleeding   Pressure injury of skin   Shortness of breath   Hematochezia   Moderate malnutrition (HCC)   Anasarca  Deep tissue pressure injury/unstageable pressure ulcer-status post bedside debridement 03/23/2018.Continu hydrotherapy daily with twice daily dressing changes with Santyl saline dampened Kerlix and dry ABD pad.  Follow-up with wound clinic.   Acute on chronic diastolic CHF exacerbation/pulmonary hypertension: Echo with EF of 55 to 60%, no wall motion abnormalities but some diastolic flattening, severe LAE and PA PP of 64. Continue Lasix.  She was treated treated with Bumex at the hospital along with Aldactone.  Her blood pressure remains soft.  Later renal functions on diuretics.   -Newly diagnosed liver cirrhosis/ascites: noted on abdominal ultrasound although patient denies history of liver disease, hepatitis or EtOH. No signs of SBP. Albumin low. Ammonia 39. Paracentesis yielded 2 L on 1/24. Cytology and culture not consistent with SBP. No encephalopathy. -Continue Lasix.  Aldactone was stopped due to soft BP.  Atrial fibrillation with RVR: RVR resolved. -Continue beta-blocker, CCB and Eliquis.  CKD 3: Creatinine monitor  2-3 times a week.  DM2:`Her blood sugar has been running low secondary to decreased p.o. intake.  She is not a diabetic.  Encourage encourage her to eat.  Bladder outlet obstruction: urinary obstruction noted on bladder scan.Foley catheter placed on admission. Failed voiding trial 1/26. Passed voiding trial with Urecholine 1/27.  Anemia of chronic disease received blood transfusion 1 unit 03/23/2018  Pressure Injury 03/04/18  Deep Tissue  Injury - Purple or maroon localized area of discolored intact skin or blood-filled blister due to damage of underlying soft tissue from pressure and/or shear. (Active)  03/04/18 0500  Location: Sacrum  Location Orientation: Mid  Staging: Deep Tissue Injury - Purple or maroon localized area of discolored intact skin or blood-filled blister due to damage of underlying soft tissue from pressure and/or shear.  Wound Description (Comments):   Present on Admission: Yes     Pressure Injury 03/06/18 Stage III -  Full thickness tissue loss. Subcutaneous fat may be visible but bone, tendon or muscle are NOT exposed. (Active)  03/06/18 0721  Location: Sacrum  Location Orientation: Mid  Staging: Stage III -  Full thickness tissue loss. Subcutaneous fat may be visible but bone, tendon or muscle are NOT exposed.  Wound Description (Comments):   Present on Admission: Yes     Pressure Injury 03/24/18 Unstageable - Full thickness tissue loss in which the base of the ulcer is covered by slough (yellow, tan, gray, green or brown) and/or eschar (tan, brown or black) in the wound bed. HYDROTHERAPY (Active)  03/24/18 1430  Location: Sacrum  Location Orientation: Mid  Staging: Unstageable - Full thickness tissue loss in which the base of the ulcer is covered by slough (yellow, tan, gray, green or brown) and/or eschar (tan, brown or black) in the wound bed.  Wound Description (Comments): HYDROTHERAPY  Present on Admission:       Nutrition Problem: Moderate Malnutrition Etiology: chronic illness(CHF)    Signs/Symptoms: mild fat depletion, severe muscle depletion, edema     Interventions: Ensure Enlive (each supplement provides 350kcal and 20 grams of protein), MVI  Estimated body mass index is 35.71 kg/m as calculated from the following:   Height as of this encounter: 5\' 7"  (1.702 m).   Weight as of this encounter: 103.4 kg.  Discharge Instructions  Discharge Instructions    Call MD for:   difficulty breathing, headache or visual disturbances   Complete by:  As directed    Call MD for:  persistant nausea and vomiting   Complete by:  As directed    Call MD for:  severe uncontrolled pain   Complete by:  As directed    Diet - low sodium heart healthy   Complete by:  As directed    Increase activity slowly   Complete by:  As directed      Allergies as of 03/27/2018      Reactions   Iodine Hives, Other (See Comments), Shortness Of Breath   Throat swells   Iodinated Diagnostic Agents    Penicillins Hives   Has patient had a PCN reaction causing immediate rash, facial/tongue/throat swelling, SOB or lightheadedness with hypotension: Yes Has patient had a PCN reaction causing severe rash involving mucus membranes or skin necrosis: No Has patient had a PCN reaction that required hospitalization: No Has patient had a PCN reaction occurring within the last 10 years: No If all of the above answers are "NO", then may proceed with Cephalosporin use.      Medication List    STOP taking these medications   doxycycline 100 MG tablet Commonly known as:  VIBRA-TABS   guaiFENesin 600 MG 12 hr tablet Commonly known as:  MUCINEX     TAKE these medications   atorvastatin 80 MG tablet Commonly known as:  LIPITOR Take 80 mg by mouth at bedtime.   cholecalciferol 25 MCG (1000 UT) tablet Commonly known as:  VITAMIN D3 Take  1,000 Units by mouth daily.   collagenase ointment Commonly known as:  SANTYL Apply topically daily.   diltiazem 60 MG tablet Commonly known as:  CARDIZEM Take 1 tablet (60 mg total) by mouth every 6 (six) hours. What changed:    medication strength  how much to take   ELIQUIS 5 MG Tabs tablet Generic drug:  apixaban Take 5 mg by mouth 2 (two) times daily.   fenofibrate 160 MG tablet Take 160 mg by mouth every morning.   furosemide 40 MG tablet Commonly known as:  LASIX Take 40 mg by mouth daily.   hydrocortisone 2.5 % rectal cream Commonly  known as:  ANUSOL-HC Place rectally 2 (two) times daily.   ipratropium-albuterol 0.5-2.5 (3) MG/3ML Soln Commonly known as:  DUONEB Take 3 mLs by nebulization every 6 (six) hours. Pt also takes as needed   lip balm ointment Apply topically as needed for lip care.   metoprolol tartrate 25 MG tablet Commonly known as:  LOPRESSOR Take 0.5 tablets (12.5 mg total) by mouth 2 (two) times daily. What changed:  when to take this   multivitamin with minerals Tabs tablet Take 1 tablet by mouth daily. Start taking on:  March 28, 2018   NUTRITIONAL SUPPLEMENT PLUS Liqd Take 30 mLs by mouth daily. Med pass   omeprazole 20 MG capsule Commonly known as:  PRILOSEC Take 20 mg by mouth every morning.   polyethylene glycol packet Commonly known as:  MIRALAX / GLYCOLAX Take 17 g by mouth daily.   PROAIR HFA 108 (90 Base) MCG/ACT inhaler Generic drug:  albuterol Inhale 2 puffs into the lungs every 6 (six) hours as needed for wheezing or shortness of breath.   traMADol 50 MG tablet Commonly known as:  ULTRAM Take 1 tablet (50 mg total) by mouth every 12 (twelve) hours as needed for moderate pain. What changed:    when to take this  reasons to take this  additional instructions   traZODone 50 MG tablet Commonly known as:  DESYREL Take 0.5 tablets (25 mg total) by mouth at bedtime as needed for sleep.       Allergies  Allergen Reactions  . Iodine Hives, Other (See Comments) and Shortness Of Breath    Throat swells  . Iodinated Diagnostic Agents   . Penicillins Hives    Has patient had a PCN reaction causing immediate rash, facial/tongue/throat swelling, SOB or lightheadedness with hypotension: Yes Has patient had a PCN reaction causing severe rash involving mucus membranes or skin necrosis: No Has patient had a PCN reaction that required hospitalization: No Has patient had a PCN reaction occurring within the last 10 years: No If all of the above answers are "NO", then may  proceed with Cephalosporin use.     Consultations:  General surgery   Procedures/Studies: US Paracentesis  Result Date: 03/17/2018 INDICATION: Ascites. EXAM: ULTRASOUND GUIDED PARACENTESIS MEDICATIONS: None. COMPLICATIONS: None immediate. PROCEDURE: Informed written consent was obtained from the patient after a discussion of the risks, benefits and alternatives to treatment. A timeout was performed prior to the initiation of the procedure. Initial ultrasound was performed to localize ascites. The right lower abdomen was prepped and draped in the usual sterile fashion. 1% lidocaine was used for local anesthesia. Following this, a 6 Fr Safe-T-Centesis catheter was introduced. An ultrasound image was saved for documentation purposes. The paracentesis was performed. The catheter was removed and a dressing was applied. The patient tolerated the procedure well without immediate post procedural complication. FINDINGS: A  total of approximately 2 L of fluid was removed. Samples were sent to the laboratory as requested by the clinical team. IMPRESSION: Successful ultrasound-guided paracentesis yielding 2 liters of peritoneal fluid. Electronically Signed   By: Aletta Edouard M.D.   On: 03/17/2018 18:33   Dg Chest Port 1 View  Result Date: 03/04/2018 CLINICAL DATA:  Aspiration. EXAM: PORTABLE CHEST 1 VIEW COMPARISON:  03/04/2018 FINDINGS: 1844 hours. The cardio pericardial silhouette is enlarged. Diffuse interstitial and alveolar opacity remains compatible with edema. Small bilateral pleural effusions are stable. Persistent bibasilar atelectasis or infiltrate. Left-sided permanent pacemaker evident. Telemetry leads overlie the chest. IMPRESSION: No substantial interval change in exam. Electronically Signed   By: Misty Stanley M.D.   On: 03/04/2018 19:27   Dg Chest Port 1 View  Result Date: 03/04/2018 CLINICAL DATA:  Shortness of breath. EXAM: PORTABLE CHEST 1 VIEW COMPARISON:  One-view chest x-ray  12/27/2017 FINDINGS: Heart is enlarged. Interstitial edema and bilateral effusions are present. Bibasilar airspace disease likely reflects atelectasis aortic atherosclerosis is noted. No other airspace consolidation is present. Pacing wire is stable. IMPRESSION: 1. Congestive heart failure. 2. Bibasilar airspace disease likely reflects atelectasis. Electronically Signed   By: San Morelle M.D.   On: 03/04/2018 07:01   [place] Ascites (abdomen Limited)  Result Date: 03/16/2018 CLINICAL DATA:  Anasarca EXAM: LIMITED ABDOMEN ULTRASOUND FOR ASCITES TECHNIQUE: Limited ultrasound survey for ascites was performed in all four abdominal quadrants. COMPARISON:  None. FINDINGS: Ascites is present in all 4 quadrants of the abdomen. Moderate amount of ascites is present. Incidental right pleural effusion is also noted. IMPRESSION: Moderate ascites in all 4 quadrants of the abdomen. Incidental right pleural effusion is noted. Electronically Signed   By: Marybelle Killings M.D.   On: 03/16/2018 08:39   US Abdomen Limited Ruq  Result Date: 03/18/2018 CLINICAL DATA:  Cirrhosis. EXAM: ULTRASOUND ABDOMEN LIMITED RIGHT UPPER QUADRANT COMPARISON:  None. FINDINGS: Gallbladder: Gallbladder is nearly completely filled with sludge. No gallstones seen. Gallbladder wall is mildly thickened circumferentially. Common bile duct: Diameter: 5.5 mm. Liver: Liver echotexture is diffusely coarsened and the peripheral contours are diffusely nodular, compatible with the given history of cirrhosis. No focal mass or lesion is appreciated within the liver. Portal vein is patent on color Doppler imaging with normal direction of blood flow towards the liver. Ascites noted in the RIGHT upper quadrant. IMPRESSION: 1. Cirrhotic liver. 2. Gallbladder is nearly completely filled with sludge. Gallbladder walls are mildly thickened, likely secondary to the sludge and/or adjacent liver disease. 3. Ascites. Electronically Signed   By: Franki Cabot M.D.   On:  03/18/2018 09:33    (Echo, Carotid, EGD, Colonoscopy, ERCP)    Subjective: Awake alert anxious to be discharged denies any new complaints  Discharge Exam: Vitals:   03/27/18 0630 03/27/18 1311  BP: 102/71 102/67  Pulse: (!) 101 82  Resp: 18 19  Temp: 98.3 F (36.8 C) 98.2 F (36.8 C)  SpO2: 95% 100%   Vitals:   03/26/18 2138 03/26/18 2200 03/27/18 0630 03/27/18 1311  BP: 95/68  102/71 102/67  Pulse: (!) 103 72 (!) 101 82  Resp: 16  18 19   Temp: 98.3 F (36.8 C)  98.3 F (36.8 C) 98.2 F (36.8 C)  TempSrc: Oral   Oral  SpO2: 93%  95% 100%  Weight:   103.4 kg   Height:        General: Pt is alert, awake, not in acute distress Cardiovascular: RRR, S1/S2 +, no rubs, no gallops  Respiratory: CTA bilaterally, no wheezing, no rhonchi Abdominal: Soft, NT, ND, bowel sounds + Extremities: 1+ pitting edema bilaterally   The results of significant diagnostics from this hospitalization (including imaging, microbiology, ancillary and laboratory) are listed below for reference.     Microbiology: No results found for this or any previous visit (from the past 240 hour(s)).   Labs: BNP (last 3 results) Recent Labs    12/27/17 1258 03/04/18 0844  BNP 540.2* 588.3*   Basic Metabolic Panel: Recent Labs  Lab 03/21/18 0435 03/22/18 0458 03/23/18 0435 03/24/18 0405 03/26/18 0444 03/27/18 1047  NA 134* 133* 133* 132* 134* 133*  K 3.9 3.8 3.9 3.9 4.0 3.6  CL 96* 96* 96* 96* 96* 97*  CO2 29 28 27 28 30 29   GLUCOSE 87 91 89 83 100* 118*  BUN 39* 41* 47* 51* 46* 46*  CREATININE 1.08* 1.21* 1.31* 1.36* 1.10* 1.05*  CALCIUM 8.9 9.1 8.9 8.7* 9.0 9.1  MG 1.9 1.9  --   --   --   --    Liver Function Tests: Recent Labs  Lab 03/21/18 0435 03/26/18 0444 03/27/18 1047  AST  --  21 23  ALT  --  6 8  ALKPHOS  --  60 65  BILITOT  --  2.1* 1.9*  PROT  --  5.2* 5.7*  ALBUMIN 1.9* 1.9* 2.0*   No results for input(s): LIPASE, AMYLASE in the last 168 hours. No results for  input(s): AMMONIA in the last 168 hours. CBC: Recent Labs  Lab 03/22/18 0458 03/22/18 1757 03/23/18 0435 03/24/18 0405 03/26/18 0444  WBC  --  9.3 9.0 8.5 7.2  HGB 8.3* 7.7* 7.5* 8.4* 9.0*  HCT 27.3* 25.0* 24.0* 27.3* 29.2*  MCV  --  92.9 94.5 91.3 92.4  PLT  --  236 226 258 302   Cardiac Enzymes: No results for input(s): CKTOTAL, CKMB, CKMBINDEX, TROPONINI in the last 168 hours. BNP: Invalid input(s): POCBNP CBG: Recent Labs  Lab 03/26/18 1145 03/26/18 1621 03/26/18 2137 03/27/18 0747 03/27/18 1139  GLUCAP 101* 112* 106* 80 108*   D-Dimer No results for input(s): DDIMER in the last 72 hours. Hgb A1c No results for input(s): HGBA1C in the last 72 hours. Lipid Profile No results for input(s): CHOL, HDL, LDLCALC, TRIG, CHOLHDL, LDLDIRECT in the last 72 hours. Thyroid function studies No results for input(s): TSH, T4TOTAL, T3FREE, THYROIDAB in the last 72 hours.  Invalid input(s): FREET3 Anemia work up No results for input(s): VITAMINB12, FOLATE, FERRITIN, TIBC, IRON, RETICCTPCT in the last 72 hours. Urinalysis No results found for: COLORURINE, APPEARANCEUR, LABSPEC, [place], GLUCOSEU, HGBUR, BILIRUBINUR, KETONESUR, PROTEINUR, UROBILINOGEN, NITRITE, LEUKOCYTESUR Sepsis Labs Invalid input(s): PROCALCITONIN,  WBC,  LACTICIDVEN Microbiology No results found for this or any previous visit (from the past 240 hour(s)).   Time coordinating discharge:  33  minutes  SIGNED:   Georgette Shell, MD  [facility] 03/27/2018, 4:15 PM Pager   If 7PM-7AM, please contact night-coverage [email or web address] Password [email or web address]

## 2018-03-27 NOTE — Progress Notes (Signed)
[facility] Progress Note     Subjective: CC: sacral wound Seen prior to hydrotherapy. Patient reports some dysuria as well.   Objective: Vital signs in last 24 hours: Temp:  [98.3 F (36.8 C)-98.6 F (37 C)] 98.3 F (36.8 C) (02/03 0630) Pulse Rate:  [72-103] 101 (02/03 0630) Resp:  [16-20] 18 (02/03 0630) BP: (95-119)/(65-71) 102/71 (02/03 0630) SpO2:  [93 %-98 %] 95 % (02/03 0630) Weight:  [103.4 kg] 103.4 kg (02/03 0630) Last BM Date: 03/24/18  Intake/Output from previous day: 02/02 0701 - 02/03 0700 In: 355.2 [I.V.:355.2] Out: 650 [Urine:650] Intake/Output this shift: Total I/O In: 240 [P.O.:240] Out: 200 [Urine:200]  PE: Gen:  Alert, NAD Skin: sacral wound as below with some fibrinous tissue but overall improving      Lab Results:  Recent Labs    03/26/18 0444  WBC 7.2  HGB 9.0*  HCT 29.2*  PLT 302   BMET Recent Labs    03/26/18 0444  NA 134*  K 4.0  CL 96*  CO2 30  GLUCOSE 100*  BUN 46*  CREATININE 1.10*  CALCIUM 9.0   PT/INR No results for input(s): LABPROT, INR in the last 72 hours. CMP     Component Value Date/Time   NA 134 (L) 03/26/2018 0444   K 4.0 03/26/2018 0444   CL 96 (L) 03/26/2018 0444   CO2 30 03/26/2018 0444   GLUCOSE 100 (H) 03/26/2018 0444   BUN 46 (H) 03/26/2018 0444   CREATININE 1.10 (H) 03/26/2018 0444   CALCIUM 9.0 03/26/2018 0444   PROT 5.2 (L) 03/26/2018 0444   ALBUMIN 1.9 (L) 03/26/2018 0444   AST 21 03/26/2018 0444   ALT 6 03/26/2018 0444   ALKPHOS 60 03/26/2018 0444   BILITOT 2.1 (H) 03/26/2018 0444   GFRNONAA 50 (L) 03/26/2018 0444   GFRAA 58 (L) 03/26/2018 0444   Lipase  No results found for: LIPASE     Studies/Results: No results found.  Anti-infectives: Anti-infectives (From admission, onward)   None       Assessment/Plan Sacral decub - continue hydrotherapy and BID dressing changes with santyl, saline dampened kerlex, and dry ABD pad  FEN: heart healthy VTE:  SCD's, Eliquis ID: none Follow up: wound clinic  DISPO: Continue hydrotherapy and santyl. Stable for discharge if going to SNF that is able to complete hydrotherapy. Check UA for dysuria.   LOS: 23 days    Brigid Re , [facility] 03/27/2018, 11:04 AM Pager: [phone] Consults: [phone] Mon-Fri 7:00 am-4:30 pm Sat-Sun 7:00 am-11:30 am

## 2018-03-27 NOTE — Progress Notes (Signed)
Pt admitting to [facility] room 107[address] [place] Medicare approved admission. [facility] transportation arranged. Pt's niece assisted with admission paperwork for SNF.   Sharren Bridge, MSW, LCSW Clinical Social Work 03/27/2018 [phone]

## 2018-03-27 NOTE — Progress Notes (Addendum)
03/27/18 1506 Hydrotherapy  Treatment note  Subjective Assessment  Subjective Pt reports pain with movement and pressure of PLS.   Patient and Family Stated Goals wound healing  Prior Treatments status post bedside debridement 03/23/2018  Evaluation and Treatment  Evaluation and Treatment Procedures Explained to Patient/Family Yes  Evaluation and Treatment Procedures agreed to  Pressure Injury 03/24/18 Unstageable - Full thickness tissue loss in which the base of the ulcer is covered by slough (yellow, tan, gray, green or brown) and/or eschar (tan, brown or black) in the wound bed. HYDROTHERAPY  Date First Assessed/Time First Assessed: 03/24/18 1430   Location: Sacrum  Location Orientation: Mid  Staging: Unstageable - Full thickness tissue loss in which the base of the ulcer is covered by slough (yellow, tan, gray, green or brown) and/or esch...  Dressing Type Gauze (Comment);;Moist to dry, pressure over  The top due to bleeding.  (santyl)  Dressing Changed  Dressing Change Frequency Twice a day  State of Healing Eschar  Site / Wound Assessment Bleeding;Red;Yellow;Painful  % Wound base Red or Granulating 55%  % Wound base Yellow/Fibrinous Exudate 45%  Peri-wound Assessment Erythema (blanchable);Pink  Wound Length (cm) 11 cm  Wound Width (cm) 6 cm  Wound Depth (cm) 3.5 cm  Wound Surface Area (cm^2) 66 cm^2  Wound Volume (cm^3) 231 cm^3  Undermining (cm)  (1 at 12:00 after removed eschar, 3 cm at 6 oclock 2 cm at 9 )  Margins Unattached edges (unapproximated)  Drainage Amount Moderate  Drainage Description Purulent;Serosanguineous  Treatment Hydrotherapy (Pulse lavage);Packing (Saline gauze);Debridement (Selective)  Hydrotherapy  Pulsed lavage therapy - wound location sacral pressure injury  Pulsed Lavage with Suction (psi) 8 psi  Pulsed Lavage with Suction - Normal Saline Used  (700, painful so stopped)  Pulsed Lavage Tip Tip with splash shield  Selective Debridement  Selective  Debridement - Location sacral pressure injury  Selective Debridement - Tools Used Forceps;Scissors  Selective Debridement - Tissue Removed dark slough at 12 oclock. noted bleeding from area but not at site of ts. debrided  Wound Therapy - Assess/Plan/Recommendations  Wound Therapy - Clinical Statement Debrided small 1 cm area at 12 oclock with  increased bleeding. Pressure held and dressing secured tightly across the area. . RN notified that  area may bleed. Patient reports significant pain involved with the  Debridement, PLS and repositioning. Continue  Hydrotherapy/ to facilitate necrotic tissue removal.   CCS in to inspect wound.  Wound Therapy - Functional Problem List Pt wound benefit from hydrotherapy to assist with wound healing and educating on repositioning technique.  Factors Delaying/Impairing Wound Healing Diabetes Mellitus;Immobility;Multiple medical problems  Hydrotherapy Plan Debridement;Dressing change;Patient/family education;Pulsatile lavage with suction  Wound Therapy - Frequency 6X / week  Wound Therapy - Follow Up Recommendations Skilled nursing facility  Wound Plan Continue hydrotherapy as outlined above to promote wound healing  Wound Therapy Goals - Improve the function of patient's integumentary system by progressing the wound(s) through the phases of wound healing by:  Decrease Necrotic Tissue to 20  Decrease Necrotic Tissue - Progress Progressing toward goal  Increase Granulation Tissue to 80  Increase Granulation Tissue - Progress Progressing toward goal  Improve Drainage Characteristics Mod  Improve Drainage Characteristics - Progress Progressing toward goal  Patient/Family will be able to  Pt will be able to state appropriate repositioning techniques.  Patient/Family Instruction Goal - Progress Progressing toward goal  Goals/treatment plan/discharge plan were made with and agreed upon by patient/family Yes  Time For Goal Achievement 2  weeks  Wound Therapy -  Potential for Goals [place] Pager [phone] Office [phone]

## 2018-03-27 NOTE — Progress Notes (Signed)
Pt's disposition plan adjusted due to need for hydrotherapy. [facility] SN[facility] now seeking insurance authorization for pt to admission there.  Pt's niece updated.   Sharren Bridge, MSW, LCSW Clinical Social Work 03/27/2018 [phone]

## 2018-03-27 NOTE — Progress Notes (Signed)
Report given to [place] at [facility]

## 2018-04-23 ENCOUNTER — Emergency Department (HOSPITAL_COMMUNITY): Payer: 59

## 2018-04-23 ENCOUNTER — Other Ambulatory Visit: Payer: Self-pay

## 2018-04-23 ENCOUNTER — Inpatient Hospital Stay (HOSPITAL_COMMUNITY)
Admission: EM | Admit: 2018-04-23 | Discharge: 2018-04-28 | DRG: 393 | Disposition: A | Discharge status: Skilled Nursing Facility | Payer: 59 | Attending: Family Medicine | Admitting: Family Medicine

## 2018-04-23 ENCOUNTER — Encounter (HOSPITAL_COMMUNITY): Payer: Self-pay

## 2018-04-23 DIAGNOSIS — Z7901 Long term (current) use of anticoagulants: Secondary | ICD-10-CM | POA: Diagnosis not present

## 2018-04-23 DIAGNOSIS — K5900 Constipation, unspecified: Secondary | ICD-10-CM | POA: Diagnosis present

## 2018-04-23 DIAGNOSIS — L8990 Pressure ulcer of unspecified site, unspecified stage: Secondary | ICD-10-CM | POA: Diagnosis present

## 2018-04-23 DIAGNOSIS — K922 Gastrointestinal hemorrhage, unspecified: Secondary | ICD-10-CM | POA: Diagnosis present

## 2018-04-23 DIAGNOSIS — K625 Hemorrhage of anus and rectum: Secondary | ICD-10-CM

## 2018-04-23 DIAGNOSIS — R339 Retention of urine, unspecified: Secondary | ICD-10-CM | POA: Diagnosis present

## 2018-04-23 DIAGNOSIS — I071 Rheumatic tricuspid insufficiency: Secondary | ICD-10-CM | POA: Diagnosis present

## 2018-04-23 DIAGNOSIS — I5032 Chronic diastolic (congestive) heart failure: Secondary | ICD-10-CM | POA: Diagnosis present

## 2018-04-23 DIAGNOSIS — D631 Anemia in chronic kidney disease: Secondary | ICD-10-CM | POA: Diagnosis present

## 2018-04-23 DIAGNOSIS — R109 Unspecified abdominal pain: Secondary | ICD-10-CM

## 2018-04-23 DIAGNOSIS — D649 Anemia, unspecified: Secondary | ICD-10-CM | POA: Diagnosis not present

## 2018-04-23 DIAGNOSIS — L89154 Pressure ulcer of sacral region, stage 4: Secondary | ICD-10-CM | POA: Diagnosis present

## 2018-04-23 DIAGNOSIS — D62 Acute posthemorrhagic anemia: Secondary | ICD-10-CM | POA: Diagnosis present

## 2018-04-23 DIAGNOSIS — I482 Chronic atrial fibrillation, unspecified: Secondary | ICD-10-CM | POA: Diagnosis present

## 2018-04-23 DIAGNOSIS — E1122 Type 2 diabetes mellitus with diabetic chronic kidney disease: Secondary | ICD-10-CM | POA: Diagnosis present

## 2018-04-23 DIAGNOSIS — Z90710 Acquired absence of both cervix and uterus: Secondary | ICD-10-CM

## 2018-04-23 DIAGNOSIS — R188 Other ascites: Secondary | ICD-10-CM | POA: Diagnosis present

## 2018-04-23 DIAGNOSIS — I10 Essential (primary) hypertension: Secondary | ICD-10-CM | POA: Diagnosis present

## 2018-04-23 DIAGNOSIS — I130 Hypertensive heart and chronic kidney disease with heart failure and stage 1 through stage 4 chronic kidney disease, or unspecified chronic kidney disease: Secondary | ICD-10-CM | POA: Diagnosis present

## 2018-04-23 DIAGNOSIS — K219 Gastro-esophageal reflux disease without esophagitis: Secondary | ICD-10-CM | POA: Diagnosis present

## 2018-04-23 DIAGNOSIS — K449 Diaphragmatic hernia without obstruction or gangrene: Secondary | ICD-10-CM | POA: Diagnosis present

## 2018-04-23 DIAGNOSIS — I959 Hypotension, unspecified: Secondary | ICD-10-CM | POA: Diagnosis present

## 2018-04-23 DIAGNOSIS — K633 Ulcer of intestine: Secondary | ICD-10-CM | POA: Diagnosis present

## 2018-04-23 DIAGNOSIS — Z950 Presence of cardiac pacemaker: Secondary | ICD-10-CM

## 2018-04-23 DIAGNOSIS — Z880 Allergy status to penicillin: Secondary | ICD-10-CM

## 2018-04-23 DIAGNOSIS — Z91041 Radiographic dye allergy status: Secondary | ICD-10-CM

## 2018-04-23 DIAGNOSIS — K7460 Unspecified cirrhosis of liver: Secondary | ICD-10-CM | POA: Diagnosis present

## 2018-04-23 DIAGNOSIS — N183 Chronic kidney disease, stage 3 (moderate): Secondary | ICD-10-CM | POA: Diagnosis present

## 2018-04-23 DIAGNOSIS — Z8249 Family history of ischemic heart disease and other diseases of the circulatory system: Secondary | ICD-10-CM

## 2018-04-23 DIAGNOSIS — Z833 Family history of diabetes mellitus: Secondary | ICD-10-CM

## 2018-04-23 DIAGNOSIS — Z79899 Other long term (current) drug therapy: Secondary | ICD-10-CM

## 2018-04-23 DIAGNOSIS — K642 Third degree hemorrhoids: Secondary | ICD-10-CM | POA: Diagnosis present

## 2018-04-23 DIAGNOSIS — B373 Candidiasis of vulva and vagina: Secondary | ICD-10-CM | POA: Diagnosis present

## 2018-04-23 DIAGNOSIS — E785 Hyperlipidemia, unspecified: Secondary | ICD-10-CM | POA: Diagnosis present

## 2018-04-23 DIAGNOSIS — K5731 Diverticulosis of large intestine without perforation or abscess with bleeding: Secondary | ICD-10-CM | POA: Diagnosis present

## 2018-04-23 LAB — CBC WITH DIFFERENTIAL/PLATELET
Abs Immature Granulocytes: 0.06 10*3/uL (ref 0.00–0.07)
BASOS PCT: 0 %
Basophils Absolute: 0 10*3/uL (ref 0.0–0.1)
EOS ABS: 0.2 10*3/uL (ref 0.0–0.5)
Eosinophils Relative: 1 %
HCT: 25.6 % — ABNORMAL LOW (ref 36.0–46.0)
Hemoglobin: 7.7 g/dL — ABNORMAL LOW (ref 12.0–15.0)
Immature Granulocytes: 1 %
Lymphocytes Relative: 14 %
Lymphs Abs: 1.7 10*3/uL (ref 0.7–4.0)
MCH: 28.6 pg (ref 26.0–34.0)
MCHC: 30.1 g/dL (ref 30.0–36.0)
MCV: 95.2 fL (ref 80.0–100.0)
Monocytes Absolute: 1.1 10*3/uL — ABNORMAL HIGH (ref 0.1–1.0)
Monocytes Relative: 9 %
Neutro Abs: 9.2 10*3/uL — ABNORMAL HIGH (ref 1.7–7.7)
Neutrophils Relative %: 75 %
Platelets: 495 10*3/uL — ABNORMAL HIGH (ref 150–400)
RBC: 2.69 MIL/uL — ABNORMAL LOW (ref 3.87–5.11)
RDW: 17.8 % — ABNORMAL HIGH (ref 11.5–15.5)
WBC: 12.3 10*3/uL — ABNORMAL HIGH (ref 4.0–10.5)
nRBC: 0 % (ref 0.0–0.2)

## 2018-04-23 LAB — COMPREHENSIVE METABOLIC PANEL
ALT: 15 U/L (ref 0–44)
AST: 69 U/L — ABNORMAL HIGH (ref 15–41)
Albumin: 1.9 g/dL — ABNORMAL LOW (ref 3.5–5.0)
Alkaline Phosphatase: 93 U/L (ref 38–126)
Anion gap: 8 (ref 5–15)
BUN: 37 mg/dL — AB (ref 8–23)
CO2: 25 mmol/L (ref 22–32)
Calcium: 8.5 mg/dL — ABNORMAL LOW (ref 8.9–10.3)
Chloride: 101 mmol/L (ref 98–111)
Creatinine, Ser: 1.31 mg/dL — ABNORMAL HIGH (ref 0.44–1.00)
GFR calc Af Amer: 47 mL/min — ABNORMAL LOW (ref >60)
GFR, EST NON AFRICAN AMERICAN: 41 mL/min — AB (ref >60)
Glucose, Bld: 94 mg/dL (ref 70–99)
Potassium: 4.1 mmol/L (ref 3.5–5.1)
Sodium: 134 mmol/L — ABNORMAL LOW (ref 135–145)
Total Bilirubin: 2.4 mg/dL — ABNORMAL HIGH (ref 0.3–1.2)
Total Protein: 5.4 g/dL — ABNORMAL LOW (ref 6.5–8.1)

## 2018-04-23 LAB — PREPARE RBC (CROSSMATCH)

## 2018-04-23 LAB — PROTIME-INR
INR: 2.1 — ABNORMAL HIGH (ref 0.8–1.2)
Prothrombin Time: 23 seconds — ABNORMAL HIGH (ref 11.4–15.2)

## 2018-04-23 LAB — HEPARIN LEVEL (UNFRACTIONATED): Heparin Unfractionated: >2.2 IU/mL — ABNORMAL HIGH (ref 0.30–0.70)

## 2018-04-23 LAB — LIPASE, BLOOD: Lipase: 39 U/L (ref 11–51)

## 2018-04-23 LAB — POC OCCULT BLOOD, ED: Fecal Occult Bld: POSITIVE — AB

## 2018-04-23 MED ORDER — DILTIAZEM HCL 60 MG PO TABS
60.0000 mg | ORAL_TABLET | Freq: Four times a day (QID) | ORAL | Status: DC
  Administered 2018-04-23 – 2018-04-28 (×19): 60 mg via ORAL
  Filled 2018-04-23 (×20): qty 1

## 2018-04-23 MED ORDER — IPRATROPIUM-ALBUTEROL 0.5-2.5 (3) MG/3ML IN SOLN: 3.0000 mL | Freq: Four times a day (QID) | RESPIRATORY_TRACT | Status: DC | PRN

## 2018-04-23 MED ORDER — SODIUM CHLORIDE 0.9 % IV BOLUS
1000.0000 mL | Freq: Once | INTRAVENOUS | Status: AC
  Administered 2018-04-23: 1000 mL via INTRAVENOUS

## 2018-04-23 MED ORDER — ACETAMINOPHEN 650 MG RE SUPP: 650.0000 mg | Freq: Four times a day (QID) | RECTAL | Status: DC | PRN

## 2018-04-23 MED ORDER — ONDANSETRON HCL 4 MG/2ML IJ SOLN: 4.0000 mg | Freq: Four times a day (QID) | INTRAMUSCULAR | Status: DC | PRN

## 2018-04-23 MED ORDER — CIPROFLOXACIN IN D5W 400 MG/200ML IV SOLN
400.0000 mg | Freq: Two times a day (BID) | INTRAVENOUS | Status: DC
  Administered 2018-04-23: 400 mg via INTRAVENOUS
  Filled 2018-04-23: qty 200

## 2018-04-23 MED ORDER — PROTHROMBIN COMPLEX CONC HUMAN 500 UNITS IV KIT
4516.0000 [IU] | PACK | Status: AC
  Administered 2018-04-23: 4516 [IU] via INTRAVENOUS
  Filled 2018-04-23: qty 3516

## 2018-04-23 MED ORDER — SODIUM CHLORIDE 0.9 % IV SOLN
8.0000 mg/h | INTRAVENOUS | Status: DC
  Administered 2018-04-23: 8 mg/h via INTRAVENOUS
  Filled 2018-04-23 (×2): qty 80

## 2018-04-23 MED ORDER — METOPROLOL TARTRATE 12.5 MG HALF TABLET
12.5000 mg | ORAL_TABLET | Freq: Two times a day (BID) | ORAL | Status: DC
  Administered 2018-04-23 – 2018-04-28 (×9): 12.5 mg via ORAL
  Filled 2018-04-23 (×9): qty 1

## 2018-04-23 MED ORDER — SODIUM CHLORIDE 0.9 % IV SOLN
10.0000 mL/h | Freq: Once | INTRAVENOUS | Status: AC
  Administered 2018-04-24: 10 mL/h via INTRAVENOUS

## 2018-04-23 MED ORDER — TRAZODONE HCL 50 MG PO TABS
25.0000 mg | ORAL_TABLET | Freq: Every day | ORAL | Status: DC
  Administered 2018-04-23 – 2018-04-27 (×5): 25 mg via ORAL
  Filled 2018-04-23 (×5): qty 1

## 2018-04-23 MED ORDER — ACETAMINOPHEN 325 MG PO TABS
650.0000 mg | ORAL_TABLET | Freq: Four times a day (QID) | ORAL | Status: DC | PRN
  Administered 2018-04-23 – 2018-04-24 (×2): 650 mg via ORAL
  Filled 2018-04-23 (×2): qty 2

## 2018-04-23 MED ORDER — SODIUM CHLORIDE 0.9 % IV SOLN
80.0000 mg | Freq: Once | INTRAVENOUS | Status: AC
  Administered 2018-04-23: 80 mg via INTRAVENOUS
  Filled 2018-04-23: qty 80

## 2018-04-23 MED ORDER — ONDANSETRON HCL 4 MG PO TABS: 4.0000 mg | ORAL_TABLET | Freq: Four times a day (QID) | ORAL | Status: DC | PRN

## 2018-04-23 MED ORDER — PROTHROMBIN COMPLEX CONC HUMAN 500 UNITS IV KIT
5055.0000 [IU] | PACK | Status: DC
  Filled 2018-04-23 (×2): qty 5055

## 2018-04-23 MED ORDER — PANTOPRAZOLE SODIUM 40 MG IV SOLR: 40.0000 mg | Freq: Two times a day (BID) | INTRAVENOUS | Status: DC

## 2018-04-23 NOTE — ED Provider Notes (Signed)
[facility] DEPT Provider Note   CSN: [id number] Arrival date & time: 04/23/18  1523    History   Chief Complaint Chief Complaint  Patient presents with  . Rectal Bleeding    HPI Latasha Guzman is a 73 y.o. female.     The history is provided by the patient and medical records. No language interpreter was used.  Rectal Bleeding     73 year old female with history of atrial fibrillation currently on Eliquis, CHF, diabetes, hypertension, GERD, recurrent GI bleed.  Patient states for the past week she has been very constipated.  At the nursing facility she received copious amount of stool softener and within the past 3 days she has had copious amount of stool.  Today she developed rectal bleeding with bright red blood.  Nursing staff was very concerned and sent patient here.  She felt her bleeding is likely from hemorrhoids.  She denies any significant fever, chills, headache, lightheadedness, dizziness, chest pain, trouble breathing, abdominal pain, rectal pain.  She is currently on Eliquis.  She mentioned having rectal bleeding like this in the past. sts her baseline Hgb is 7.4.  Did report mild diffused abd discomfort for the past few days from straining.    Past Medical History:  Diagnosis Date  . Acute blood loss anemia 08/02/2017  . Atrial fibrillation ([place])   . Chronic atrial fibrillation 10/15/2015  . Chronic diastolic CHF (congestive heart failure) ([place])   . Diabetes mellitus ([place]) 07/27/2017  . Edema   . Essential hypertension 10/15/2015  . GERD (gastroesophageal reflux disease) 07/27/2017  . GI bleed 07/24/2017  . Hyperlipidemia   . Pacemaker 10/15/2015  . Pneumonia of right lower lobe due to infectious organism ([place]) 08/02/2017  . Skin rash 08/02/2017  . Type 2 diabetes mellitus without complication ([place]) 6/96/2952    Patient Active Problem List   Diagnosis Date Noted  . Anasarca   . Moderate malnutrition ([place]) 03/21/2018  . Hematochezia   .  GI bleed 03/04/2018  . Acute diastolic CHF (congestive heart failure) ([place]) 03/04/2018  . Acute GI bleeding 03/04/2018  . Pressure injury of skin 03/04/2018  . Shortness of breath   . A-fib ([place]) 12/27/2017  . Atrial fibrillation with RVR ([place]) 12/27/2017  . Chronic diastolic CHF (congestive heart failure) ([place]) 12/27/2017  . Lymphedema of both lower extremities 12/27/2017  . Hyperlipidemia associated with type 2 diabetes mellitus ([place]) 12/27/2017  . Acute blood loss anemia 08/02/2017  . Pneumonia of right lower lobe due to infectious organism ([place]) 08/02/2017  . Skin rash 08/02/2017  . Diabetes mellitus ([place]) 07/27/2017  . GERD (gastroesophageal reflux disease) 07/27/2017  . History of GI bleed 07/24/2017  . Chronic atrial fibrillation 10/15/2015  . Essential hypertension 10/15/2015  . Pacemaker 10/15/2015  . Type 2 diabetes mellitus without complication ([place]) 84/13/2440    Past Surgical History:  Procedure Laterality Date  . ABDOMINAL HYSTERECTOMY  1995   Total  . AV FISTULA REPAIR    . CATARACT EXTRACTION    . GIVENS CAPSULE STUDY N/A 12/30/2017   Procedure: GIVENS CAPSULE STUDY;  Surgeon: Ronnette Juniper, MD;  Location: [facility];  Service: Gastroenterology;  Laterality: N/A;  . PACEMAKER INSERTION  10/30/2015   By Dr. Agustin Cree     OB History   No obstetric history on file.      Home Medications    Prior to Admission medications   Medication Sig Start Date End Date Taking? Authorizing Provider  apixaban (ELIQUIS) 5 MG  TABS tablet Take 5 mg by mouth 2 (two) times daily.     [provider]  atorvastatin (LIPITOR) 80 MG tablet Take 80 mg by mouth at bedtime.  03/23/17   [provider]  cholecalciferol (VITAMIN D3) 25 MCG (1000 UT) tablet Take 1,000 Units by mouth daily.    [provider]  collagenase (SANTYL) ointment Apply topically daily. 03/27/18   Georgette Shell, MD  diltiazem (CARDIZEM) 60 MG tablet Take 1 tablet (60 mg total) by  mouth every 6 (six) hours. 03/27/18   Georgette Shell, MD  fenofibrate 160 MG tablet Take 160 mg by mouth every morning.  03/23/17   [provider]  furosemide (LASIX) 40 MG tablet Take 40 mg by mouth daily.    [provider]  hydrocortisone (ANUSOL-HC) 2.5 % rectal cream Place rectally 2 (two) times daily. 03/27/18   Georgette Shell, MD  ipratropium-albuterol (DUONEB) 0.5-2.5 (3) MG/3ML SOLN Take 3 mLs by nebulization every 6 (six) hours. Pt also takes as needed 02/28/18 03/06/18  [provider]  lip balm (CARMEX) ointment Apply topically as needed for lip care. 03/27/18   Georgette Shell, MD  metoprolol tartrate (LOPRESSOR) 25 MG tablet Take 0.5 tablets (12.5 mg total) by mouth 2 (two) times daily. 03/27/18   Georgette Shell, MD  Multiple Vitamin (MULTIVITAMIN WITH MINERALS) TABS tablet Take 1 tablet by mouth daily. 03/28/18   Georgette Shell, MD  Nutritional Supplements (NUTRITIONAL SUPPLEMENT PLUS) LIQD Take 30 mLs by mouth daily. Med pass    [provider]  omeprazole (PRILOSEC) 20 MG capsule Take 20 mg by mouth every morning.  03/23/17   [provider]  polyethylene glycol (MIRALAX / GLYCOLAX) packet Take 17 g by mouth daily.    [provider]  PROAIR HFA 108 [phone] Base) MCG/ACT inhaler Inhale 2 puffs into the lungs every 6 (six) hours as needed for wheezing or shortness of breath.  04/24/17   [provider]  traMADol (ULTRAM) 50 MG tablet Take 1 tablet (50 mg total) by mouth every 12 (twelve) hours as needed for moderate pain. 03/27/18   Georgette Shell, MD  traZODone (DESYREL) 50 MG tablet Take 0.5 tablets (25 mg total) by mouth at bedtime as needed for sleep. 03/27/18   Georgette Shell, MD    Family History Family History  Problem Relation Age of Onset  . Stroke Mother   . Heart disease Mother   . Diabetes Mother   . Hypertension Mother   . Hyperlipidemia Mother   . Heart attack Father   . Heart  disease Father   . Diabetes Maternal Grandfather   . Hypertension Maternal Grandfather   . Heart attack Paternal Grandfather   . Heart disease Paternal Grandfather     Social History Social History   Tobacco Use  . Smoking status: Never Smoker  . Smokeless tobacco: Never Used  Substance Use Topics  . Alcohol use: Never    Frequency: Never  . Drug use: Never     Allergies   Iodine; Iodinated diagnostic agents; and Penicillins   Review of Systems Review of Systems  Gastrointestinal: Positive for hematochezia.  All other systems reviewed and are negative.    Physical Exam Updated Vital Signs BP 108/70 (BP Location: Left Arm)   Pulse (!) 102   Temp 97.6 F (36.4 C) (Oral)   Resp 20   SpO2 100%   Physical Exam Vitals signs and nursing note reviewed.  Constitutional:  General: She is not in acute distress.    Appearance: She is well-developed.  HENT:     Head: Atraumatic.  Eyes:     Conjunctiva/sclera: Conjunctivae normal.  Neck:     Musculoskeletal: Neck supple.  Cardiovascular:     Rate and Rhythm: Rhythm irregular.  Pulmonary:     Effort: Pulmonary effort is normal.     Breath sounds: Normal breath sounds.  Abdominal:     Palpations: Abdomen is soft.     Tenderness: There is abdominal tenderness (Very mild diffuse tenderness without guarding or rebound tenderness.).  Genitourinary:    Comments: Chaperone present during exam.  Evidence of hemorrhoid however there is copious amount of bright red blood noted on adult diaper without any significant clots.  No obvious mass or thrombosed hemorrhoid. Skin:    Coloration: Skin is pale.     Findings: No rash.  Neurological:     Mental Status: She is alert and oriented to person, place, and time.      ED Treatments / Results  Labs (all labs ordered are listed, but only abnormal results are displayed) Labs Reviewed  CBC WITH DIFFERENTIAL/PLATELET - Abnormal; Notable for the following components:       Result Value   WBC 12.3 (*)    RBC 2.69 (*)    Hemoglobin 7.7 (*)    HCT 25.6 (*)    RDW 17.8 (*)    Platelets 495 (*)    Neutro Abs 9.2 (*)    Monocytes Absolute 1.1 (*)    All other components within normal limits  COMPREHENSIVE METABOLIC PANEL - Abnormal; Notable for the following components:   Sodium 134 (*)    BUN 37 (*)    Creatinine, Ser 1.31 (*)    Calcium 8.5 (*)    Total Protein 5.4 (*)    Albumin 1.9 (*)    AST 69 (*)    Total Bilirubin 2.4 (*)    GFR calc non Af Amer 41 (*)    GFR calc Af Amer 47 (*)    All other components within normal limits  PROTIME-INR - Abnormal; Notable for the following components:   Prothrombin Time 23.0 (*)    INR 2.1 (*)    All other components within normal limits  HEPARIN LEVEL (UNFRACTIONATED) - Abnormal; Notable for the following components:   Heparin Unfractionated >2.20 (*)    All other components within normal limits  POC OCCULT BLOOD, ED - Abnormal; Notable for the following components:   Fecal Occult Bld POSITIVE (*)    All other components within normal limits  LIPASE, BLOOD  URINALYSIS, ROUTINE W REFLEX MICROSCOPIC  TYPE AND SCREEN  PREPARE RBC (CROSSMATCH)    EKG None  Radiology Ct Abdomen Pelvis Wo Contrast  Result Date: 04/23/2018 CLINICAL DATA:  Intermittent GI bleeding EXAM: CT ABDOMEN AND PELVIS WITHOUT CONTRAST TECHNIQUE: Multidetector CT imaging of the abdomen and pelvis was performed following the standard protocol without IV contrast. COMPARISON:  None. FINDINGS: Lower chest: Moderate right and small left pleural effusions. Associated bibasilar atelectasis. Cardiomegaly. Small pericardial effusion. Hypodense blood pool relative to myocardium, suggesting anemia. Pacemaker leads, incompletely visualized. Hepatobiliary: Unenhanced liver is notable for a mildly nodular hepatic contour (series 2/image 24), suggesting cirrhosis. Gallbladder is distended but grossly unremarkable. No intrahepatic or extrahepatic ductal  dilatation. Pancreas: Within normal limits. Spleen: Borderline enlargement of the spleen, measuring 14.1 cm in craniocaudal dimension (coronal image 75). Adrenals/Urinary Tract: Adrenal glands are within normal limits. Kidneys are grossly unremarkable.  No hydronephrosis. Bladder is within normal limits. Stomach/Bowel: Stomach is within normal limits. No evidence of bowel obstruction. Normal appendix (series 2/image 54). Mild rectal wall thickening (series 2/image 78). Vascular/Lymphatic: No evidence of abdominal aortic aneurysm. Atherosclerotic calcifications of the abdominal aorta and branch vessels. No suspicious abdominopelvic lymphadenopathy. Reproductive: Status post hysterectomy. No adnexal masses. Other: Moderate abdominopelvic ascites. Musculoskeletal: Moderate to severe compression fracture deformity at T12, with possible pathologic fracture given the lytic/sclerotic appearance. No retropulsion. IMPRESSION: Mild rectal wall thickening, nonspecific. Correlate for infectious/inflammatory prostatitis. Neoplasm is considered unlikely, but endoscopic correlation is suggested. Cirrhosis with moderate abdominopelvic ascites and borderline splenomegaly. Moderate to severe compression fracture deformity at T12, possibly pathologic. No retropulsion. Moderate right and small left pleural effusions. Electronically Signed   By: Julian Hy M.D.   On: 04/23/2018 17:19    Procedures .Critical Care Performed by: Domenic Moras, PA-C Authorized by: Domenic Moras, PA-C   Critical care provider statement:    Critical care time (minutes):  45   Critical care was time spent personally by me on the following activities:  Discussions with consultants, evaluation of patient's response to treatment, examination of patient, ordering and performing treatments and interventions, ordering and review of laboratory studies, ordering and review of radiographic studies, pulse oximetry, re-evaluation of patient's condition,  obtaining history from patient or surrogate and review of old charts   (including critical care time)  Medications Ordered in ED Medications  0.9 %  sodium chloride infusion (has no administration in time range)  sodium chloride 0.9 % bolus 1,000 mL (0 mLs Intravenous Stopped 04/23/18 1824)  prothrombin complex conc human (KCENTRA) IVPB 4,516 Units (0 Units Intravenous Stopped 04/23/18 1824)     Initial Impression / Assessment and Plan / ED Course  I have reviewed the triage vital signs and the nursing notes.  Pertinent labs & imaging results that were available during my care of the patient were reviewed by me and considered in my medical decision making (see chart for details).        BP 108/70 (BP Location: Left Arm)   Pulse (!) 102   Temp 97.6 F (36.4 C) (Oral)   Resp 20   SpO2 100%    Final Clinical Impressions(s) / ED Diagnoses   Final diagnoses:  Acute GI bleeding  Symptomatic anemia    ED Discharge Orders    None     4:14 PM Patient with history of GI bleed, currently on Eliquis with chest pain that started today.  She is pale appearing, copious bright red blood per rectum were noted diffuse abdominal tenderness.  She has been constipated for the past several days has been receiving stool softener. Initial vital sign showing mild sinus tachycardia with soft BP of 108systolic.  Care discussed with DR. Yelverton  6:20 PM Today hemoglobin is 7.7, it was 9.0 a month ago.  Renal function is at baseline.  Blood product have been ordered.  I have ordered Kcentra to reverse her Eliquis.  From prior notes: 08/10/2017 EGD and colonoscopy at [facility] center.  Per notes from November, 2019 these were unremarkable. 10/2017 colonoscopy and EGD repeated by Dr. Lyda Jester.  Mild diverticular disease, external hemorrhoids, hiatal hernia noted otherwise normal studies. 01/02/2018 Capsule endoscopy.  Normal study.  Read by Dr.Karki in [place], report in Epic.    6:22 PM Will  monitor closely, will consult GI and Medicine for admission.   6:55 PM Appreciate consultation from GI specialist Dr. Lyndel Safe who request medicine for admission.  To trend Hgb, and if necessary she may benefit from abd/pelvis CTA, or sigmocolonoscopy tomorrow  7:44 PM Appreciate consultation from [facility] Dr. Hal Hope who agrees to see and admit pt for further care.    Domenic Moras, PA-C 04/23/18 1945    Julianne Rice, MD 04/24/18 2318

## 2018-04-23 NOTE — Progress Notes (Signed)
Pharmacy Antibiotic Note  Latasha Guzman is a 73 y.o. female admitted on 04/23/2018 with Intra-abdominal infection.  Pharmacy has been consulted for Ciprofloxacin dosing.  Plan: Ciprofloxacin 400mg  iv q12hr  Weight: 225 lb 1.4 oz (102.1 kg)  Temp (24hrs), Avg:97.6 F (36.4 C), Min:97.6 F (36.4 C), Max:97.6 F (36.4 C)  Recent Labs  Lab 04/23/18 1703  WBC 12.3*  CREATININE 1.31*    Estimated Creatinine Clearance: 48.4 mL/min (A) (by C-G formula based on SCr of 1.31 mg/dL (H)).    Allergies  Allergen Reactions  . Iodine Hives, Other (See Comments) and Shortness Of Breath    Throat swells  . Iodinated Diagnostic Agents   . Penicillins Hives    Has patient had a PCN reaction causing immediate rash, facial/tongue/throat swelling, SOB or lightheadedness with hypotension: Yes Has patient had a PCN reaction causing severe rash involving mucus membranes or skin necrosis: No Has patient had a PCN reaction that required hospitalization: No Has patient had a PCN reaction occurring within the last 10 years: No If all of the above answers are "NO", then may proceed with Cephalosporin use.     Antimicrobials this admission: Ciprofloxacin 04/23/2018 >>    Dose adjustments this admission: -  Microbiology results: -  Thank you for allowing pharmacy to be a part of this patient's care.  [name] 04/23/2018 9:41 PM

## 2018-04-23 NOTE — H&P (Signed)
History and Physical    RONI FRIBERG HUT:[id number] DOB: [birth year] DOA: 04/23/2018  PCP: Nicoletta Dress, MD  Patient coming from: Assisted living facility.  Chief Complaint: Rectal bleeding.  HPI: Latasha Guzman is a 73 y.o. female with history of atrial fibrillation on Xarelto, diastolic CHF, diabetes mellitus who was admitted last month for GI bleed and also in September 2019 for GI bleed at that time patient had undergone EGD and colonoscopy which showed hemorrhoids and diverticulosis subsequent which patient also had capsule endoscopy which was unremarkable was brought to the ER after patient had 3 episodes of frank rectal bleed at her living facility.  Patient denies any abdominal pain nausea or vomiting.  During recent admission patient also had paracentesis and on sonogram of the abdomen showed features concerning for cirrhosis of the liver.  ED Course: In the ER patient was mildly hypotensive was given fluids following which blood pressure improved.  Hemoglobin is dropped about 2 g from recent 9 g.  And since patient is on Xarelto was given Wandalee Ferdinand for reversal.  On-call legal gastroenterologist was consulted by ER physician and patient ordered 1 unit of PRBC transfusion.  Admitted for further management.  Review of Systems: As per HPI, rest all negative.   Past Medical History:  Diagnosis Date  . Acute blood loss anemia 08/02/2017  . Atrial fibrillation ([place])   . Chronic atrial fibrillation 10/15/2015  . Chronic diastolic CHF (congestive heart failure) ([place])   . Diabetes mellitus ([place]) 07/27/2017  . Edema   . Essential hypertension 10/15/2015  . GERD (gastroesophageal reflux disease) 07/27/2017  . GI bleed 07/24/2017  . Hyperlipidemia   . Pacemaker 10/15/2015  . Pneumonia of right lower lobe due to infectious organism ([place]) 08/02/2017  . Skin rash 08/02/2017  . Type 2 diabetes mellitus without complication ([place]) 6/56/8127    Past Surgical History:  Procedure Laterality Date   . ABDOMINAL HYSTERECTOMY  1995   Total  . AV FISTULA REPAIR    . CATARACT EXTRACTION    . GIVENS CAPSULE STUDY N/A 12/30/2017   Procedure: GIVENS CAPSULE STUDY;  Surgeon: Ronnette Juniper, MD;  Location: [facility];  Service: Gastroenterology;  Laterality: N/A;  . PACEMAKER INSERTION  10/30/2015   By Dr. Agustin Cree     reports that she has never smoked. She has never used smokeless tobacco. She reports that she does not drink alcohol or use drugs.  Allergies  Allergen Reactions  . Iodine Hives, Other (See Comments) and Shortness Of Breath    Throat swells  . Iodinated Diagnostic Agents   . Penicillins Hives    Has patient had a PCN reaction causing immediate rash, facial/tongue/throat swelling, SOB or lightheadedness with hypotension: Yes Has patient had a PCN reaction causing severe rash involving mucus membranes or skin necrosis: No Has patient had a PCN reaction that required hospitalization: No Has patient had a PCN reaction occurring within the last 10 years: No If all of the above answers are "NO", then may proceed with Cephalosporin use.     Family History  Problem Relation Age of Onset  . Stroke Mother   . Heart disease Mother   . Diabetes Mother   . Hypertension Mother   . Hyperlipidemia Mother   . Heart attack Father   . Heart disease Father   . Diabetes Maternal Grandfather   . Hypertension Maternal Grandfather   . Heart attack Paternal Grandfather   . Heart disease Paternal Grandfather     Prior  to Admission medications   Medication Sig Start Date End Date Taking? Authorizing Provider  atorvastatin (LIPITOR) 80 MG tablet Take 80 mg by mouth at bedtime.  03/23/17  Yes [provider]  cholecalciferol (VITAMIN D3) 25 MCG (1000 UT) tablet Take 1,000 Units by mouth daily.   Yes [provider]  diltiazem (CARDIZEM) 60 MG tablet Take 1 tablet (60 mg total) by mouth every 6 (six) hours. 03/27/18  Yes Georgette Shell, MD  docusate sodium (COLACE)  100 MG capsule Take 100 mg by mouth 2 (two) times daily.   Yes [provider]  ENSURE PLUS (ENSURE PLUS) LIQD Take 237 mLs by mouth 2 (two) times daily between meals.   Yes [provider]  fenofibrate 160 MG tablet Take 160 mg by mouth every morning.  03/23/17  Yes [provider]  furosemide (LASIX) 40 MG tablet Take 40 mg by mouth daily.   Yes [provider]  ipratropium-albuterol (DUONEB) 0.5-2.5 (3) MG/3ML SOLN Take 3 mLs by nebulization every 6 (six) hours as needed (sob and wheezing).   Yes [provider]  metoprolol tartrate (LOPRESSOR) 25 MG tablet Take 0.5 tablets (12.5 mg total) by mouth 2 (two) times daily. 03/27/18  Yes Georgette Shell, MD  Multiple Vitamin (MULTIVITAMIN WITH MINERALS) TABS tablet Take 1 tablet by mouth daily. 03/28/18  Yes Georgette Shell, MD  Nutritional Supplements (NUTRITIONAL SUPPLEMENT PLUS) LIQD Take 30 mLs by mouth daily. Med pass   Yes [provider]  omeprazole (PRILOSEC) 20 MG capsule Take 20 mg by mouth every evening.  03/23/17  Yes [provider]  polyethylene glycol (MIRALAX / GLYCOLAX) packet Take 17 g by mouth daily.   Yes [provider]  rivaroxaban (XARELTO) 20 MG TABS tablet Take 20 mg by mouth daily.   Yes [provider]  senna (SENOKOT) 8.6 MG TABS tablet Take 2 tablets by mouth at bedtime.   Yes [provider]  simethicone (MYLICON) 80 MG chewable tablet Chew 80 mg by mouth every 6 (six) hours as needed for flatulence.   Yes [provider]  Skin Protectants, Misc. (EUCERIN) cream Apply 1 application topically daily.   Yes [provider]  sodium phosphate Pediatric (FLEET) 3.5-9.5 GM/59ML enema Place 1 enema rectally once as needed for moderate constipation or severe constipation.   Yes [provider]  traMADol (ULTRAM) 50 MG tablet Take 1 tablet (50 mg total) by mouth every 12 (twelve) hours as needed for moderate  pain. Patient taking differently: Take 50 mg by mouth every 8 (eight) hours as needed for moderate pain.  03/27/18  Yes Georgette Shell, MD  traZODone (DESYREL) 50 MG tablet Take 0.5 tablets (25 mg total) by mouth at bedtime as needed for sleep. Patient taking differently: Take 25 mg by mouth at bedtime.  03/27/18  Yes Georgette Shell, MD  collagenase (SANTYL) ointment Apply topically daily. Patient not taking: Reported on 04/23/2018 03/27/18   Georgette Shell, MD  hydrocortisone (ANUSOL-HC) 2.5 % rectal cream Place rectally 2 (two) times daily. Patient not taking: Reported on 04/23/2018 03/27/18   Georgette Shell, MD  ipratropium-albuterol (DUONEB) 0.5-2.5 (3) MG/3ML SOLN Take 3 mLs by nebulization every 6 (six) hours. Pt also takes as needed 02/28/18 03/06/18  [provider]  lip balm (CARMEX) ointment Apply topically as needed for lip care. Patient not taking: Reported on 04/23/2018 03/27/18   Georgette Shell, MD  [facility] HFA 108 [phone] Base) MCG/ACT inhaler Inhale  2 puffs into the lungs every 6 (six) hours as needed for wheezing or shortness of breath.  04/24/17   [provider]    Physical Exam: Vitals:   04/23/18 1735 04/23/18 1815 04/23/18 1830 04/23/18 1930  BP:  (!) 115/55 (!) 107/59 (!) 101/35  Pulse: (!) 125  (!) 107 (!) 110  Resp: 13 13 16 18   Temp:      TempSrc:      SpO2: 100%  99% 99%  Weight:          Constitutional: Moderately built and nourished. Vitals:   04/23/18 1735 04/23/18 1815 04/23/18 1830 04/23/18 1930  BP:  (!) 115/55 (!) 107/59 (!) 101/35  Pulse: (!) 125  (!) 107 (!) 110  Resp: 13 13 16 18   Temp:      TempSrc:      SpO2: 100%  99% 99%  Weight:       Eyes: Anicteric no pallor. ENMT: No discharge from the ears eyes nose and mouth. Neck: No mass felt.  No neck rigidity. Respiratory: No rhonchi or crepitations. Cardiovascular: S1-S2 heard. Abdomen: Soft nontender bowel sounds present. Musculoskeletal: No edema. Skin: No  rash. Neurologic: Alert awake oriented to time place and person.  Moves all extremities. Psychiatric: Appears normal.  Normal affect.   Labs on Admission: I have personally reviewed following labs and imaging studies  CBC: Recent Labs  Lab 04/23/18 1703  WBC 12.3*  NEUTROABS 9.2*  HGB 7.7*  HCT 25.6*  MCV 95.2  PLT 865*   Basic Metabolic Panel: Recent Labs  Lab 04/23/18 1703  NA 134*  K 4.1  CL 101  CO2 25  GLUCOSE 94  BUN 37*  CREATININE 1.31*  CALCIUM 8.5*   GFR: Estimated Creatinine Clearance: 48.4 mL/min (A) (by C-G formula based on SCr of 1.31 mg/dL (H)). Liver Function Tests: Recent Labs  Lab 04/23/18 1703  AST 69*  ALT 15  ALKPHOS 93  BILITOT 2.4*  PROT 5.4*  ALBUMIN 1.9*   Recent Labs  Lab 04/23/18 1703  LIPASE 39   No results for input(s): AMMONIA in the last 168 hours. Coagulation Profile: Recent Labs  Lab 04/23/18 1703  INR 2.1*   Cardiac Enzymes: No results for input(s): CKTOTAL, CKMB, CKMBINDEX, TROPONINI in the last 168 hours. BNP (last 3 results) No results for input(s): PROBNP in the last 8760 hours. HbA1C: No results for input(s): HGBA1C in the last 72 hours. CBG: No results for input(s): GLUCAP in the last 168 hours. Lipid Profile: No results for input(s): CHOL, HDL, LDLCALC, TRIG, CHOLHDL, LDLDIRECT in the last 72 hours. Thyroid Function Tests: No results for input(s): TSH, T4TOTAL, FREET4, T3FREE, THYROIDAB in the last 72 hours. Anemia Panel: No results for input(s): VITAMINB12, FOLATE, FERRITIN, TIBC, IRON, RETICCTPCT in the last 72 hours. Urine analysis: No results found for: COLORURINE, APPEARANCEUR, LABSPEC, PHURINE, GLUCOSEU, HGBUR, BILIRUBINUR, KETONESUR, PROTEINUR, UROBILINOGEN, NITRITE, LEUKOCYTESUR Sepsis Labs: @LABRCNTIP (procalcitonin:4,lacticidven:4) )No results found for this or any previous visit (from the past 240 hour(s)).   Radiological Exams on Admission: Ct Abdomen Pelvis Wo Contrast  Result Date:  04/23/2018 CLINICAL DATA:  Intermittent GI bleeding EXAM: CT ABDOMEN AND PELVIS WITHOUT CONTRAST TECHNIQUE: Multidetector CT imaging of the abdomen and pelvis was performed following the standard protocol without IV contrast. COMPARISON:  None. FINDINGS: Lower chest: Moderate right and small left pleural effusions. Associated bibasilar atelectasis. Cardiomegaly. Small pericardial effusion. Hypodense blood pool relative to myocardium, suggesting anemia. Pacemaker leads, incompletely visualized. Hepatobiliary: Unenhanced liver is notable for  a mildly nodular hepatic contour (series 2/image 24), suggesting cirrhosis. Gallbladder is distended but grossly unremarkable. No intrahepatic or extrahepatic ductal dilatation. Pancreas: Within normal limits. Spleen: Borderline enlargement of the spleen, measuring 14.1 cm in craniocaudal dimension (coronal image 75). Adrenals/Urinary Tract: Adrenal glands are within normal limits. Kidneys are grossly unremarkable.  No hydronephrosis. Bladder is within normal limits. Stomach/Bowel: Stomach is within normal limits. No evidence of bowel obstruction. Normal appendix (series 2/image 54). Mild rectal wall thickening (series 2/image 78). Vascular/Lymphatic: No evidence of abdominal aortic aneurysm. Atherosclerotic calcifications of the abdominal aorta and branch vessels. No suspicious abdominopelvic lymphadenopathy. Reproductive: Status post hysterectomy. No adnexal masses. Other: Moderate abdominopelvic ascites. Musculoskeletal: Moderate to severe compression fracture deformity at T12, with possible pathologic fracture given the lytic/sclerotic appearance. No retropulsion. IMPRESSION: Mild rectal wall thickening, nonspecific. Correlate for infectious/inflammatory prostatitis. Neoplasm is considered unlikely, but endoscopic correlation is suggested. Cirrhosis with moderate abdominopelvic ascites and borderline splenomegaly. Moderate to severe compression fracture deformity at T12,  possibly pathologic. No retropulsion. Moderate right and small left pleural effusions. Electronically Signed   By: Julian Hy M.D.   On: 04/23/2018 17:19     Assessment/Plan Principal Problem:   Acute GI bleeding Active Problems:   Essential hypertension   Chronic diastolic CHF (congestive heart failure) (H[facility])   Symptomatic anemia    1. Acute GI bleeding -likely could be lower GI bleed and colonoscopy per the report showed an 2019 September diverticulosis and hemorrhoids.  Xarelto on hold and was given Kcentra.  1 unit of PRBC transfusion ordered.  Recheck CBC after transfusion.  Gastroenterologist has been consulted.  Since recent sonogram of the abdomen showed possibility of cirrhosis Protonix has been placed. 2. Acute blood loss anemia follow CBC after transfusion. 3. History of A. fib presently Xarelto on hold due to GI bleed and Kcentra was given.  On Cardizem and metoprolol which will be continued if blood pressure allows. 4. Chronic diastolic CHF holding Lasix due to acute GI bleed and low normal blood pressure.  Appears compensated at this time.  Note that patient has had ascites during last admission probably from cirrhosis. 5. Diabetes mellitus type 2 per chart patient not on any medication follow CBGs closely.   DVT prophylaxis: SCDs. Code Status: Full code. Family Communication: No family at the bedside. Disposition Plan: Back to facility when stable. Consults called: Copywriter, advertising. Admission status: Inpatient.   Rise Patience MD [facility] Pager [phone].  If 7PM-7AM, please contact night-coverage [email or web address] Password [facility]  04/23/2018, 9:36 PM

## 2018-04-23 NOTE — ED Notes (Signed)
Bed: JW09 Expected date:  Expected time:  Means of arrival:  Comments: Hemorrhoids

## 2018-04-23 NOTE — Progress Notes (Cosign Needed)
[facility] Progress Note Patient Name: Latasha Guzman DOB: 14-Feb-1946 MRN: [id number]   Date of Service  04/23/2018  HPI/Events of Note  NP /ED note, labs, meds reviewed. Camera: assessment. HR 118. Resting. MAP 77. sats fine. No sob.   73 yr old from NH. For 1) recurring rectal bleed, symptomatic anemia. Hg 7.7. extensive GI w/u in 10/2017. Neg. GI is aware. If worsens to consider re scope/CTA abd/pelvis.   eICU Interventions  Continue Care Eliquis on hold Watch for active bleeding. Transfuse if < 7. Asp precautions HR stable. On diltiazem as rate control.  On Ciproflox.      Intervention Category Major Interventions: Arrhythmia - evaluation and management;Hemorrhage - evaluation and management Intermediate Interventions: Coagulopathy - evaluation and management  Elmer Sow 04/23/2018, 9:57 PM

## 2018-04-23 NOTE — ED Notes (Signed)
ED TO INPATIENT HANDOFF REPORT  Name/Age/Gender Latasha Guzman 73 y.o. female  Code Status Code Status History    Date Active Date Inactive Code Status Order ID Comments User Context   03/04/2018 0843 03/27/2018 2114 Full Code [id number]  Marcell Anger, MD Inpatient   12/27/2017 2133 01/01/2018 1431 Full Code [id number]  Lenore Cordia, MD Inpatient      Home/SNF/Other Nursing Home  Chief Complaint Rectal Bleeding  Level of Care/Admitting Diagnosis ED Disposition    ED Disposition Condition [facility] Area: [facility] [[id number]]  Level of Care: Stepdown [14]  Admit to SDU based on following criteria: Severe physiological/psychological symptoms:  Any diagnosis requiring assessment & intervention at least every 4 hours on an ongoing basis to obtain desired patient outcomes including stability and rehabilitation  Diagnosis: Acute GI bleeding [[id number]]  Admitting Physician: Rise Patience [phone]  Attending Physician: Rise Patience [phone]  Estimated length of stay: past midnight tomorrow  Certification:: I certify this patient will need inpatient services for at least 2 midnights  PT Class (Do Not Modify): Inpatient [101]  PT Acc Code (Do Not Modify): Private [1]       Medical History Past Medical History:  Diagnosis Date  . Acute blood loss anemia 08/02/2017  . Atrial fibrillation ([place])   . Chronic atrial fibrillation 10/15/2015  . Chronic diastolic CHF (congestive heart failure) ([place])   . Diabetes mellitus ([place]) 07/27/2017  . Edema   . Essential hypertension 10/15/2015  . GERD (gastroesophageal reflux disease) 07/27/2017  . GI bleed 07/24/2017  . Hyperlipidemia   . Pacemaker 10/15/2015  . Pneumonia of right lower lobe due to infectious organism ([place]) 08/02/2017  . Skin rash 08/02/2017  . Type 2 diabetes mellitus without complication ([place]) 09/16/3662    Allergies Allergies  Allergen Reactions  . Iodine Hives, Other (See  Comments) and Shortness Of Breath    Throat swells  . Iodinated Diagnostic Agents   . Penicillins Hives    Has patient had a PCN reaction causing immediate rash, facial/tongue/throat swelling, SOB or lightheadedness with hypotension: Yes Has patient had a PCN reaction causing severe rash involving mucus membranes or skin necrosis: No Has patient had a PCN reaction that required hospitalization: No Has patient had a PCN reaction occurring within the last 10 years: No If all of the above answers are "NO", then may proceed with Cephalosporin use.     IV Location/Drains/Wounds Patient Lines/Drains/Airways Status   Active Line/Drains/Airways    Name:   Placement date:   Placement time:   Site:   Days:   Peripheral IV 04/23/18 Left Antecubital   04/23/18    1721    Antecubital   less than 1   Peripheral IV 04/23/18 Right Arm   04/23/18    1722    Arm   less than 1   Pressure Injury 03/04/18 Deep Tissue Injury - Purple or maroon localized area of discolored intact skin or blood-filled blister due to damage of underlying soft tissue from pressure and/or shear.   03/04/18    0500     50   Pressure Injury 03/06/18 Stage III -  Full thickness tissue loss. Subcutaneous fat may be visible but bone, tendon or muscle are NOT exposed.   03/06/18    0721     48   Pressure Injury 03/24/18 Unstageable - Full thickness tissue loss in which the base of the ulcer is covered by slough (yellow, tan,  gray, green or brown) and/or eschar (tan, brown or black) in the wound bed. HYDROTHERAPY   03/24/18    1430     30   Wound / Incision (Open or Dehisced) 03/18/18 Other (Comment) Heel Right   03/18/18    1100    Heel   36   Wound / Incision (Open or Dehisced) 03/18/18 Other (Comment) Heel Left   03/18/18    1100    Heel   36          Labs/Imaging Results for orders placed or performed during the hospital encounter of 04/23/18 (from the past 73 hour(s))  POC occult blood, ED Provider will collect     Status: Abnormal    Collection Time: 04/23/18  4:08 PM  Result Value Ref Range   Fecal Occult Bld POSITIVE (A) NEGATIVE  CBC with Differential     Status: Abnormal   Collection Time: 04/23/18  5:03 PM  Result Value Ref Range   WBC 12.3 (H) 4.0 - 10.5 K/uL   RBC 2.69 (L) 3.87 - 5.11 MIL/uL   Hemoglobin 7.7 (L) 12.0 - 15.0 g/dL   HCT 25.6 (L) 36.0 - 46.0 %   MCV 95.2 80.0 - 100.0 fL   MCH 28.6 26.0 - 34.0 pg   MCHC 30.1 30.0 - 36.0 g/dL   RDW 17.8 (H) 11.5 - 15.5 %   Platelets 495 (H) 150 - 400 K/uL   nRBC 0.0 0.0 - 0.2 %   Neutrophils Relative % 75 %   Neutro Abs 9.2 (H) 1.7 - 7.7 K/uL   Lymphocytes Relative 14 %   Lymphs Abs 1.7 0.7 - 4.0 K/uL   Monocytes Relative 9 %   Monocytes Absolute 1.1 (H) 0.1 - 1.0 K/uL   Eosinophils Relative 1 %   Eosinophils Absolute 0.2 0.0 - 0.5 K/uL   Basophils Relative 0 %   Basophils Absolute 0.0 0.0 - 0.1 K/uL   Immature Granulocytes 1 %   Abs Immature Granulocytes 0.06 0.00 - 0.07 K/uL    Comment: Performed at [facility], [place] [address] [place], [place] [address]  Comprehensive metabolic panel     Status: Abnormal   Collection Time: 04/23/18  5:03 PM  Result Value Ref Range   Sodium 134 (L) 135 - 145 mmol/L   Potassium 4.1 3.5 - 5.1 mmol/L   Chloride 101 98 - 111 mmol/L   CO2 25 22 - 32 mmol/L   Glucose, Bld 94 70 - 99 mg/dL   BUN 37 (H) 8 - 23 mg/dL   Creatinine, Ser 1.31 (H) 0.44 - 1.00 mg/dL   Calcium 8.5 (L) 8.9 - 10.3 mg/dL   Total Protein 5.4 (L) 6.5 - 8.1 g/dL   Albumin 1.9 (L) 3.5 - 5.0 g/dL   AST 69 (H) 15 - 41 U/L   ALT 15 0 - 44 U/L   Alkaline Phosphatase 93 38 - 126 U/L   Total Bilirubin 2.4 (H) 0.3 - 1.2 mg/dL   GFR calc non Af Amer 41 (L) >60 mL/min   GFR calc Af Amer 47 (L) >60 mL/min   Anion gap 8 5 - 15    Comment: Performed at [facility], [place] [address], [place], [place] [address]  Lipase, blood     Status: None   Collection Time: 04/23/18  5:03 PM  Result Value Ref Range   Lipase 39 11  - 51 U/L    Comment: Performed at [facility], [address] [place], [place] [address]  Type and screen     Status: None (Preliminary result)   Collection Time: 04/23/18  5:03 PM  Result Value Ref Range   ABO/RH(D) O POS    Antibody Screen NEG    Sample Expiration      04/26/2018 Performed at [facility], [place] [address] [place], [place] [address]    Unit Number [id number]    Blood Component Type RED CELLS,LR    Unit division 00    Status of Unit ALLOCATED    Donor AG Type      NEGATIVE FOR KIDD A ANTIGEN NEGATIVE FOR KELL ANTIGEN   Transfusion Status OK TO TRANSFUSE    Crossmatch Result COMPATIBLE   Protime-INR     Status: Abnormal   Collection Time: 04/23/18  5:03 PM  Result Value Ref Range   Prothrombin Time 23.0 (H) 11.4 - 15.2 seconds   INR 2.1 (H) 0.8 - 1.2    Comment: (NOTE) INR goal varies based on device and disease states. Performed at [facility], [place] [address] [place], [place] [address]   Heparin level - if patient on rivaroxaban Alveda Reasons) or apixaban Arne Cleveland)     Status: Abnormal   Collection Time: 04/23/18  5:04 PM  Result Value Ref Range   Heparin Unfractionated >2.20 (H) 0.30 - 0.70 IU/mL    Comment: RESULTS CONFIRMED BY MANUAL DILUTION Performed at [place] [address] [place], [place] [address]   Prepare RBC     Status: None   Collection Time: 04/23/18  7:12 PM  Result Value Ref Range   Order Confirmation      ORDER PROCESSED BY BLOOD BANK Performed at [place] [address] [place], [place] [address]    Ct Abdomen Pelvis Wo Contrast  Result Date: 04/23/2018 CLINICAL DATA:  Intermittent GI bleeding EXAM: CT ABDOMEN AND PELVIS WITHOUT CONTRAST TECHNIQUE: Multidetector CT imaging of the abdomen and pelvis was performed following the standard protocol without IV contrast. COMPARISON:  None. FINDINGS: Lower chest: Moderate right and  small left pleural effusions. Associated bibasilar atelectasis. Cardiomegaly. Small pericardial effusion. Hypodense blood pool relative to myocardium, suggesting anemia. Pacemaker leads, incompletely visualized. Hepatobiliary: Unenhanced liver is notable for a mildly nodular hepatic contour (series 2/image 24), suggesting cirrhosis. Gallbladder is distended but grossly unremarkable. No intrahepatic or extrahepatic ductal dilatation. Pancreas: Within normal limits. Spleen: Borderline enlargement of the spleen, measuring 14.1 cm in craniocaudal dimension (coronal image 75). Adrenals/Urinary Tract: Adrenal glands are within normal limits. Kidneys are grossly unremarkable.  No hydronephrosis. Bladder is within normal limits. Stomach/Bowel: Stomach is within normal limits. No evidence of bowel obstruction. Normal appendix (series 2/image 54). Mild rectal wall thickening (series 2/image 78). Vascular/Lymphatic: No evidence of abdominal aortic aneurysm. Atherosclerotic calcifications of the abdominal aorta and branch vessels. No suspicious abdominopelvic lymphadenopathy. Reproductive: Status post hysterectomy. No adnexal masses. Other: Moderate abdominopelvic ascites. Musculoskeletal: Moderate to severe compression fracture deformity at T12, with possible pathologic fracture given the lytic/sclerotic appearance. No retropulsion. IMPRESSION: Mild rectal wall thickening, nonspecific. Correlate for infectious/inflammatory prostatitis. Neoplasm is considered unlikely, but endoscopic correlation is suggested. Cirrhosis with moderate abdominopelvic ascites and borderline splenomegaly. Moderate to severe compression fracture deformity at T12, possibly pathologic. No retropulsion. Moderate right and small left pleural effusions. Electronically Signed   By: Julian Hy M.D.   On: 04/23/2018 17:19    Pending Labs [employer] (From admission, onward)    Start  Ordered   04/23/18 1543  Urinalysis, Routine w reflex  microscopic  (ED Abdominal Pain)  ONCE - STAT,   STAT     04/23/18 1542          Vitals/Pain Today's Vitals   04/23/18 1735 04/23/18 1815 04/23/18 1830 04/23/18 1930  BP:  (!) 115/55 (!) 107/59 (!) 101/35  Pulse: (!) 125  (!) 107 (!) 110  Resp: 13 13 16 18   Temp:      TempSrc:      SpO2: 100%  99% 99%  Weight:      PainSc: 0-No pain       Isolation Precautions No active isolations  Medications Medications  0.9 %  sodium chloride infusion (has no administration in time range)  sodium chloride 0.9 % bolus 1,000 mL (0 mLs Intravenous Stopped 04/23/18 1824)  prothrombin complex conc human (KCENTRA) IVPB 4,516 Units (0 Units Intravenous Stopped 04/23/18 1824)    Mobility walks with device

## 2018-04-23 NOTE — ED Triage Notes (Signed)
Staff at [facility] have observed pt. To have intermittent, small amts. Of hematochezia. She tells [place] she has had some recent issues with diarrhea and straining at stool. She has no c/o pain at this time. She is alert and oriented x 4.

## 2018-04-24 ENCOUNTER — Inpatient Hospital Stay (HOSPITAL_COMMUNITY): Payer: 59

## 2018-04-24 DIAGNOSIS — K625 Hemorrhage of anus and rectum: Secondary | ICD-10-CM

## 2018-04-24 DIAGNOSIS — K642 Third degree hemorrhoids: Secondary | ICD-10-CM

## 2018-04-24 DIAGNOSIS — R188 Other ascites: Secondary | ICD-10-CM

## 2018-04-24 DIAGNOSIS — K7460 Unspecified cirrhosis of liver: Secondary | ICD-10-CM

## 2018-04-24 LAB — BASIC METABOLIC PANEL
Anion gap: 6 (ref 5–15)
BUN: 39 mg/dL — AB (ref 8–23)
CO2: 26 mmol/L (ref 22–32)
CREATININE: 1.26 mg/dL — AB (ref 0.44–1.00)
Calcium: 7.9 mg/dL — ABNORMAL LOW (ref 8.9–10.3)
Chloride: 103 mmol/L (ref 98–111)
GFR calc Af Amer: 50 mL/min — ABNORMAL LOW (ref >60)
GFR calc non Af Amer: 43 mL/min — ABNORMAL LOW (ref >60)
Glucose, Bld: 76 mg/dL (ref 70–99)
Potassium: 3.2 mmol/L — ABNORMAL LOW (ref 3.5–5.1)
Sodium: 135 mmol/L (ref 135–145)

## 2018-04-24 LAB — CBC
HCT: 25 % — ABNORMAL LOW (ref 36.0–46.0)
Hemoglobin: 7.7 g/dL — ABNORMAL LOW (ref 12.0–15.0)
MCH: 29.2 pg (ref 26.0–34.0)
MCHC: 30.8 g/dL (ref 30.0–36.0)
MCV: 94.7 fL (ref 80.0–100.0)
Platelets: 243 10*3/uL (ref 150–400)
RBC: 2.64 MIL/uL — ABNORMAL LOW (ref 3.87–5.11)
RDW: 16.7 % — ABNORMAL HIGH (ref 11.5–15.5)
WBC: 8.9 10*3/uL (ref 4.0–10.5)
nRBC: 0 % (ref 0.0–0.2)

## 2018-04-24 LAB — GLUCOSE, CAPILLARY
GLUCOSE-CAPILLARY: 80 mg/dL (ref 70–99)
Glucose-Capillary: 70 mg/dL (ref 70–99)

## 2018-04-24 LAB — HEPATIC FUNCTION PANEL
ALBUMIN: 1.8 g/dL — AB (ref 3.5–5.0)
ALT: 15 U/L (ref 0–44)
AST: 64 U/L — AB (ref 15–41)
Alkaline Phosphatase: 75 U/L (ref 38–126)
Bilirubin, Direct: 1.4 mg/dL — ABNORMAL HIGH (ref 0.0–0.2)
Indirect Bilirubin: 0.9 mg/dL (ref 0.3–0.9)
Total Bilirubin: 2.3 mg/dL — ABNORMAL HIGH (ref 0.3–1.2)
Total Protein: 4.7 g/dL — ABNORMAL LOW (ref 6.5–8.1)

## 2018-04-24 LAB — MRSA PCR SCREENING: MRSA by PCR: NEGATIVE

## 2018-04-24 LAB — PREPARE RBC (CROSSMATCH)

## 2018-04-24 MED ORDER — ORAL CARE MOUTH RINSE
15.0000 mL | Freq: Two times a day (BID) | OROMUCOSAL | Status: DC
  Administered 2018-04-24 – 2018-04-27 (×5): 15 mL via OROMUCOSAL

## 2018-04-24 MED ORDER — TRAMADOL HCL 50 MG PO TABS
50.0000 mg | ORAL_TABLET | Freq: Four times a day (QID) | ORAL | Status: DC | PRN
  Administered 2018-04-24 – 2018-04-28 (×10): 50 mg via ORAL
  Filled 2018-04-24 (×11): qty 1

## 2018-04-24 MED ORDER — SODIUM CHLORIDE 0.9% IV SOLUTION
Freq: Once | INTRAVENOUS | Status: AC
  Administered 2018-04-24: 17:00:00 via INTRAVENOUS

## 2018-04-24 MED ORDER — PANTOPRAZOLE SODIUM 40 MG PO TBEC
40.0000 mg | DELAYED_RELEASE_TABLET | Freq: Every day | ORAL | Status: DC
  Administered 2018-04-24 – 2018-04-28 (×5): 40 mg via ORAL
  Filled 2018-04-24 (×5): qty 1

## 2018-04-24 MED ORDER — SODIUM CHLORIDE 0.9 % IV SOLN
INTRAVENOUS | Status: DC
  Administered 2018-04-26: 17:00:00 via INTRAVENOUS

## 2018-04-24 MED ORDER — ALBUMIN HUMAN 5 % IV SOLN: 12.5000 g | Freq: Once | INTRAVENOUS | Status: DC

## 2018-04-24 MED ORDER — CHLORHEXIDINE GLUCONATE CLOTH 2 % EX PADS
6.0000 | MEDICATED_PAD | Freq: Every day | CUTANEOUS | Status: DC
  Administered 2018-04-24 – 2018-04-26 (×3): 6 via TOPICAL

## 2018-04-24 MED ORDER — POTASSIUM CHLORIDE 10 MEQ/100ML IV SOLN
10.0000 meq | INTRAVENOUS | Status: AC
  Administered 2018-04-24 (×4): 10 meq via INTRAVENOUS
  Filled 2018-04-24 (×4): qty 100

## 2018-04-24 MED ORDER — SODIUM CHLORIDE 0.9 % IV SOLN
500.0000 mL | Freq: Once | INTRAVENOUS | Status: AC
  Administered 2018-04-24: 250 mL via INTRAVENOUS

## 2018-04-24 MED ORDER — HYDROCORTISONE 2.5 % RE CREA
TOPICAL_CREAM | Freq: Two times a day (BID) | RECTAL | Status: DC
  Administered 2018-04-24 – 2018-04-28 (×7): via RECTAL
  Filled 2018-04-24: qty 28.35

## 2018-04-24 MED ORDER — ALBUMIN HUMAN 5 % IV SOLN
12.5000 g | Freq: Once | INTRAVENOUS | Status: AC
  Administered 2018-04-25: 12.5 g via INTRAVENOUS
  Filled 2018-04-24: qty 250

## 2018-04-24 NOTE — Progress Notes (Signed)
PROGRESS NOTE    Latasha Guzman  YNW:[id number] DOB: [birth year] DOA: 04/23/2018 PCP: Nicoletta Dress, MD  Brief Narrative: 73 y.o. female with history of atrial fibrillation on Xarelto, diastolic CHF, diabetes mellitus who was admitted last month for GI bleed and also in September 2019 for GI bleed at that time patient had undergone EGD and colonoscopy which showed hemorrhoids and diverticulosis subsequent which patient also had capsule endoscopy which was unremarkable was brought to the ER after patient had 3 episodes of frank rectal bleed at her living facility.  Patient denies any abdominal pain nausea or vomiting.  During recent admission patient also had paracentesis and on sonogram of the abdomen showed features concerning for cirrhosis of the liver.  ED Course: In the ER patient was mildly hypotensive was given fluids following which blood pressure improved.  Hemoglobin is dropped about 2 g from recent 9 g.  And since patient is on Xarelto was given Wandalee Ferdinand for reversal.  On-call legal gastroenterologist was consulted by ER physician and patient ordered 1 unit of PRBC transfusion.  Admitted for further management  Assessment & Plan:   Principal Problem:   Acute GI bleeding Active Problems:   Essential hypertension   Chronic diastolic CHF (congestive heart failure) (HCC)   Symptomatic anemia  #1 lower GI bleed most likely from hemorrhoids.  Patient reports she was constipated for many days and was requesting an enema since laxatives did not work at the nursing home, but she was not given an enema because the staff thought she had some loose stools.  But she felt her abdomen was full and had more stools to come out .  I do think the constipation made the hemorrhoids bleed.  Her hemoglobin today is 7.7 down from 9 early February 2020.  1 unit of RBC has been ordered.  GI consult has been placed.  Past GI work-up EGD colonoscopy and capsule endoscopy showed diverticulosis and hemorrhoids.   Patient has chronic anemia and has received blood transfusion on and off.  In addition to that she is on Xarelto for atrial fibrillation which is on hold at this time.  Patient is anxious to be going back to the nursing home as soon as possible.  #2 history of atrial fibrillation on Xarelto which is on hold.  Her rate has always been in the high 90s to 100s in now and in the past.  And she runs a soft blood pressure.  Restart Cardizem 60 mg every 6 hours.  She is also on Lopressor 12.5 mg twice a day.  #3 congestive heart failure diastolic-stable at this time Lasix on hold.  She takes 40 mg Lasix daily.  #4 cirrhosis with ascites monitor closely off Lasix.  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  Continue wound care.   Pressure Injury 03/04/18 Stage IV - Full thickness tissue loss with exposed bone, tendon or muscle. (Active)  03/04/18 0500  Location: Sacrum  Location Orientation: Mid  Staging: Stage IV - Full thickness tissue loss with exposed bone, tendon or muscle.  Wound Description (Comments):   Present on Admission: Yes     Pressure Injury 03/06/18 Stage II -  Partial thickness loss of dermis presenting as a shallow open ulcer with a red, pink wound bed without slough. (Active)  03/06/18 6578  Location:  Sacrum  Location Orientation: Mid;Left  Staging: Stage II -  Partial thickness loss of dermis presenting as a shallow open ulcer with a red, pink wound bed without slough.  Wound Description (Comments):   Present on Admission: Yes     Pressure Injury 03/24/18 Stage II -  Partial thickness loss of dermis presenting as a shallow open ulcer with a red, pink wound bed without slough. (Active)  03/24/18 1430  Location: Sacrum  Location Orientation: Mid;Right  Staging: Stage II -  Partial thickness loss of dermis  presenting as a shallow open ulcer with a red, pink wound bed without slough.  Wound Description (Comments):   Present on Admission: Yes     Pressure Injury 04/23/18 Stage III -  Full thickness tissue loss. Subcutaneous fat may be visible but bone, tendon or muscle are NOT exposed. (Active)  04/23/18 2100  Location: Hip  Location Orientation: Left  Staging: Stage III -  Full thickness tissue loss. Subcutaneous fat may be visible but bone, tendon or muscle are NOT exposed.  Wound Description (Comments):   Present on Admission: Yes        Estimated body mass index is 27.46 kg/m as calculated from the following:   Height as of this encounter: 5' 4.5" (1.638 m).   Weight as of this encounter: 73.7 kg.  DVT prophylaxis: SCD Code Status: Full code Family Communication: None Disposition Plan: Back to nursing home once stable  Consultants:   GI  Procedures: None Antimicrobials: None  Subjective: She has no complaints has had no further bowel movements reports she has hemorrhoids and had hemorrhoid bleed with constipation denies any chest pain shortness of breath nausea vomiting  Objective: Vitals:   04/24/18 0315 04/24/18 0400 04/24/18 0615 04/24/18 0700  BP:   107/63 93/63  Pulse:   (!) 113 (!) 102  Resp:   16 15  Temp: 98.3 F (36.8 C) (!) 97.2 F (36.2 C)    TempSrc: Tympanic Axillary    SpO2:   100% 98%  Weight:      Height:        Intake/Output Summary (Last 24 hours) at 04/24/2018 0740 Last data filed at 04/24/2018 0300 Gross per 24 hour  Intake 636.92 ml  Output -  Net 636.92 ml   Filed Weights   04/23/18 1644 04/23/18 1732 04/24/18 0220  Weight: 102.1 kg 102.1 kg 73.7 kg    Examination:  General exam: Appears calm and comfortable  Respiratory system: Clear to auscultation. Respiratory effort normal. Cardiovascular system: S1 & S2 heard, RRR. No JVD, murmurs, rubs, gallops or clicks. No pedal edema.  Tachycardic Gastrointestinal system: Abdomen is  nondistended, soft and nontender. No organomegaly or masses felt. Normal bowel sounds heard. Central nervous system: Alert and oriented. No focal neurological deficits. Extremities: Symmetric 5 x 5 power. Skin: No rashes, lesions or ulcers Psychiatry: Judgement and insight appear normal. Mood & affect appropriate.     Data Reviewed: I have personally reviewed following labs and imaging studies  CBC: Recent Labs  Lab 04/23/18 1703 04/24/18 0541  WBC 12.3* 8.9  NEUTROABS 9.2*  --   HGB 7.7* 7.7*  HCT 25.6* 25.0*  MCV 95.2 94.7  PLT 495* 161   Basic Metabolic Panel: Recent Labs  Lab 04/23/18 1703 04/24/18 0541  NA 134* 135  K 4.1 3.2*  CL 101 103  CO2 25 26  GLUCOSE 94 76  BUN 37* 39*  CREATININE 1.31* 1.26*  CALCIUM 8.5* 7.9*   GFR: Estimated Creatinine  Clearance: 40.7 mL/min (A) (by C-G formula based on SCr of 1.26 mg/dL (H)). Liver Function Tests: Recent Labs  Lab 04/23/18 1703 04/24/18 0541  AST 69* 64*  ALT 15 15  ALKPHOS 93 75  BILITOT 2.4* 2.3*  PROT 5.4* 4.7*  ALBUMIN 1.9* 1.8*   Recent Labs  Lab 04/23/18 1703  LIPASE 39   No results for input(s): AMMONIA in the last 168 hours. Coagulation Profile: Recent Labs  Lab 04/23/18 1703  INR 2.1*   Cardiac Enzymes: No results for input(s): CKTOTAL, CKMB, CKMBINDEX, TROPONINI in the last 168 hours. BNP (last 3 results) No results for input(s): PROBNP in the last 8760 hours. HbA1C: No results for input(s): HGBA1C in the last 72 hours. CBG: No results for input(s): GLUCAP in the last 168 hours. Lipid Profile: No results for input(s): CHOL, HDL, LDLCALC, TRIG, CHOLHDL, LDLDIRECT in the last 72 hours. Thyroid Function Tests: No results for input(s): TSH, T4TOTAL, FREET4, T3FREE, THYROIDAB in the last 72 hours. Anemia Panel: No results for input(s): VITAMINB12, FOLATE, FERRITIN, TIBC, IRON, RETICCTPCT in the last 72 hours. Sepsis Labs: No results for input(s): PROCALCITON, LATICACIDVEN in the last  168 hours.  Recent Results (from the past 240 hour(s))  MRSA PCR Screening     Status: None   Collection Time: 04/24/18  2:38 AM  Result Value Ref Range Status   MRSA by PCR NEGATIVE NEGATIVE Final    Comment:        The GeneXpert MRSA Assay (FDA approved for NASAL specimens only), is one component of a comprehensive MRSA colonization surveillance program. It is not intended to diagnose MRSA infection nor to guide or monitor treatment for MRSA infections. Performed at [facility], [place] [address] [place], [place] [address]          Radiology Studies: Ct Abdomen Pelvis Wo Contrast  Result Date: 04/23/2018 CLINICAL DATA:  Intermittent GI bleeding EXAM: CT ABDOMEN AND PELVIS WITHOUT CONTRAST TECHNIQUE: Multidetector CT imaging of the abdomen and pelvis was performed following the standard protocol without IV contrast. COMPARISON:  None. FINDINGS: Lower chest: Moderate right and small left pleural effusions. Associated bibasilar atelectasis. Cardiomegaly. Small pericardial effusion. Hypodense blood pool relative to myocardium, suggesting anemia. Pacemaker leads, incompletely visualized. Hepatobiliary: Unenhanced liver is notable for a mildly nodular hepatic contour (series 2/image 24), suggesting cirrhosis. Gallbladder is distended but grossly unremarkable. No intrahepatic or extrahepatic ductal dilatation. Pancreas: Within normal limits. Spleen: Borderline enlargement of the spleen, measuring 14.1 cm in craniocaudal dimension (coronal image 75). Adrenals/Urinary Tract: Adrenal glands are within normal limits. Kidneys are grossly unremarkable.  No hydronephrosis. Bladder is within normal limits. Stomach/Bowel: Stomach is within normal limits. No evidence of bowel obstruction. Normal appendix (series 2/image 54). Mild rectal wall thickening (series 2/image 78). Vascular/Lymphatic: No evidence of abdominal aortic aneurysm. Atherosclerotic calcifications of the abdominal aorta  and branch vessels. No suspicious abdominopelvic lymphadenopathy. Reproductive: Status post hysterectomy. No adnexal masses. Other: Moderate abdominopelvic ascites. Musculoskeletal: Moderate to severe compression fracture deformity at T12, with possible pathologic fracture given the lytic/sclerotic appearance. No retropulsion. IMPRESSION: Mild rectal wall thickening, nonspecific. Correlate for infectious/inflammatory prostatitis. Neoplasm is considered unlikely, but endoscopic correlation is suggested. Cirrhosis with moderate abdominopelvic ascites and borderline splenomegaly. Moderate to severe compression fracture deformity at T12, possibly pathologic. No retropulsion. Moderate right and small left pleural effusions. Electronically Signed   By: Julian Hy M.D.   On: 04/23/2018 17:19        Scheduled Meds: . Chlorhexidine Gluconate Cloth  6 each Topical Daily  . diltiazem  60 mg Oral Q6H  . mouth rinse  15 mL Mouth Rinse BID  . metoprolol tartrate  12.5 mg Oral BID  . [START ON 04/27/2018] pantoprazole  40 mg Intravenous Q12H  . traZODone  25 mg Oral QHS   Continuous Infusions: . sodium chloride    . sodium chloride    . ciprofloxacin Stopped (04/24/18 0051)  . pantoprozole (PROTONIX) infusion 8 mg/hr (04/24/18 0224)     LOS: 1 day    Georgette Shell, MD [facility] If 7PM-7AM, please contact night-coverage [email or web address] Password [email or web address] 04/24/2018, 7:40 AM

## 2018-04-24 NOTE — Consult Note (Signed)
[facility] Nurse wound consult note Reason for Consult:Patient known to our team from previous admission. last seen by this writer on 03/22/18.  Stage 4 pressure injury to sacrum, full thickness wound to left hip (etiology not known).  Patient with perineal moisture associated skin damage with likely fungal overgrowth. Wound type:Pressure, infectious Pressure Injury POA: Yes Measurement: Sacral Stage 4:  10cm x 9cm x 1.6cm with ruddy red, friable wound bed, and light yellow exudate in a moderate amount. Left hip full thickness wound: 2.8cm round x 1cm depth with yellow, necrotic wound bed and moderate amount light yellow exudate. Wound bed:As described above Drainage (amount, consistency, odor) As described above Periwound:intact, macerated in perineum with erythema  Dressing procedure/placement/frequency: Patient is on a mattress replacement with low air loss as she is in our ICU.  I have asked that she be placed upon a mattress replacement upon transfer to the floor. We will conservatively fill the defects with a saline moistened gauze dressing followed by a dry gauze topper and secure with paper tape. Patient is to be turned and repositioned from side to side with time in the supine position minimized.  I have provided prevalon pressure redistribution heel boots to both float the heels (which are intact) and to correct the lateral rotation of the right LE.  If you agree, please order a few doses of systemic antifungal, eg. Diflucan for the perineal yeast.  [facility] nursing team will not follow, but will remain available to this patient, the nursing and medical teams.  Please re-consult if needed. Thanks, Maudie Flakes, MSN, RN, [place], Arther Abbott  Pager# [phone]

## 2018-04-24 NOTE — Consult Note (Addendum)
Consultation  Referring Provider:Dr. Rodena Piety     Primary Care Physician:  Nicoletta Dress, MD Primary Gastroenterologist:   Althia Forts, Dr. Lyda Jester in [place]      Reason for Consultation: Rectal bleeding            HPI:   Latasha Guzman is a 73 y.o. female with a past medical history of A. fib on Xarelto (echo 12/2017 LVEF 50-55% with moderate to severe tricuspid regurgitation, pericardial effusion with fibrinous material, severe biatrial enlargement and elevated right-sided pressures), diastolic CHF, diabetes, CKD, IDA and anemia of chronic disease requiring multiple transfusions and EPO in the past, who was admitted through the ER on 04/23/2018 for rectal bleeding from her assisted living facility.    Patient recently admitted September 2019 for GI bleed and at that time underwent Givens capsule after recent EGD and colonoscopy at [facility].  08/10/2017 EGD and colonoscopy at [facility], per notes these were unremarkable.  10/2017 colonoscopy and EGD repeated by Dr. Lyda Jester.  Mild diverticular disease, external hemorrhoids, hiatal hernia noted otherwise normal.  01/02/2018 capsule endoscopy was normal, at that time it was recommended to evaluate for non-GI cause of anemia and patient was kept on iron supplementation.      03/04/2018 our service was consulted for rectal bleeding.  At that time no further endoscopic procedures were pursued given all the work-up above.  Her hemorrhoids were treated with hydrocortisone cream, she had no further bleeding and she was told to stay off of Eliquis for a week and follow-up with her outpatient GI team.    Today, the patient explains that she was brought to the after she had 3 episodes of frank rectal bleeding at her living facility on what sounds like 04/22/2018, per nursing staff in the room patient also had some bright red blood with bowel movement this morning.  Patient tells me that she was feeling constipated prior to  the onset of these bloody stools and was trying to get something more than MiraLAX from her living facility but they told her that she was "still having a little bit of stool" and did not need anything else.  Overall the patient tells me that her bottom is "very sore".  She is only comfortable in certain positions on the bed.  Per nursing staff she also has multiple decubitus ulcers.    Denies fever, chills, weight loss, anorexia, nausea, vomiting, abdominal pain or symptoms that awaken her from sleep.  ER course: CT abdomen pelvis without contrast showed mild rectal wall thickening, nonspecific, cirrhosis with moderate abdominopelvic ascites and borderline splenomegaly, moderate to severe compression fracture deformity at T12 and moderate right and small left pleural effusions; CBC with a hemoglobin of 7.7 (from 9.0 on 03/26/2018), hypotensive and given fluids, given Wandalee Ferdinand for reversal of Xarelto  Past Medical History:  Diagnosis Date  . Acute blood loss anemia 08/02/2017  . Atrial fibrillation ([place])   . Chronic atrial fibrillation 10/15/2015  . Chronic diastolic CHF (congestive heart failure) ([place])   . Diabetes mellitus ([place]) 07/27/2017  . Edema   . Essential hypertension 10/15/2015  . GERD (gastroesophageal reflux disease) 07/27/2017  . GI bleed 07/24/2017  . Hyperlipidemia   . Pacemaker 10/15/2015  . Pneumonia of right lower lobe due to infectious organism ([place]) 08/02/2017  . Skin rash 08/02/2017  . Type 2 diabetes mellitus without complication ([place]) 1/61/0960    Past Surgical History:  Procedure Laterality Date  . ABDOMINAL HYSTERECTOMY  1995   Total  . AV FISTULA REPAIR    . CATARACT EXTRACTION    . GIVENS CAPSULE STUDY N/A 12/30/2017   Procedure: GIVENS CAPSULE STUDY;  Surgeon: Ronnette Juniper, MD;  Location: [place];  Service: Gastroenterology;  Laterality: N/A;  . PACEMAKER INSERTION  10/30/2015   By Dr. Agustin Cree    Family History  Problem Relation Age of Onset  . Stroke Mother     . Heart disease Mother   . Diabetes Mother   . Hypertension Mother   . Hyperlipidemia Mother   . Heart attack Father   . Heart disease Father   . Diabetes Maternal Grandfather   . Hypertension Maternal Grandfather   . Heart attack Paternal Grandfather   . Heart disease Paternal Grandfather     Social History   Tobacco Use  . Smoking status: Never Smoker  . Smokeless tobacco: Never Used  Substance Use Topics  . Alcohol use: Never    Frequency: Never  . Drug use: Never    Prior to Admission medications   Medication Sig Start Date End Date Taking? Authorizing Provider  atorvastatin (LIPITOR) 80 MG tablet Take 80 mg by mouth at bedtime.  03/23/17  Yes [provider]  cholecalciferol (VITAMIN D3) 25 MCG (1000 UT) tablet Take 1,000 Units by mouth daily.   Yes [provider]  diltiazem (CARDIZEM) 60 MG tablet Take 1 tablet (60 mg total) by mouth every 6 (six) hours. 03/27/18  Yes Georgette Shell, MD  docusate sodium (COLACE) 100 MG capsule Take 100 mg by mouth 2 (two) times daily.   Yes [provider]  ENSURE PLUS (ENSURE PLUS) LIQD Take 237 mLs by mouth 2 (two) times daily between meals.   Yes [provider]  fenofibrate 160 MG tablet Take 160 mg by mouth every morning.  03/23/17  Yes [provider]  furosemide (LASIX) 40 MG tablet Take 40 mg by mouth daily.   Yes [provider]  ipratropium-albuterol (DUONEB) 0.5-2.5 (3) MG/3ML SOLN Take 3 mLs by nebulization every 6 (six) hours as needed (sob and wheezing).   Yes [provider]  metoprolol tartrate (LOPRESSOR) 25 MG tablet Take 0.5 tablets (12.5 mg total) by mouth 2 (two) times daily. 03/27/18  Yes Georgette Shell, MD  Multiple Vitamin (MULTIVITAMIN WITH MINERALS) TABS tablet Take 1 tablet by mouth daily. 03/28/18  Yes Georgette Shell, MD  Nutritional Supplements (NUTRITIONAL SUPPLEMENT PLUS) LIQD Take 30 mLs by mouth daily. Med pass   Yes [provider]  omeprazole (PRILOSEC) 20 MG capsule Take 20 mg by mouth every evening.  03/23/17  Yes [provider]  polyethylene glycol (MIRALAX / GLYCOLAX) packet Take 17 g by mouth daily.   Yes [provider]  rivaroxaban (XARELTO) 20 MG TABS tablet Take 20 mg by mouth daily.   Yes [provider]  senna (SENOKOT) 8.6 MG TABS tablet Take 2 tablets by mouth at bedtime.   Yes [provider]  simethicone (MYLICON) 80 MG chewable tablet Chew 80 mg by mouth every 6 (six) hours as needed for flatulence.   Yes [provider]  Skin Protectants, Misc. (EUCERIN) cream Apply 1 application topically daily.   Yes [provider]  sodium phosphate Pediatric (FLEET) 3.5-9.5 GM/59ML enema Place 1 enema rectally once as needed for moderate constipation or severe constipation.   Yes [provider]  traMADol (ULTRAM) 50 MG tablet Take 1 tablet (50 mg total) by mouth every 12 (twelve)  hours as needed for moderate pain. Patient taking differently: Take 50 mg by mouth every 8 (eight) hours as needed for moderate pain.  03/27/18  Yes Georgette Shell, MD  traZODone (DESYREL) 50 MG tablet Take 0.5 tablets (25 mg total) by mouth at bedtime as needed for sleep. Patient taking differently: Take 25 mg by mouth at bedtime.  03/27/18  Yes Georgette Shell, MD  collagenase (SANTYL) ointment Apply topically daily. Patient not taking: Reported on 04/23/2018 03/27/18   Georgette Shell, MD  hydrocortisone (ANUSOL-HC) 2.5 % rectal cream Place rectally 2 (two) times daily. Patient not taking: Reported on 04/23/2018 03/27/18   Georgette Shell, MD  ipratropium-albuterol (DUONEB) 0.5-2.5 (3) MG/3ML SOLN Take 3 mLs by nebulization every 6 (six) hours. Pt also takes as needed 02/28/18 03/06/18  [provider]  lip balm (CARMEX) ointment Apply topically as needed for lip care. Patient not taking: Reported on 04/23/2018 03/27/18   Georgette Shell, MD    [facility] HFA 108 [phone] Base) MCG/ACT inhaler Inhale 2 puffs into the lungs every 6 (six) hours as needed for wheezing or shortness of breath.  04/24/17   [provider]    Current Facility-Administered Medications  Medication Dose Route Frequency Provider Last Rate Last Dose  . 0.9 %  sodium chloride infusion (Manually program via Guardrails IV Fluids)   Intravenous Once Georgette Shell, MD      . 0.9 %  sodium chloride infusion  10 mL/hr Intravenous Once Rise Patience, MD      . 0.9 %  sodium chloride infusion   Intravenous Continuous Rise Patience, MD      . acetaminophen (TYLENOL) tablet 650 mg  650 mg Oral Q6H PRN Rise Patience, MD   650 mg at 04/23/18 2251   Or  . acetaminophen (TYLENOL) suppository 650 mg  650 mg Rectal Q6H PRN Rise Patience, MD      . Chlorhexidine Gluconate Cloth 2 % PADS 6 each  6 each Topical Daily Rise Patience, MD      . diltiazem (CARDIZEM) tablet 60 mg  60 mg Oral Q6H Rise Patience, MD   60 mg at 04/24/18 5732  . hydrocortisone (ANUSOL-HC) 2.5 % rectal cream   Rectal BID Georgette Shell, MD      . ipratropium-albuterol (DUONEB) 0.5-2.5 (3) MG/3ML nebulizer solution 3 mL  3 mL Nebulization Q6H PRN Rise Patience, MD      . MEDLINE mouth rinse  15 mL Mouth Rinse BID Rise Patience, MD      . metoprolol tartrate (LOPRESSOR) tablet 12.5 mg  12.5 mg Oral BID Rise Patience, MD   12.5 mg at 04/23/18 2251  . ondansetron (ZOFRAN) tablet 4 mg  4 mg Oral Q6H PRN Rise Patience, MD       Or  . ondansetron [facility]) injection 4 mg  4 mg Intravenous Q6H PRN Rise Patience, MD      . pantoprazole (PROTONIX) 80 mg in sodium chloride 0.9 % 250 mL (0.32 mg/mL) infusion  8 mg/hr Intravenous Continuous Rise Patience, MD 25 mL/hr at 04/24/18 0224 8 mg/hr at 04/24/18 0224  . [START ON 04/27/2018] pantoprazole (PROTONIX) injection 40 mg  40 mg Intravenous Q12H Rise Patience, MD      .  traZODone (DESYREL) tablet 25 mg  25 mg Oral QHS Rise Patience, MD   25 mg at 04/23/18 2252    Allergies as of  04/23/2018 - Review Complete 04/23/2018  Allergen Reaction Noted  . Iodine Hives, Other (See Comments), and Shortness Of Breath 10/15/2015  . Iodinated diagnostic agents  10/15/2015  . Penicillins Hives 10/15/2015     Review of Systems:    Constitutional: No weight loss, fever or chills Skin: No rash  Cardiovascular: No chest pain Respiratory: No SOB  Gastrointestinal: See HPI and otherwise negative Genitourinary: No dysuria  Neurological: No headache, dizziness or syncope Musculoskeletal: No new muscle or joint pain Hematologic: No bruising Psychiatric: No history of depression or anxiety    Physical Exam:  Vital signs in last 24 hours: Temp:  [97.2 F (36.2 C)-98.5 F (36.9 C)] 97.2 F (36.2 C) (03/02 0400) Pulse Rate:  [83-134] 102 (03/02 0700) Resp:  [8-24] 15 (03/02 0700) BP: (71-120)/(35-70) 93/63 (03/02 0700) SpO2:  [97 %-100 %] 98 % (03/02 0700) Weight:  [73.7 kg-102.1 kg] 73.7 kg (03/02 0220) Last BM Date: 04/23/18 General:   Pleasant Elderly Caucasian female appears to be in NAD, Well developed, Well nourished, alert and cooperative Head:  Normocephalic and atraumatic. Eyes:   PEERL, EOMI. No icterus. Conjunctiva pink. Ears:  Normal auditory acuity. Neck:  Supple Throat: Oral cavity and pharynx without inflammation, swelling or lesion.  Lungs: Respirations even and unlabored. Lungs clear to auscultation bilaterally.   No wheezes, crackles, or rhonchi.  Heart: Normal S1, S2. No MRG. Regular rate and rhythm. No peripheral edema, cyanosis or pallor.  Abdomen:  Soft, nondistended, nontender. No rebound or guarding. Normal bowel sounds. No appreciable masses or hepatomegaly. Rectal:  External: Two large external hemorrhoids, clean bandages x3 on buttocks, brb seen at rectal opening; Internal: fullness and some ttp, brb seen on glove after exam Msk:   Symmetrical without gross deformities. Peripheral pulses intact.  Extremities:  Without edema, no deformity or joint abnormality. Neurologic:  Alert and  oriented x4;  grossly normal neurologically.  Skin:   Dry and intact without significant lesions or rashes. Psychiatric: Demonstrates good judgement and reason without abnormal affect or behaviors.  LAB RESULTS: Recent Labs    04/23/18 1703 04/24/18 0541  WBC 12.3* 8.9  HGB 7.7* 7.7*  HCT 25.6* 25.0*  PLT 495* 243   BMET Recent Labs    04/23/18 1703 04/24/18 0541  NA 134* 135  K 4.1 3.2*  CL 101 103  CO2 25 26  GLUCOSE 94 76  BUN 37* 39*  CREATININE 1.31* 1.26*  CALCIUM 8.5* 7.9*   LFT Recent Labs    04/24/18 0541  PROT 4.7*  ALBUMIN 1.8*  AST 64*  ALT 15  ALKPHOS 75  BILITOT 2.3*  BILIDIR 1.4*  IBILI 0.9   PT/INR Recent Labs    04/23/18 1703  LABPROT 23.0*  INR 2.1*    STUDIES: Ct Abdomen Pelvis Wo Contrast  Result Date: 04/23/2018 CLINICAL DATA:  Intermittent GI bleeding EXAM: CT ABDOMEN AND PELVIS WITHOUT CONTRAST TECHNIQUE: Multidetector CT imaging of the abdomen and pelvis was performed following the standard protocol without IV contrast. COMPARISON:  None. FINDINGS: Lower chest: Moderate right and small left pleural effusions. Associated bibasilar atelectasis. Cardiomegaly. Small pericardial effusion. Hypodense blood pool relative to myocardium, suggesting anemia. Pacemaker leads, incompletely visualized. Hepatobiliary: Unenhanced liver is notable for a mildly nodular hepatic contour (series 2/image 24), suggesting cirrhosis. Gallbladder is distended but grossly unremarkable. No intrahepatic or extrahepatic ductal dilatation. Pancreas: Within normal limits. Spleen: Borderline enlargement of the spleen, measuring 14.1 cm in craniocaudal dimension (coronal image 75). Adrenals/Urinary Tract: Adrenal glands are within normal  limits. Kidneys are grossly unremarkable.  No hydronephrosis. Bladder is within normal  limits. Stomach/Bowel: Stomach is within normal limits. No evidence of bowel obstruction. Normal appendix (series 2/image 54). Mild rectal wall thickening (series 2/image 78). Vascular/Lymphatic: No evidence of abdominal aortic aneurysm. Atherosclerotic calcifications of the abdominal aorta and branch vessels. No suspicious abdominopelvic lymphadenopathy. Reproductive: Status post hysterectomy. No adnexal masses. Other: Moderate abdominopelvic ascites. Musculoskeletal: Moderate to severe compression fracture deformity at T12, with possible pathologic fracture given the lytic/sclerotic appearance. No retropulsion. IMPRESSION: Mild rectal wall thickening, nonspecific. Correlate for infectious/inflammatory prostatitis. Neoplasm is considered unlikely, but endoscopic correlation is suggested. Cirrhosis with moderate abdominopelvic ascites and borderline splenomegaly. Moderate to severe compression fracture deformity at T12, possibly pathologic. No retropulsion. Moderate right and small left pleural effusions. Electronically Signed   By: Julian Hy M.D.   On: 04/23/2018 17:19    Impression / Plan:   Impression: 1.  GI bleed: With episodes of bright red blood per rectum, similar episode earlier in January, complete anemia work-up with EGD and colonoscopy repeated x2 and capsule endoscopy at the end of last year, normal other than hemorrhoids 2.  Acute blood loss anemia: Hemoglobin 7.7 (9.02 months ago)--> 1 u prbcs-->7.7 3.  History of A. fib on Xarelto: Xarelto currently on hold due to GI bleed and Kcentra given in the ER 4.  Chronic diastolic CHF 5.  Cirrhosis: With moderate abdominal pelvic ascites and borderline splenomegaly, had a paracentesis 1/24 with removal of 2 L, abdominal exam, fluid cytology and culture were not consistent with SBP, SAAG greater than 1.1 suggestive for some portal hypertension, right upper quadrant ultrasound to confirm liver cirrhosis with patent portal vein and normal blood  flow at that time, last admission patient was continued on Lasix, Aldactone was stopped due to soft BP, currently Lasix on hold given GI bleed 6.  CKD stage III  Plan: 1.  Will discuss work-up of cirrhosis with Dr. Hilarie Fredrickson.  With moderate ascites patient may benefit from repeat paracentesis while in the hospital.  Other than this patient likely needs to follow-up with primary GI for further work-up of cirrhosis as an outpatient. 2.  Continue to monitor hemoglobin with transfusion as needed less than 7.  Per nursing, patient's blood has antibodies and another unit has been ordered but is not available yet. 3.  If patient continues with bright red blood per rectum over the next 24 hours, could proceed with a flex sigmoidoscopy for further evaluation. 4.  We will order iron studies today.  If ferritin is low, recommend IV Feraheme. 5.  Agree with holding Xarelto 6. Stopped Protonix infusion and IV Protonix and transtiitoned to oral Pantoprazole 40mg  qd given that this is BRB and likely lower GI bleed. 7. Allowing clear liquids today and will make NPO after midnight in case of further bleeding today and need of Flex sig tomorrow 8.  Please await any further recommendations from Dr. Hilarie Fredrickson later today.  Dr. Henrene Pastor will be following the rest of the week.  Thank you for your kind consultation, we will continue to follow.  Lavone Nian [facility]  04/24/2018, 8:47 AM

## 2018-04-25 ENCOUNTER — Inpatient Hospital Stay (HOSPITAL_COMMUNITY): Payer: 59

## 2018-04-25 DIAGNOSIS — Z7901 Long term (current) use of anticoagulants: Secondary | ICD-10-CM

## 2018-04-25 DIAGNOSIS — D62 Acute posthemorrhagic anemia: Secondary | ICD-10-CM

## 2018-04-25 LAB — CBC
HCT: 29.7 % — ABNORMAL LOW (ref 36.0–46.0)
Hemoglobin: 9.2 g/dL — ABNORMAL LOW (ref 12.0–15.0)
MCH: 28.2 pg (ref 26.0–34.0)
MCHC: 31 g/dL (ref 30.0–36.0)
MCV: 91.1 fL (ref 80.0–100.0)
Platelets: 255 10*3/uL (ref 150–400)
RBC: 3.26 MIL/uL — ABNORMAL LOW (ref 3.87–5.11)
RDW: 16.9 % — ABNORMAL HIGH (ref 11.5–15.5)
WBC: 7 10*3/uL (ref 4.0–10.5)
nRBC: 0 % (ref 0.0–0.2)

## 2018-04-25 LAB — BPAM RBC
Blood Product Expiration Date: 202003252359
Blood Product Expiration Date: 202003312359
ISSUE DATE / TIME: 202003020018
ISSUE DATE / TIME: 202003021627
Unit Type and Rh: 5100
Unit Type and Rh: 5100

## 2018-04-25 LAB — COMPREHENSIVE METABOLIC PANEL
ALT: 17 U/L (ref 0–44)
AST: 66 U/L — ABNORMAL HIGH (ref 15–41)
Albumin: 1.9 g/dL — ABNORMAL LOW (ref 3.5–5.0)
Alkaline Phosphatase: 80 U/L (ref 38–126)
Anion gap: 7 (ref 5–15)
BUN: 38 mg/dL — ABNORMAL HIGH (ref 8–23)
CALCIUM: 8.2 mg/dL — AB (ref 8.9–10.3)
CO2: 22 mmol/L (ref 22–32)
Chloride: 104 mmol/L (ref 98–111)
Creatinine, Ser: 1.31 mg/dL — ABNORMAL HIGH (ref 0.44–1.00)
GFR calc Af Amer: 47 mL/min — ABNORMAL LOW (ref >60)
GFR calc non Af Amer: 41 mL/min — ABNORMAL LOW (ref >60)
Glucose, Bld: 75 mg/dL (ref 70–99)
Potassium: 3.9 mmol/L (ref 3.5–5.1)
Sodium: 133 mmol/L — ABNORMAL LOW (ref 135–145)
Total Bilirubin: 3 mg/dL — ABNORMAL HIGH (ref 0.3–1.2)
Total Protein: 5.1 g/dL — ABNORMAL LOW (ref 6.5–8.1)

## 2018-04-25 LAB — GRAM STAIN

## 2018-04-25 LAB — TYPE AND SCREEN
ABO/RH(D): O POS
Antibody Screen: NEGATIVE
Donor AG Type: NEGATIVE
Donor AG Type: NEGATIVE
Unit division: 0
Unit division: 0

## 2018-04-25 LAB — BODY FLUID CELL COUNT WITH DIFFERENTIAL
Lymphs, Fluid: 11 %
Monocyte-Macrophage-Serous Fluid: 85 % (ref 50–90)
Neutrophil Count, Fluid: 4 % (ref 0–25)
Total Nucleated Cell Count, Fluid: 402 cu mm (ref 0–1000)

## 2018-04-25 LAB — URINALYSIS, ROUTINE W REFLEX MICROSCOPIC

## 2018-04-25 LAB — IRON AND TIBC
Iron: 48 ug/dL (ref 28–170)
Saturation Ratios: 34 % — ABNORMAL HIGH (ref 10.4–31.8)
TIBC: 141 ug/dL — ABNORMAL LOW (ref 250–450)
UIBC: 93 ug/dL

## 2018-04-25 LAB — GLUCOSE, CAPILLARY
GLUCOSE-CAPILLARY: 90 mg/dL (ref 70–99)
Glucose-Capillary: 75 mg/dL (ref 70–99)
Glucose-Capillary: 73 mg/dL (ref 70–99)
Glucose-Capillary: 75 mg/dL (ref 70–99)
Glucose-Capillary: 84 mg/dL (ref 70–99)
Glucose-Capillary: 77 mg/dL (ref 70–99)

## 2018-04-25 LAB — URINALYSIS, MICROSCOPIC (REFLEX)
RBC / HPF: >50 RBC/hpf (ref 0–5)
WBC, UA: >50 WBC/hpf (ref 0–5)

## 2018-04-25 LAB — ALBUMIN, PLEURAL OR PERITONEAL FLUID: Albumin, Fluid: <1 g/dL

## 2018-04-25 MED ORDER — RIVAROXABAN 20 MG PO TABS: ORAL_TABLET | ORAL | 0 refills | Status: DC

## 2018-04-25 MED ORDER — SODIUM CHLORIDE 0.9 % IV SOLN
510.0000 mg | Freq: Once | INTRAVENOUS | Status: AC
  Administered 2018-04-25: 510 mg via INTRAVENOUS
  Filled 2018-04-25: qty 17

## 2018-04-25 MED ORDER — POLYETHYLENE GLYCOL 3350 17 G PO PACK: 17.0000 g | PACK | Freq: Two times a day (BID) | ORAL | 0 refills | Status: AC

## 2018-04-25 MED ORDER — FUROSEMIDE 40 MG PO TABS: 20.0000 mg | ORAL_TABLET | Freq: Every day | ORAL | 0 refills | Status: AC

## 2018-04-25 MED ORDER — FLUCONAZOLE 100 MG PO TABS: 100.0000 mg | ORAL_TABLET | Freq: Every day | ORAL | 0 refills | Status: DC

## 2018-04-25 MED ORDER — POLYETHYLENE GLYCOL 3350 17 G PO PACK
17.0000 g | PACK | Freq: Two times a day (BID) | ORAL | Status: DC
  Administered 2018-04-25 – 2018-04-28 (×7): 17 g via ORAL
  Filled 2018-04-25 (×7): qty 1

## 2018-04-25 MED ORDER — HYDROCORTISONE 2.5 % RE CREA: TOPICAL_CREAM | RECTAL | 1 refills | Status: AC

## 2018-04-25 MED ORDER — HYDROCORTISONE ACETATE 25 MG RE SUPP: 25.0000 mg | Freq: Two times a day (BID) | RECTAL | 1 refills | Status: AC

## 2018-04-25 NOTE — Progress Notes (Signed)
CSW messaged provider via Shea Evans, paged provider and messaged provider via [employer] that pt will need a PT consult, and a signature is needed on the FL-2 that was placed by the CSW in the provider's inbox and to notify provider that pt will D/C on 3/4 due to [place] still awaiting insurance auth.   CSW awaiting return call from provider now.  2nd shift ED CSW   CSW will continue to follow for D/C needs.  Alphonse Guild. Latrail Pounders, LCSW, LCAS, CSI Clinical Social Worker Ph: [phone]

## 2018-04-25 NOTE — Progress Notes (Signed)
Initial Nutrition Assessment  DOCUMENTATION CODES:   Non-severe (moderate) malnutrition in context of chronic illness  INTERVENTION:  - Will order oral nutrition supplements if patient unable to d/c today.    NUTRITION DIAGNOSIS:   Moderate Malnutrition related to chronic illness as evidenced by mild fat depletion, mild muscle depletion, moderate muscle depletion.  GOAL:   Patient will meet greater than or equal to 90% of their needs  MONITOR:   PO intake, Weight trends, Labs, Skin  REASON FOR ASSESSMENT:   Malnutrition Screening Tool  ASSESSMENT:   73 y.o. female with history of atrial fibrillation, CHF, and DM. She was admitted in February 2020 and September 2019 for GIB. Testing during those admissions showed hemorrhoids and diverticulosis. She was brought to the ED from nursing home d/t 3 episodes of frank red blood per rectum. No abdominal pain or N/V PTA. During recent admission patient also had paracentesis and on sonogram of the abdomen showed features concerning for cirrhosis of the liver.  BMI indicates overweight status, appropriate for age. Diet advanced from NPO to Heart Healthy at 10:00 AM following paracentesis this AM during which 2.7 L yellow fluid was removed. Patient sleeping at the time of RD visit with no family/visitors present. Patient awoke after name call x5 and hand rub. Patient reports recently being given pain medication which is making her very drowsy. She denies any abdominal pain or nausea at the time of RD visit.   Patient states decreased appetite for prolonged period (unable to be more specific on time frame) PTA. She feels that this is d/t not liking the food items serviced at the nursing home. She states that they often serve items like BBQ and fried chicken but she prefers sandwiches such as peanut butter and banana sandwiches and pimento cheese sandwiches. She denies any chewing or swallowing difficulties.   Per chart review, current weight  (from 3/2) is recorded as 162 lb and admission weight (3/1) as 225 lb. Weight on 2/3 was 227 lb. Unsure if weight recorded 3/2 was accurate.    Medications reviewed; 12.5 g albumin x1 dose 3/3, 510 mg feraheme x1 dose 3/3, 1 packet miralax BID, 10 mEq IV KCl x4 runs 3/2. Labs reviewed; CBGs: 75 and 73 mg/dl today, Na: 133 mmol/l, creatinine: 1.31 mg/dl, Ca: 8.2 mg/dl, AST elevated, GFR: 41 ml/min.      NUTRITION - FOCUSED PHYSICAL EXAM:    Most Recent Value  Orbital Region  Mild depletion  Upper Arm Region  Mild depletion  Thoracic and Lumbar Region  Unable to assess  Buccal Region  Mild depletion  Temple Region  Mild depletion  Clavicle Bone Region  Moderate depletion  Clavicle and Acromion Bone Region  Moderate depletion  Scapular Bone Region  Unable to assess  Dorsal Hand  No depletion  Patellar Region  No depletion  Anterior Thigh Region  No depletion  Posterior Calf Region  Unable to assess  Edema (RD Assessment)  Unable to assess  Hair  Reviewed  Eyes  Reviewed  Mouth  Reviewed  Skin  Reviewed  Nails  Reviewed       Diet Order:   Diet Order            Diet Heart Room service appropriate? Yes; Fluid consistency: Thin  Diet effective now        Diet - low sodium heart healthy              EDUCATION NEEDS:   Not appropriate for education at  this time  Skin:  Skin Assessment: Skin Integrity Issues: Skin Integrity Issues:: Stage II, Stage III, Stage IV Stage II: sacrum x2 Stage III: L hip Stage IV: sacrum  Last BM:  3/2  Height:   Ht Readings from Last 1 Encounters:  04/24/18 5' 4.5" (1.638 m)    Weight:   Wt Readings from Last 1 Encounters:  04/24/18 73.7 kg    Ideal Body Weight:  55.68 kg  BMI:  Body mass index is 27.46 kg/m.  Estimated Nutritional Needs:   Kcal:  1475-1620 kcal  Protein:  60-70 grams  Fluid:  >/= 1.6 L/day     Jarome Matin, MS, RD, LDN, [facility] Inpatient Clinical Dietitian Pager # [phone] After hours/weekend  pager # [phone]

## 2018-04-25 NOTE — NC FL2 (Addendum)
[place] LEVEL OF CARE SCREENING TOOL     IDENTIFICATION  Patient Name: Latasha Guzman Birthdate: [birth year] Sex: female Admission Date (Current Location): 04/23/2018  [place] and [place] Number:  Kathleen Argue [id number] [place] and Address:  [facility],  [address] [place], [place]      Provider Number: [id number]  Attending Physician Name and Address:  Georgette Shell, MD  Relative Name and Phone Number:       Current Level of Care: Hospital Recommended Level of Care: [place] Prior Approval Number:    Date Approved/Denied:   PASRR Number: [id number]  Discharge Plan: SNF    Current Diagnoses: Patient Active Problem List   Diagnosis Date Noted  . Chronic anticoagulation   . Ascites   . Cirrhosis of liver with ascites ([place])   . Rectal bleeding   . Grade III hemorrhoids   . Symptomatic anemia 04/23/2018  . Anasarca   . Moderate malnutrition ([place]) 03/21/2018  . Hematochezia   . GI bleed 03/04/2018  . Acute diastolic CHF (congestive heart failure) ([place]) 03/04/2018  . Acute GI bleeding 03/04/2018  . Pressure injury of skin 03/04/2018  . Shortness of breath   . A-fib ([place]) 12/27/2017  . Atrial fibrillation with RVR ([place]) 12/27/2017  . Chronic diastolic CHF (congestive heart failure) ([place]) 12/27/2017  . Lymphedema of both lower extremities 12/27/2017  . Hyperlipidemia associated with type 2 diabetes mellitus ([place]) 12/27/2017  . Acute blood loss anemia 08/02/2017  . Pneumonia of right lower lobe due to infectious organism ([place]) 08/02/2017  . Skin rash 08/02/2017  . Diabetes mellitus ([place]) 07/27/2017  . GERD (gastroesophageal reflux disease) 07/27/2017  . History of GI bleed 07/24/2017  . Chronic atrial fibrillation 10/15/2015  . Essential hypertension 10/15/2015  . Pacemaker 10/15/2015  . Type 2 diabetes mellitus without complication ([place]) 35/00/9381    Orientation RESPIRATION BLADDER Height & Weight      Self, Time, Situation, Place  Normal Incontinent Weight: 162 lb 7.7 oz (73.7 kg) Height:  5' 4.5" (163.8 cm)  BEHAVIORAL SYMPTOMS/MOOD NEUROLOGICAL BOWEL NUTRITION STATUS      Incontinent Diet(Heart Healthy Diet)  AMBULATORY STATUS COMMUNICATION OF NEEDS Skin   Extensive Assist Verbally (Stage 4 on the sacrum and two smaller moisture-assiociated wounds on left and right buttock and a pressure wound on her left hip)                       Personal Care Assistance Level of Assistance  Bathing, Dressing Bathing Assistance: Maximum assistance   Dressing Assistance: Maximum assistance     Functional Limitations Info             SPECIAL CARE FACTORS FREQUENCY  PT (By licensed PT), OT (By licensed OT)(Type  2 diabetic)     PT Frequency: 5 OT Frequency: 5            Contractures Contractures Info: Not present    Additional Factors Info  Code Status, Allergies Code Status Info: FULL Allergies Info: Iodine, Iodinated Diagnostic Agents, Penicillins           Current Medications (04/25/2018):  This is the current hospital active medication list Current Facility-Administered Medications  Medication Dose Route Frequency Provider Last Rate Last Dose  . 0.9 %  sodium chloride infusion   Intravenous Continuous Rise Patience, MD      . acetaminophen (TYLENOL) tablet 650 mg  650 mg Oral Q6H PRN Rise Patience, MD  650 mg at 04/24/18 1616   Or  . acetaminophen (TYLENOL) suppository 650 mg  650 mg Rectal Q6H PRN Rise Patience, MD      . Chlorhexidine Gluconate Cloth 2 % PADS 6 each  6 each Topical Daily Rise Patience, MD   6 each at 04/25/18 [phone]  . diltiazem (CARDIZEM) tablet 60 mg  60 mg Oral Q6H Rise Patience, MD   60 mg at 04/25/18 1721  . hydrocortisone (ANUSOL-HC) 2.5 % rectal cream   Rectal BID Georgette Shell, MD      . ipratropium-albuterol (DUONEB) 0.5-2.5 (3) MG/3ML nebulizer solution 3 mL  3 mL Nebulization Q6H PRN Rise Patience, MD      . MEDLINE mouth rinse  15 mL Mouth Rinse BID Rise Patience, MD   15 mL at 04/24/18 2147  . metoprolol tartrate (LOPRESSOR) tablet 12.5 mg  12.5 mg Oral BID Rise Patience, MD   12.5 mg at 04/25/18 0855  . ondansetron (ZOFRAN) tablet 4 mg  4 mg Oral Q6H PRN Rise Patience, MD       Or  . ondansetron [facility]) injection 4 mg  4 mg Intravenous Q6H PRN Rise Patience, MD      . pantoprazole (PROTONIX) EC tablet 40 mg  40 mg Oral Daily Ellouise Newer [place], [place]   40 mg at 04/25/18 0855  . polyethylene glycol (MIRALAX / GLYCOLAX) packet 17 g  17 g Oral BID Carl Best M, NP   17 g at 04/25/18 1137  . traMADol (ULTRAM) tablet 50 mg  50 mg Oral Q6H PRN Georgette Shell, MD   50 mg at 04/25/18 1630  . traZODone (DESYREL) tablet 25 mg  25 mg Oral QHS Rise Patience, MD   25 mg at 04/24/18 2146     Discharge Medications: Please see discharge summary for a list of discharge medications.  Relevant Imaging Results:  Relevant Lab Results:   Additional Information [id number]  Alphonse Guild [place], LCSWA

## 2018-04-25 NOTE — Progress Notes (Addendum)
CSW received a call from pt's RN stating pt has been ready for D/C for some time but that not social work consult had been placed.  CSW to call [facility] to facilitate a return to the SNF.  CSW called Tammy in admissions at [facility] at ph: [phone] who stated another insurance Josem Kaufmann will be needed, as well as another PT consult and FL-2.  Tammy is checking to insure pt has authorized insurance days left now.  5:50 PM CSW received a call from [place] in admissions at [facility] at ph: [phone] stating:  1. The [employer] ins Josem Kaufmann will have to be begun on 3/4.  2.  A FL-2 will be needed.  3.  A new PT recommendation will be needed.  CSW will continue to follow for D/C needs.  Alphonse Guild. Duy Lemming, LCSW, LCAS, CSI Clinical Social Worker Ph: [phone]

## 2018-04-25 NOTE — Progress Notes (Signed)
CSW paged the provider at ph: [phone] to request that a PT consult be placed.  CSW will continue to follow for D/C needs.  Alphonse Guild. Merryl Buckels, LCSW, LCAS, CSI Clinical Social Worker Ph: [phone]

## 2018-04-25 NOTE — Procedures (Signed)
Ultrasound-guided diagnostic and therapeutic paracentesis performed yielding 2.7 liters of yellow fluid. No immediate complications. A portion of the fluid was sent to the lab for preordered studies. EBL none.

## 2018-04-25 NOTE — Progress Notes (Addendum)
[facility] Progress Note  CC:  Rectal bleeding   Subjective: Slept well last night after taking a pain pill. No abdominal or rectal pain this morning. Nurse reports small amount of blood when wiping rectum last night. No BM or blood per the rectum this am.   Objective:  Vital signs in last 24 hours: Temp:  [98.2 F (36.8 C)-98.8 F (37.1 C)] 98.6 F (37 C) (03/03 0800) Pulse Rate:  [84-125] 114 (03/03 0600) Resp:  [11-25] 16 (03/03 0600) BP: (88-128)/(52-92) 108/75 (03/03 0616) SpO2:  [95 %-100 %] 97 % (03/03 0600) Last BM Date: 04/24/18 General:   Alert and cooperative in NAD Eyes: slcera nonicteric, PERRLA Heart:  Tachycardic, no murmur Pulm: lungs clear throughout Abdomen:  Soft, nontender, ascites, abdomen is not tense, dull to percussion, + BS x 4 quads   Extremities: bilateral edema from thighs to feet, SCDs in use, bilateral boots in use Neurologic:  Alert and  oriented x4;  grossly normal neurologically. No Asterixis.  Psych:  Alert and cooperative. Normal mood and affect.  Intake/Output from previous day: 03/02 0701 - 03/03 0700 In: 1492.2 [P.O.:500; I.V.:91.3; Blood:630; IV Piggyback:270.8] Out: 1125 [Urine:1125] Intake/Output this shift: No intake/output data recorded.  Lab Results: Recent Labs    04/23/18 1703 04/24/18 0541 04/25/18 0335  WBC 12.3* 8.9 7.0  HGB 7.7* 7.7* 9.2*  HCT 25.6* 25.0* 29.7*  PLT 495* 243 255   BMET Recent Labs    04/23/18 1703 04/24/18 0541 04/25/18 0335  NA 134* 135 133*  K 4.1 3.2* 3.9  CL 101 103 104  CO2 25 26 22   GLUCOSE 94 76 75  BUN 37* 39* 38*  CREATININE 1.31* 1.26* 1.31*  CALCIUM 8.5* 7.9* 8.2*   LFT Recent Labs    04/24/18 0541 04/25/18 0335  PROT 4.7* 5.1*  ALBUMIN 1.8* 1.9*  AST 64* 66*  ALT 15 17  ALKPHOS 75 80  BILITOT 2.3* 3.0*  BILIDIR 1.4*  --   IBILI 0.9  --    PT/INR Recent Labs    04/23/18 1703  LABPROT 23.0*  INR 2.1*    Ct Abdomen Pelvis Wo  Contrast  Result Date: 04/23/2018 CLINICAL DATA:  Intermittent GI bleeding EXAM: CT ABDOMEN AND PELVIS WITHOUT CONTRAST TECHNIQUE: Multidetector CT imaging of the abdomen and pelvis was performed following the standard protocol without IV contrast. COMPARISON:  None. FINDINGS: Lower chest: Moderate right and small left pleural effusions. Associated bibasilar atelectasis. Cardiomegaly. Small pericardial effusion. Hypodense blood pool relative to myocardium, suggesting anemia. Pacemaker leads, incompletely visualized. Hepatobiliary: Unenhanced liver is notable for a mildly nodular hepatic contour (series 2/image 24), suggesting cirrhosis. Gallbladder is distended but grossly unremarkable. No intrahepatic or extrahepatic ductal dilatation. Pancreas: Within normal limits. Spleen: Borderline enlargement of the spleen, measuring 14.1 cm in craniocaudal dimension (coronal image 75). Adrenals/Urinary Tract: Adrenal glands are within normal limits. Kidneys are grossly unremarkable.  No hydronephrosis. Bladder is within normal limits. Stomach/Bowel: Stomach is within normal limits. No evidence of bowel obstruction. Normal appendix (series 2/image 54). Mild rectal wall thickening (series 2/image 78). Vascular/Lymphatic: No evidence of abdominal aortic aneurysm. Atherosclerotic calcifications of the abdominal aorta and branch vessels. No suspicious abdominopelvic lymphadenopathy. Reproductive: Status post hysterectomy. No adnexal masses. Other: Moderate abdominopelvic ascites. Musculoskeletal: Moderate to severe compression fracture deformity at T12, with possible pathologic fracture given the lytic/sclerotic appearance. No retropulsion. IMPRESSION: Mild rectal wall thickening, nonspecific. Correlate for infectious/inflammatory prostatitis. Neoplasm is considered unlikely, but endoscopic correlation is suggested. Cirrhosis with  moderate abdominopelvic ascites and borderline splenomegaly. Moderate to severe compression fracture  deformity at T12, possibly pathologic. No retropulsion. Moderate right and small left pleural effusions. Electronically Signed   By: Julian Hy M.D.   On: 04/23/2018 17:19    Assessment / Plan: 1. Hematochezia secondary to external hemorrhoids vs stercoral ulcer vs diverticular bleeding. complete anemia work-up with EGD and colonoscopy repeated x 2 and capsule endoscopy at the end of last year. A/P CT showed mild rectal wall thickening.  -consider flexible sigmoidoscopy if rectal bleeding persists or worsens -follow H/H -contnue Hydrocortisone Suppository and Analpram -Miralax 17gm po bid  -ok to start heart healthy diet -ok to discharge home/SNF as verified by Dr. Henrene Pastor  2. Anemia secondary to hematochezia and cirrhosis. Received 2 units PRBCs 3.1-04/24/2018. Hg 9.2 up from 7.7. Iron 48.  -follow H/H -continue to monitor rectal bleeding    3. Cirrhosis: A/P CT 04/23/2018 showed moderate amount of ascites, mild splenomegaly, liver with nodular contour consistent with AST > ALT. AST 66. ALT 17. Albumin 1.9. PLT normal. -paracentesis ordered, to check ascitic fluid for cell count, culture, albumin and cytology  -patient to follow up with her GI Dr. Lyda Jester once discharged    4. Atrial fibrillation -ok to restart Xarelto in 5 days  5. Chronic Diastolic CHF -diuretics on hold   We will sign off service, patient ok for discharge back to SNF   LOS: 2 days   Noralyn Pick  04/25/2018, 8:55 AM   GI ATTENDING  Interval history and data reviewed.  Patient personally seen and examined.  Agree with interval progress note as outlined above.  Minimal bleeding.  Based on history her bleeding is almost certainly secondary to a fissure or tender hemorrhoid.  Stercoral ulcer another possibility.  In any event, the treatment is keeping her bowels soft and regular (another words avoid constipation and the passage of hard stools).  Continue medicated suppositories.  Hold anticoagulation  therapy for 5 days then resume.  Paracentesis does not show SBP.  No additional recommendations from GI medicine.  She will resume her GI outpatient care with her primary GI provider.  Okay for discharge from a GI perspective.  We will sign off.  Thank you  Docia Chuck. Geri [place]., M.D. [facility] Gastroenterology

## 2018-04-25 NOTE — Progress Notes (Signed)
CSW retrieved PASRR # from chart:   [id number]  CSW will continue to follow for D/C needs.  Alphonse Guild. Kharma Sampsel, LCSW, LCAS, CSI Clinical Social Worker Ph: [phone]

## 2018-04-25 NOTE — Discharge Summary (Addendum)
Physician Discharge Summary  JILLAINE WAREN OJJ:[id number] DOB: 09-13-1945 DOA: 04/23/2018  PCP: Nicoletta Dress, MD  Admit date: 04/23/2018 Discharge date: 04/25/2018  Admitted From: Nursing home Disposition: Nursing home  Recommendations for Outpatient Follow-up:  1. Follow up with PCP in 1-2 weeks 2. Please obtain BMP/CBC in one week to follow-up on hemoglobin and renal functions 3. Please follow up with Dr. Lyda Jester with GI in 2 to 4 weeks 4. Please make sure that she is NOT constipated at the facility.  This will prevent readmission to hospitalist with GI bleed.  Patient has hemorrhoids which has bled due to constipation.  Make sure she is on MiraLAX twice a day.  Her Xarelto can be restarted 04/29/2018  Home Health: None  equipment/Devices: None  Discharge Condition stable  CODE STATUS: Full code Diet recommendation cardiac  brief/Interim Summary:73 y.o.femalewithhistory of atrial fibrillation on Xarelto, diastolic CHF, diabetes mellitus who was admitted last month for GI bleed and also in September 2019 for GI bleed at that time patient had undergone EGD and colonoscopy which showed hemorrhoids and diverticulosis subsequent which patient also had capsule endoscopy which was unremarkable was brought to the ER after patient had 3 episodes of frank rectal bleed at her living facility. Patient denies any abdominal pain nausea or vomiting. During recent admission patient also had paracentesis and on sonogram of the abdomen showed features concerning for cirrhosis of the liver.  ED Course:In the ER patient was mildly hypotensive was given fluids following which blood pressure improved. Hemoglobin is dropped about 2 g from recent 9 g. And since patient is on Xarelto was given Wandalee Ferdinand for reversal. On-call legal gastroenterologist was consulted by ER physician and patient ordered 1 unit of PRBC transfusion. Admitted for further management  Discharge Diagnoses:  Principal  Problem:   Acute GI bleeding Active Problems:   Essential hypertension   Chronic diastolic CHF (congestive heart failure) (HCC)   Symptomatic anemia   Ascites   Cirrhosis of liver with ascites (HCC)   Rectal bleeding   Grade III hemorrhoids  #1 lower GI bleed most likely from hemorrhoids.  Patient received 1 unit of blood transfusion during his hospital stay and her hemoglobin remained stable.  She was seen in consultation by GI thought that this bleeding could have been secondary to hemorrhoids versus stercoral ulcer versus diverticular bleeding however her bleeding has subsided at this time.  She needs to stay on her hydrocortisone suppository and ANALPRAM for the hemorrhoids.   She needs to be also on MiraLAX 17 g twice a day.  She will follow-up with her GI doctor  Meisenheimer .  Past GI work-up EGD colonoscopy and capsule endoscopy showed diverticulosis and hemorrhoids.  Patient has chronic anemia and has received blood transfusion on and off.  In addition to that she is on Xarelto for atrial fibrillation which is on hold at this time.   #2 history of atrial fibrillation on Xarelto which is on hold.  Her rate has always been in the high 90s to 100s in now and in the past.  And she runs a soft blood pressure.  Restart Cardizem 60 mg every 6 hours.  She is also on Lopressor 12.5 mg twice a day.  Monitor blood pressure and adjust the dose of Cardizem and Lopressor as an outpatient.  Restart Xarelto 04/29/2018.  #3 congestive heart failure diastolic-stable at this time  She takes 40 mg Lasix daily.  #4 cirrhosis with ascites.Patient had paracentesis done.  #5 CKD stage  III stable monitor  #6 stage IV sacral decub present prior to admission patient was receiving hydrotherapy at the nursing home which has been done and is getting wound care at the nursing home.  Continue wound care.  Patient was seen by wound care team during this hospital stay.  Patient was also started on Diflucan for  perineal yeast.  Continue for 5 days.  Sacral Stage 4:  10cm x 9cm x 1.6cm with ruddy red, friable wound bed, and light yellow exudate in a moderate amount. Left hip full thickness wound: 2.8cm round x 1cm depth with yellow, necrotic wound bed and moderate amount light yellow exudate. Wound bed:As described above Drainage (amount, consistency, odor) As described above Periwound:intact, macerated in perineum with erythema  Dressing procedure/placement/frequency: Patient is on a mattress replacement with low air loss as she is in our ICU.  I have asked that she be placed upon a mattress replacement upon transfer to the floor. We will conservatively fill the defects with a saline moistened gauze dressing followed by a dry gauze topper and secure with paper tape. Patient is to be turned and repositioned from side to side with time in the supine position minimized.  I have provided prevalon pressure redistribution heel boots to both float the heels (which are intact) and to correct the lateral rotation of the right LE.  #6 urinary retention patient had Foley catheter placed please follow-up with the facility do voiding trials and DC Foley catheter if patient able to urinate on her own.   Pressure Injury 03/04/18 Stage IV - Full thickness tissue loss with exposed bone, tendon or muscle. (Active)  03/04/18 0500  Location: Sacrum  Location Orientation: Mid  Staging: Stage IV - Full thickness tissue loss with exposed bone, tendon or muscle.  Wound Description (Comments):   Present on Admission: Yes     Pressure Injury 03/06/18 Stage II -  Partial thickness loss of dermis presenting as a shallow open ulcer with a red, pink wound bed without slough. (Active)  03/06/18 0721  Location: Sacrum  Location Orientation: Mid;Left  Staging: Stage II -  Partial thickness loss of dermis presenting as a shallow open ulcer with a red, pink wound bed without slough.  Wound Description (Comments):   Present on  Admission: Yes     Pressure Injury 03/24/18 Stage II -  Partial thickness loss of dermis presenting as a shallow open ulcer with a red, pink wound bed without slough. (Active)  03/24/18 1430  Location: Sacrum  Location Orientation: Mid;Right  Staging: Stage II -  Partial thickness loss of dermis presenting as a shallow open ulcer with a red, pink wound bed without slough.  Wound Description (Comments):   Present on Admission: Yes     Pressure Injury 04/23/18 Stage III -  Full thickness tissue loss. Subcutaneous fat may be visible but bone, tendon or muscle are NOT exposed. (Active)  04/23/18 2100  Location: Hip  Location Orientation: Left  Staging: Stage III -  Full thickness tissue loss. Subcutaneous fat may be visible but bone, tendon or muscle are NOT exposed.  Wound Description (Comments):   Present on Admission: Yes                    Estimated body mass index is 27.46 kg/m as calculated from the following:   Height as of this encounter: 5' 4.5" (1.638 m).   Weight as of this encounter: 73.7 kg.  Discharge Instructions  Discharge Instructions  Call MD for:  difficulty breathing, headache or visual disturbances   Complete by:  As directed    Call MD for:  redness, tenderness, or signs of infection (pain, swelling, redness, odor or green/yellow discharge around incision site)   Complete by:  As directed    Call MD for:  severe uncontrolled pain   Complete by:  As directed    Call MD for:  temperature >100.4   Complete by:  As directed    Diet - low sodium heart healthy   Complete by:  As directed    Increase activity slowly   Complete by:  As directed      Allergies as of 04/25/2018      Reactions   Iodine Hives, Other (See Comments), Shortness Of Breath   Throat swells   Iodinated Diagnostic Agents    Penicillins Hives   Has patient had a PCN reaction causing immediate rash, facial/tongue/throat swelling, SOB or lightheadedness with hypotension:  Yes Has patient had a PCN reaction causing severe rash involving mucus membranes or skin necrosis: No Has patient had a PCN reaction that required hospitalization: No Has patient had a PCN reaction occurring within the last 10 years: No If all of the above answers are "NO", then may proceed with Cephalosporin use.      Medication List    STOP taking these medications   collagenase ointment Commonly known as:  SANTYL   lip balm ointment     TAKE these medications   atorvastatin 80 MG tablet Commonly known as:  LIPITOR Take 80 mg by mouth at bedtime.   cholecalciferol 25 MCG (1000 UT) tablet Commonly known as:  VITAMIN D3 Take 1,000 Units by mouth daily.   diltiazem 60 MG tablet Commonly known as:  CARDIZEM Take 1 tablet (60 mg total) by mouth every 6 (six) hours.   docusate sodium 100 MG capsule Commonly known as:  COLACE Take 100 mg by mouth 2 (two) times daily.   eucerin cream Apply 1 application topically daily.   fenofibrate 160 MG tablet Take 160 mg by mouth every morning.   fluconazole 100 MG tablet Commonly known as:  DIFLUCAN Take 1 tablet (100 mg total) by mouth daily for 5 days.   furosemide 40 MG tablet Commonly known as:  LASIX Take 0.5 tablets (20 mg total) by mouth daily. What changed:  how much to take   hydrocortisone 2.5 % rectal cream Commonly known as:  ANUSOL-HC Apply rectally 2 times daily What changed:    how to take this  when to take this  additional instructions   hydrocortisone 25 MG suppository Commonly known as:  ANUSOL-HC Place 1 suppository (25 mg total) rectally every 12 (twelve) hours.   ipratropium-albuterol 0.5-2.5 (3) MG/3ML Soln Commonly known as:  DUONEB Take 3 mLs by nebulization every 6 (six) hours. Pt also takes as needed What changed:  Another medication with the same name was removed. Continue taking this medication, and follow the directions you see here.   metoprolol tartrate 25 MG tablet Commonly known  as:  LOPRESSOR Take 0.5 tablets (12.5 mg total) by mouth 2 (two) times daily.   multivitamin with minerals Tabs tablet Take 1 tablet by mouth daily.   NUTRITIONAL SUPPLEMENT PLUS Liqd Take 30 mLs by mouth daily. Med pass   ENSURE PLUS Liqd Take 237 mLs by mouth 2 (two) times daily between meals.   omeprazole 20 MG capsule Commonly known as:  PRILOSEC Take 20 mg by mouth every evening.  polyethylene glycol packet Commonly known as:  MIRALAX / GLYCOLAX Take 17 g by mouth 2 (two) times daily. What changed:  when to take this   PROAIR HFA 108 (90 Base) MCG/ACT inhaler Generic drug:  albuterol Inhale 2 puffs into the lungs every 6 (six) hours as needed for wheezing or shortness of breath.   rivaroxaban 20 MG Tabs tablet Commonly known as:  XARELTO Start taking 1 tablet daily 04/29/2018 What changed:    how much to take  how to take this  when to take this  additional instructions   senna 8.6 MG Tabs tablet Commonly known as:  SENOKOT Take 2 tablets by mouth at bedtime.   simethicone 80 MG chewable tablet Commonly known as:  MYLICON Chew 80 mg by mouth every 6 (six) hours as needed for flatulence.   sodium phosphate Pediatric 3.5-9.5 GM/59ML enema Place 1 enema rectally once as needed for moderate constipation or severe constipation.   traMADol 50 MG tablet Commonly known as:  ULTRAM Take 1 tablet (50 mg total) by mouth every 12 (twelve) hours as needed for moderate pain. What changed:  when to take this   traZODone 50 MG tablet Commonly known as:  DESYREL Take 0.5 tablets (25 mg total) by mouth at bedtime as needed for sleep. What changed:  when to take this      Follow-up Information    Nicoletta Dress, MD Follow up.   Specialty:  Internal Medicine Contact information: [address] [place] [address] [phone]        Park Liter, MD .   Specialty:  Cardiology Contact information: [place] [address] [phone]          Allergies  Allergen Reactions  . Iodine Hives, Other (See Comments) and Shortness Of Breath    Throat swells  . Iodinated Diagnostic Agents   . Penicillins Hives    Has patient had a PCN reaction causing immediate rash, facial/tongue/throat swelling, SOB or lightheadedness with hypotension: Yes Has patient had a PCN reaction causing severe rash involving mucus membranes or skin necrosis: No Has patient had a PCN reaction that required hospitalization: No Has patient had a PCN reaction occurring within the last 10 years: No If all of the above answers are "NO", then may proceed with Cephalosporin use.     Consultations: gi  Procedures/Studies: Ct Abdomen Pelvis Wo Contrast  Result Date: 04/23/2018 CLINICAL DATA:  Intermittent GI bleeding EXAM: CT ABDOMEN AND PELVIS WITHOUT CONTRAST TECHNIQUE: Multidetector CT imaging of the abdomen and pelvis was performed following the standard protocol without IV contrast. COMPARISON:  None. FINDINGS: Lower chest: Moderate right and small left pleural effusions. Associated bibasilar atelectasis. Cardiomegaly. Small pericardial effusion. Hypodense blood pool relative to myocardium, suggesting anemia. Pacemaker leads, incompletely visualized. Hepatobiliary: Unenhanced liver is notable for a mildly nodular hepatic contour (series 2/image 24), suggesting cirrhosis. Gallbladder is distended but grossly unremarkable. No intrahepatic or extrahepatic ductal dilatation. Pancreas: Within normal limits. Spleen: Borderline enlargement of the spleen, measuring 14.1 cm in craniocaudal dimension (coronal image 75). Adrenals/Urinary Tract: Adrenal glands are within normal limits. Kidneys are grossly unremarkable.  No hydronephrosis. Bladder is within normal limits. Stomach/Bowel: Stomach is within normal limits. No evidence of bowel obstruction. Normal appendix (series 2/image 54). Mild rectal wall thickening (series  2/image 78). Vascular/Lymphatic: No evidence of abdominal aortic aneurysm. Atherosclerotic calcifications of the abdominal aorta and branch vessels. No suspicious abdominopelvic lymphadenopathy. Reproductive: Status post hysterectomy. No adnexal  masses. Other: Moderate abdominopelvic ascites. Musculoskeletal: Moderate to severe compression fracture deformity at T12, with possible pathologic fracture given the lytic/sclerotic appearance. No retropulsion. IMPRESSION: Mild rectal wall thickening, nonspecific. Correlate for infectious/inflammatory prostatitis. Neoplasm is considered unlikely, but endoscopic correlation is suggested. Cirrhosis with moderate abdominopelvic ascites and borderline splenomegaly. Moderate to severe compression fracture deformity at T12, possibly pathologic. No retropulsion. Moderate right and small left pleural effusions. Electronically Signed   By: Julian Hy M.D.   On: 04/23/2018 17:19   (Echo, Carotid, EGD, Colonoscopy, ERCP)    Subjective: Patient resting in bed anxious to be discharged back to the nursing home denies chest pain shortness of breath nausea vomiting has been having bowel movements.  Discharge Exam: Vitals:   04/25/18 1005 04/25/18 1023  BP: 107/75 106/78  Pulse:    Resp:    Temp:    SpO2:     Vitals:   04/25/18 0616 04/25/18 0800 04/25/18 1005 04/25/18 1023  BP: 108/75  107/75 106/78  Pulse:      Resp:      Temp:  98.6 F (37 C)    TempSrc:  Oral    SpO2:      Weight:      Height:        General: Pt is alert, awake, not in acute distress Cardiovascular: RRR, S1/S2 +, no rubs, no gallops Respiratory: CTA bilaterally, no wheezing, no rhonchi Abdominal: Soft, NT, ND, bowel sounds + Extremities: no edema, no cyanosis    The results of significant diagnostics from this hospitalization (including imaging, microbiology, ancillary and laboratory) are listed below for reference.     Microbiology: Recent Results (from the past 240  hour(s))  MRSA PCR Screening     Status: None   Collection Time: 04/24/18  2:38 AM  Result Value Ref Range Status   MRSA by PCR NEGATIVE NEGATIVE Final    Comment:        The GeneXpert MRSA Assay (FDA approved for NASAL specimens only), is one component of a comprehensive MRSA colonization surveillance program. It is not intended to diagnose MRSA infection nor to guide or monitor treatment for MRSA infections. Performed at [facility], [place] [address] [place],  [address]      Labs: BNP (last 3 results) Recent Labs    12/27/17 1258 03/04/18 0844  BNP 540.2* 932.3*   Basic Metabolic Panel: Recent Labs  Lab 04/23/18 1703 04/24/18 0541 04/25/18 0335  NA 134* 135 133*  K 4.1 3.2* 3.9  CL 101 103 104  CO2 25 26 22   GLUCOSE 94 76 75  BUN 37* 39* 38*  CREATININE 1.31* 1.26* 1.31*  CALCIUM 8.5* 7.9* 8.2*   Liver Function Tests: Recent Labs  Lab 04/23/18 1703 04/24/18 0541 04/25/18 0335  AST 69* 64* 66*  ALT 15 15 17   ALKPHOS 93 75 80  BILITOT 2.4* 2.3* 3.0*  PROT 5.4* 4.7* 5.1*  ALBUMIN 1.9* 1.8* 1.9*   Recent Labs  Lab 04/23/18 1703  LIPASE 39   No results for input(s): AMMONIA in the last 168 hours. CBC: Recent Labs  Lab 04/23/18 1703 04/24/18 0541 04/25/18 0335  WBC 12.3* 8.9 7.0  NEUTROABS 9.2*  --   --   HGB 7.7* 7.7* 9.2*  HCT 25.6* 25.0* 29.7*  MCV 95.2 94.7 91.1  PLT 495* 243 255   Cardiac Enzymes: No results for input(s): CKTOTAL, CKMB, CKMBINDEX, TROPONINI in the last 168 hours. BNP: Invalid input(s): POCBNP CBG: Recent Labs  Lab 04/24/18  2017 04/24/18 2334 04/25/18 0359 04/25/18 0735  GLUCAP 80 70 75 73   D-Dimer No results for input(s): DDIMER in the last 72 hours. Hgb A1c No results for input(s): HGBA1C in the last 72 hours. Lipid Profile No results for input(s): CHOL, HDL, LDLCALC, TRIG, CHOLHDL, LDLDIRECT in the last 72 hours. Thyroid function studies No results for input(s): TSH, T4TOTAL,  T3FREE, THYROIDAB in the last 72 hours.  Invalid input(s): FREET3 Anemia work up [employer]    04/25/18 0814  TIBC 141*  IRON 48   Urinalysis No results found for: COLORURINE, APPEARANCEUR, [place], [place], [place], [place], [place], [place], PROTEINUR, UROBILINOGEN, NITRITE, LEUKOCYTESUR Sepsis Labs Invalid input(s): PROCALCITONIN,  WBC,  LACTICIDVEN Microbiology Recent Results (from the past 240 hour(s))  MRSA PCR Screening     Status: None   Collection Time: 04/24/18  2:38 AM  Result Value Ref Range Status   MRSA by PCR NEGATIVE NEGATIVE Final    Comment:        The GeneXpert MRSA Assay (FDA approved for NASAL specimens only), is one component of a comprehensive MRSA colonization surveillance program. It is not intended to diagnose MRSA infection nor to guide or monitor treatment for MRSA infections. Performed at [facility], [place] [address], [place], [place] [address]      Time coordinating discharge: 33  minutes  SIGNED:   Georgette Shell, MD  [facility] 04/25/2018, 10:57 AM Pager   If 7PM-7AM, please contact night-coverage [email or web address] Password [email or web address]

## 2018-04-26 DIAGNOSIS — K7460 Unspecified cirrhosis of liver: Secondary | ICD-10-CM

## 2018-04-26 DIAGNOSIS — I5032 Chronic diastolic (congestive) heart failure: Secondary | ICD-10-CM

## 2018-04-26 DIAGNOSIS — R188 Other ascites: Secondary | ICD-10-CM

## 2018-04-26 DIAGNOSIS — I10 Essential (primary) hypertension: Secondary | ICD-10-CM

## 2018-04-26 LAB — GLUCOSE, CAPILLARY
Glucose-Capillary: 74 mg/dL (ref 70–99)
Glucose-Capillary: 79 mg/dL (ref 70–99)
Glucose-Capillary: 107 mg/dL — ABNORMAL HIGH (ref 70–99)
Glucose-Capillary: 90 mg/dL (ref 70–99)
Glucose-Capillary: 94 mg/dL (ref 70–99)

## 2018-04-26 LAB — URINALYSIS, ROUTINE W REFLEX MICROSCOPIC
Bilirubin Urine: NEGATIVE
Glucose, UA: NEGATIVE mg/dL
KETONES UR: NEGATIVE mg/dL
Nitrite: NEGATIVE
PH: 5 (ref 5.0–8.0)
Protein, ur: NEGATIVE mg/dL
RBC / HPF: >50 RBC/hpf — ABNORMAL HIGH (ref 0–5)
Specific Gravity, Urine: 1.014 (ref 1.005–1.030)
WBC, UA: >50 WBC/hpf — ABNORMAL HIGH (ref 0–5)

## 2018-04-26 LAB — URINE CULTURE

## 2018-04-26 MED ORDER — BISMUTH SUBSALICYLATE 262 MG PO CHEW
524.0000 mg | CHEWABLE_TABLET | ORAL | Status: DC | PRN
  Filled 2018-04-26: qty 2

## 2018-04-26 NOTE — Progress Notes (Signed)
LCSW following for return to facility.   Patient was not dc 3/3 because daytime CSW was not consulted or notified by phone/text that patient was ready for dc.   Patient will need updated pt note for insurance auth prior to discharging to facility.   Facility will start auth today.   Pending PT note and insurance auth.   LCSW will continue to follow.   Latasha Guzman

## 2018-04-26 NOTE — Evaluation (Signed)
Physical Therapy Evaluation Patient Details Name: Latasha Guzman MRN: [id number] DOB: [birth year] Today's Date: 04/26/2018   History of Present Illness  73 y.o. female with history of atrial fibrillation on Xarelto, diastolic CHF, diabetes mellitus who was admitted last month for GI bleed and also in September 2019 for GI bleed at that time patient had undergone EGD and colonoscopy which showed hemorrhoids and diverticulosis subsequent which patient also had capsule endoscopy which was unremarkable was brought to the ER after patient had 3 episodes of frank rectal bleed at her living facility.  Clinical Impression  Pt rolls with MAX A, but does give forth good effort with reaching and use of bed rail.  She is able to A in holding herself on her side while dressing change was being performed.  Recommend returning to SNF with therapy services there.  Pt scheduled to d/c and will sign off.  If pt's d/c is canceled, please re-order PT.    Follow Up Recommendations SNF    Equipment Recommendations  None recommended by PT    Recommendations for Other Services       Precautions / Restrictions Precautions Precautions: Other (comment) Precaution Comments: sacral wound      Mobility  Bed Mobility Overal bed mobility: Needs Assistance Bed Mobility: Rolling Rolling: Max assist         General bed mobility comments: Pt rolled side to side for dressing change and re-positioning.  It took encouragement to get her to participate due to anxiety regarding dressing change that nurse performed while in her side.  Transfers                    Ambulation/Gait                Stairs            Wheelchair Mobility    Modified Rankin (Stroke Patients Only)       Balance                                             Pertinent Vitals/Pain Pain Assessment: Faces Faces Pain Scale: Hurts even more Pain Location: with dressing change Pain Descriptors /  Indicators: Grimacing Pain Intervention(s): Premedicated before session;Monitored during session;Repositioned    Home Living Family/patient expects to be discharged to:: Skilled nursing facility                      Prior Function Level of [place]: Needs assistance   Gait / Transfers Assistance Needed: Pt has been getting PT at SNF, but reports she has been unable to stand.  She has not gotten to a w/c at SNF due to sacral wound.  She states she hasn't really walked in 6 months.           Hand Dominance   Dominant Hand: Right    Extremity/Trunk Assessment   Upper Extremity Assessment Upper Extremity Assessment: Generalized weakness    Lower Extremity Assessment Lower Extremity Assessment: Generalized weakness;LLE deficits/detail;RLE deficits/detail RLE Deficits / Details: limited ROM LLE Deficits / Details: limited ROM       Communication   Communication: No difficulties  Cognition Arousal/Alertness: Awake/alert Behavior During Therapy: Anxious Overall Cognitive Status: No family/caregiver present to determine baseline cognitive functioning Area of Impairment: Memory;Orientation;Safety/judgement;Awareness  Orientation Level: Disoriented to;Time;Person   Memory: Decreased short-term memory   Safety/Judgement: Decreased awareness of safety;Decreased awareness of deficits Awareness: Intellectual   General Comments: knows she is in a hospital, but thinks she is in [place].  Couldn't remember how to say the year and stated 13,000.      General Comments      Exercises     Assessment/Plan    PT Assessment All further PT needs can be met in the next venue of care  PT Problem List Decreased range of motion;Decreased strength;Decreased activity tolerance;Pain;Decreased mobility       PT Treatment Interventions      PT Goals (Current goals can be found in the Care Plan section)  Acute Rehab PT Goals Patient Stated Goal:  return to [facility] PT Goal Formulation: All assessment and education complete, DC therapy    Frequency     Barriers to discharge        Co-evaluation               AM-PAC PT "6 Clicks" Mobility  Outcome Measure Help needed turning from your back to your side while in a flat bed without using bedrails?: Total Help needed moving from lying on your back to sitting on the side of a flat bed without using bedrails?: Total Help needed moving to and from a bed to a chair (including a wheelchair)?: Total Help needed standing up from a chair using your arms (e.g., wheelchair or bedside chair)?: Total Help needed to walk in hospital room?: Total Help needed climbing 3-5 steps with a railing? : Total 6 Click Score: 6    End of Session   Activity Tolerance: Patient limited by pain Patient left: in bed Nurse Communication: Mobility status PT Visit Diagnosis: Muscle weakness (generalized) (M62.81)    Time: 1150(no charge for time waiting on nurse to come do dressing change)-1245 PT Time Calculation (min) (ACUTE ONLY): 55 min   Charges:   PT Evaluation $PT Eval Low Complexity: 1 Low PT Treatments $Therapeutic Activity: 8-22 mins        Santiago Glad L. Tamala Julian, [place] Pager [phone] 04/26/2018   Galen Manila 04/26/2018, 12:58 PM

## 2018-04-26 NOTE — Progress Notes (Signed)
Pt refused dressing change. Pt also refused at times to be turned and did not want a bath at night.

## 2018-04-26 NOTE — Progress Notes (Signed)
PROGRESS NOTE    [name]  QPY:[id number] DOB: [birth year] DOA: 04/23/2018 PCP: Nicoletta Dress, MD   Brief Narrative: Latasha Guzman is a 73 y.o. femalewithhistory of atrial fibrillation on Xarelto, diastolic CHF, diabetes mellitus. She presented secondary to GI bleeding. She has been treated conservatively.   Assessment & Plan:   Principal Problem:   Acute GI bleeding Active Problems:   Essential hypertension   Chronic diastolic CHF (congestive heart failure) (HCC)   Symptomatic anemia   Ascites   Cirrhosis of liver with ascites (HCC)   Rectal bleeding   Grade III hemorrhoids   Chronic anticoagulation   Hematochezia Patient evaluated by GI. Patient with a previous workup from 2019. Concern for hemorrhoidal vs stercoral ulcer vs diverticular etiology. Symptoms resolved. Patient received 2 units of PRBC without recurrent bleeding. Follow CBC as an outpatient. GI follow-up outpatient with recommendations to keep bowels soft and regular (avoid constipation). Hemoglobin improved from 7.7 to 9.2 s/p blood transfusion.  Atrial fibrillation Patient is on Xarelto as an outpatient which was held secondary to GI bleeding. Also on Cardizem and metoprolol which were continued. Xarelto okay to restart in 5 days (Sunday, March 8th) per GI recommendations.  Chronic diastolic heart failure Patient is on lasix as an outpatient. Held secondary to blood pressure intolerance. Will likely benefit from a reduced dose on discharge.  Cirrhosis Patient with associated ascites. Patient underwent paracentesis on 3/3 yielding 2.7 L of yellow fluid. No evidence of SBP on lab work. Fluid culture no growth to date. Cytology pending. Cell count unremarkable.  CKD stage III Stable. Baseline creatine 1.1-1.3.  Anemia Chronic. Likely in setting of chronic disease vs chronic blood loss vs combination. No ferritin obtained, however. Patient already received blood.  Acute urinary retention Patient  with bladder scan significant for >800 ml retained. Foley catheter placed. Patient will need outpatient urology follow-up.  Pressure injury Left hip, POA  DVT prophylaxis: SCDs Code Status:   Code Status: Full Code Family Communication: None at bedside Disposition Plan: Medically stable for discharge. Pending bed placement   Consultants:   [facility]  Procedures:   None  Antimicrobials:  None    Subjective: No bloody stools. No concerns overnight.  Objective: Vitals:   04/26/18 0600 04/26/18 0700 04/26/18 0800 04/26/18 1200  BP: (!) 96/52 109/64    Pulse: 99 97    Resp:  15    Temp:   98.4 F (36.9 C) 98 F (36.7 C)  TempSrc:   Oral Oral  SpO2: 97% 97%    Weight:      Height:        Intake/Output Summary (Last 24 hours) at 04/26/2018 1501 Last data filed at 04/26/2018 0700 Gross per 24 hour  Intake -  Output 625 ml  Net -625 ml   Filed Weights   04/23/18 1644 04/23/18 1732 04/24/18 0220  Weight: 102.1 kg 102.1 kg 73.7 kg    Examination:  General exam: Appears calm and comfortable Respiratory system: Clear to auscultation. Respiratory effort normal. Cardiovascular system: S1 & S2 heard, Irregular rhythm, normal rate. No murmurs, rubs, gallops or clicks. Gastrointestinal system: Abdomen is nondistended, soft and nontender. No organomegaly or masses felt. Normal bowel sounds heard. Central nervous system: Alert. No focal neurological deficits. Extremities: No edema. No calf tenderness Skin: No cyanosis. No rashes Psychiatry: Judgement and insight appear normal. Mood & affect appropriate.     Data Reviewed: I have personally reviewed following labs and imaging studies  CBC: Recent  Labs  Lab 04/23/18 1703 04/24/18 0541 04/25/18 0335  WBC 12.3* 8.9 7.0  NEUTROABS 9.2*  --   --   HGB 7.7* 7.7* 9.2*  HCT 25.6* 25.0* 29.7*  MCV 95.2 94.7 91.1  PLT 495* 243 419   Basic Metabolic Panel: Recent Labs  Lab 04/23/18 1703 04/24/18 0541 04/25/18 0335    NA 134* 135 133*  K 4.1 3.2* 3.9  CL 101 103 104  CO2 25 26 22   GLUCOSE 94 76 75  BUN 37* 39* 38*  CREATININE 1.31* 1.26* 1.31*  CALCIUM 8.5* 7.9* 8.2*   GFR: Estimated Creatinine Clearance: 39.2 mL/min (A) (by C-G formula based on SCr of 1.31 mg/dL (H)). Liver Function Tests: Recent Labs  Lab 04/23/18 1703 04/24/18 0541 04/25/18 0335  AST 69* 64* 66*  ALT 15 15 17   ALKPHOS 93 75 80  BILITOT 2.4* 2.3* 3.0*  PROT 5.4* 4.7* 5.1*  ALBUMIN 1.9* 1.8* 1.9*   Recent Labs  Lab 04/23/18 1703  LIPASE 39   No results for input(s): AMMONIA in the last 168 hours. Coagulation Profile: Recent Labs  Lab 04/23/18 1703  INR 2.1*   Cardiac Enzymes: No results for input(s): CKTOTAL, CKMB, CKMBINDEX, TROPONINI in the last 168 hours. BNP (last 3 results) No results for input(s): PROBNP in the last 8760 hours. HbA1C: No results for input(s): HGBA1C in the last 72 hours. CBG: Recent Labs  Lab 04/25/18 2024 04/25/18 2323 04/26/18 0318 04/26/18 0826 04/26/18 1303  GLUCAP 90 77 74 79 107*   Lipid Profile: No results for input(s): CHOL, HDL, LDLCALC, TRIG, CHOLHDL, LDLDIRECT in the last 72 hours. Thyroid Function Tests: No results for input(s): TSH, T4TOTAL, FREET4, T3FREE, THYROIDAB in the last 72 hours. Anemia Panel: Recent Labs    04/25/18 0814  TIBC 141*  IRON 48   Sepsis Labs: No results for input(s): PROCALCITON, LATICACIDVEN in the last 168 hours.  Recent Results (from the past 240 hour(s))  MRSA PCR Screening     Status: None   Collection Time: 04/24/18  2:38 AM  Result Value Ref Range Status   MRSA by PCR NEGATIVE NEGATIVE Final    Comment:        The GeneXpert MRSA Assay (FDA approved for NASAL specimens only), is one component of a comprehensive MRSA colonization surveillance program. It is not intended to diagnose MRSA infection nor to guide or monitor treatment for MRSA infections. Performed at [facility], [place] [address],  [place], [place] [address]   Culture, Urine     Status: Abnormal   Collection Time: 04/24/18  6:13 PM  Result Value Ref Range Status   Specimen Description   Final    URINE, CLEAN CATCH Performed at [facility], [place] [address] [place], [place] [address]    Special Requests   Final    NONE Performed at [facility], [place] [address], [place], [place] [address]    Culture MULTIPLE SPECIES PRESENT, SUGGEST RECOLLECTION (A)  Final   Report Status 04/26/2018 FINAL  Final  Culture, body fluid-bottle     Status: None (Preliminary result)   Collection Time: 04/25/18 10:48 AM  Result Value Ref Range Status   Specimen Description PERITONEAL  Final   Special Requests NONE  Final   Culture   Final    NO GROWTH 1 DAY Performed at [facility], [place] [address] [place], [place] [address]    Report Status PENDING  Incomplete  Gram stain  Status: None   Collection Time: 04/25/18 10:48 AM  Result Value Ref Range Status   Specimen Description PERITONEAL  Final   Special Requests NONE  Final   Gram Stain   Final    FEW WBC PRESENT, PREDOMINANTLY MONONUCLEAR NO ORGANISMS SEEN Performed at [facility], [address], [place], [place] [address]    Report Status 04/25/2018 FINAL  Final         Radiology Studies: US Paracentesis  Result Date: 04/25/2018 INDICATION: Patient with history of cirrhosis, chronic kidney disease, CHF, recurrent ascites. Request made for diagnostic and therapeutic paracentesis up to 4 liters. EXAM: ULTRASOUND GUIDED DIAGNOSTIC AND THERAPEUTIC PARACENTESIS MEDICATIONS: None COMPLICATIONS: None immediate. PROCEDURE: Informed written consent was obtained from the patient after a discussion of the risks, benefits and alternatives to treatment. A timeout was performed prior to the initiation of the procedure. Initial ultrasound scanning demonstrates a small to moderate amount of ascites within the right mid abdominal quadrant.  The right mid abdomen was prepped and draped in the usual sterile fashion. 1% lidocaine was used for local anesthesia. Following this, a 19 gauge, 10-cm, Yueh catheter was introduced. An ultrasound image was saved for documentation purposes. The paracentesis was performed. The catheter was removed and a dressing was applied. The patient tolerated the procedure well without immediate post procedural complication. FINDINGS: A total of approximately 2.7 liters of yellow fluid was removed. Samples were sent to the laboratory as requested by the clinical team. IMPRESSION: Successful ultrasound-guided diagnostic and therapeutic paracentesis yielding 2.7 liters of peritoneal fluid. Read by: Rowe Robert, PA-C Electronically Signed   By: Marybelle Killings M.D.   On: 04/25/2018 11:22        Scheduled Meds: . Chlorhexidine Gluconate Cloth  6 each Topical Daily  . diltiazem  60 mg Oral Q6H  . hydrocortisone   Rectal BID  . mouth rinse  15 mL Mouth Rinse BID  . metoprolol tartrate  12.5 mg Oral BID  . pantoprazole  40 mg Oral Daily  . polyethylene glycol  17 g Oral BID  . traZODone  25 mg Oral QHS   Continuous Infusions: . sodium chloride       LOS: 3 days     Cordelia Poche, MD [facility] 04/26/2018, 3:01 PM  If 7PM-7AM, please contact night-coverage [email or web address]

## 2018-04-27 LAB — GLUCOSE, CAPILLARY
GLUCOSE-CAPILLARY: 81 mg/dL (ref 70–99)
Glucose-Capillary: 106 mg/dL — ABNORMAL HIGH (ref 70–99)
Glucose-Capillary: 83 mg/dL (ref 70–99)
Glucose-Capillary: 83 mg/dL (ref 70–99)
Glucose-Capillary: 86 mg/dL (ref 70–99)
Glucose-Capillary: 92 mg/dL (ref 70–99)
Glucose-Capillary: 94 mg/dL (ref 70–99)

## 2018-04-27 NOTE — Progress Notes (Signed)
   04/27/18 0508  Vitals  Temp 99.2 F (37.3 C)  Temp Source Oral  BP 97/67  MAP (mmHg) 77  BP Location Right Arm  BP Method Automatic  Patient Position (if appropriate) Lying  Pulse Rate (!) 108

## 2018-04-27 NOTE — Progress Notes (Signed)
PROGRESS NOTE    Latasha Guzman  WEX:[id number] DOB: 1945-09-19 DOA: 04/23/2018 PCP: Nicoletta Dress, MD   Brief Narrative: Latasha Guzman is a 73 y.o. femalewithhistory of atrial fibrillation on Xarelto, diastolic CHF, diabetes mellitus. She presented secondary to GI bleeding. She has been treated conservatively.   Assessment & Plan:   Principal Problem:   Acute GI bleeding Active Problems:   Essential hypertension   Chronic diastolic CHF (congestive heart failure) (HCC)   Symptomatic anemia   Ascites   Cirrhosis of liver with ascites (HCC)   Rectal bleeding   Grade III hemorrhoids   Chronic anticoagulation   Hematochezia Patient evaluated by GI. Patient with a previous workup from 2019. Concern for hemorrhoidal vs stercoral ulcer vs diverticular etiology. Symptoms resolved. Patient received 2 units of PRBC without recurrent bleeding. Follow CBC as an outpatient. GI follow-up outpatient with recommendations to keep bowels soft and regular (avoid constipation). Hemoglobin improved from 7.7 to 9.2 s/p blood transfusion. Repeat CBC in 1 week, sooner if recurrent bleeding.  Atrial fibrillation Patient is on Xarelto as an outpatient which was held secondary to GI bleeding. Also on Cardizem and metoprolol which were continued. Xarelto okay to restart in 5 days (Sunday, March 8th) per GI recommendations.  Chronic diastolic heart failure Patient is on lasix as an outpatient. Held secondary to blood pressure intolerance. Will likely benefit from a reduced dose on discharge.  Cirrhosis Patient with associated ascites. Patient underwent paracentesis on 3/3 yielding 2.7 L of yellow fluid. No evidence of SBP on lab work. Fluid culture no growth to date. Cytology pending. Cell count unremarkable.  CKD stage III Stable. Baseline creatine 1.1-1.3.  Anemia Chronic. Likely in setting of chronic disease vs chronic blood loss vs combination. No ferritin obtained, however. Patient already  received blood.  Acute urinary retention Patient with bladder scan significant for >800 ml retained. Foley catheter placed. Patient will need outpatient urology follow-up.  Pressure injury Left hip, POA  S/p pacemaker  DVT prophylaxis: SCDs Code Status:   Code Status: Full Code Family Communication: None at bedside Disposition Plan: Medically stable for discharge. Pending bed placement   Consultants:   [facility]  Procedures:   None  Antimicrobials:  None    Subjective: No bloody stools. Was confused about time of day since being moved from the ICU/stepdown unit.  Objective: Vitals:   04/26/18 2126 04/27/18 0026 04/27/18 0508 04/27/18 1127  BP: 98/67 94/65 97/67  107/70  Pulse: 96 99 (!) 108 (!) 107  Resp: 16  16   Temp: 97.8 F (36.6 C)  99.2 F (37.3 C)   TempSrc: Oral  Oral   SpO2: 99%  97% 97%  Weight:      Height:        Intake/Output Summary (Last 24 hours) at 04/27/2018 1242 Last data filed at 04/27/2018 0940 Gross per 24 hour  Intake 1145.38 ml  Output 575 ml  Net 570.38 ml   Filed Weights   04/23/18 1644 04/23/18 1732 04/24/18 0220  Weight: 102.1 kg 102.1 kg 73.7 kg    Examination:  General exam: Appears calm and comfortable Respiratory system: Clear to auscultation. Respiratory effort normal. Cardiovascular system: S1 & S2 heard, irregular rhythm with normal rate. No murmurs, rubs, gallops or clicks. Gastrointestinal system: Abdomen is nondistended, soft and nontender. No organomegaly or masses felt. Normal bowel sounds heard. Central nervous system: Alert and oriented x4. No focal neurological deficits. Extremities: No edema. No calf tenderness Skin: No cyanosis. No rashes  Psychiatry: Judgement and insight appear normal. Mood & affect appropriate.      Data Reviewed: I have personally reviewed following labs and imaging studies  CBC: Recent Labs  Lab 04/23/18 1703 04/24/18 0541 04/25/18 0335  WBC 12.3* 8.9 7.0  NEUTROABS 9.2*  --    --   HGB 7.7* 7.7* 9.2*  HCT 25.6* 25.0* 29.7*  MCV 95.2 94.7 91.1  PLT 495* 243 865   Basic Metabolic Panel: Recent Labs  Lab 04/23/18 1703 04/24/18 0541 04/25/18 0335  NA 134* 135 133*  K 4.1 3.2* 3.9  CL 101 103 104  CO2 25 26 22   GLUCOSE 94 76 75  BUN 37* 39* 38*  CREATININE 1.31* 1.26* 1.31*  CALCIUM 8.5* 7.9* 8.2*   GFR: Estimated Creatinine Clearance: 39.2 mL/min (A) (by C-G formula based on SCr of 1.31 mg/dL (H)). Liver Function Tests: Recent Labs  Lab 04/23/18 1703 04/24/18 0541 04/25/18 0335  AST 69* 64* 66*  ALT 15 15 17   ALKPHOS 93 75 80  BILITOT 2.4* 2.3* 3.0*  PROT 5.4* 4.7* 5.1*  ALBUMIN 1.9* 1.8* 1.9*   Recent Labs  Lab 04/23/18 1703  LIPASE 39   No results for input(s): AMMONIA in the last 168 hours. Coagulation Profile: Recent Labs  Lab 04/23/18 1703  INR 2.1*   Cardiac Enzymes: No results for input(s): CKTOTAL, CKMB, CKMBINDEX, TROPONINI in the last 168 hours. BNP (last 3 results) No results for input(s): PROBNP in the last 8760 hours. HbA1C: No results for input(s): HGBA1C in the last 72 hours. CBG: Recent Labs  Lab 04/26/18 1906 04/27/18 0007 04/27/18 0418 04/27/18 0717 04/27/18 1123  GLUCAP 94 94 92 81 86   Lipid Profile: No results for input(s): CHOL, HDL, LDLCALC, TRIG, CHOLHDL, LDLDIRECT in the last 72 hours. Thyroid Function Tests: No results for input(s): TSH, T4TOTAL, FREET4, T3FREE, THYROIDAB in the last 72 hours. Anemia Panel: Recent Labs    04/25/18 0814  TIBC 141*  IRON 48   Sepsis Labs: No results for input(s): PROCALCITON, LATICACIDVEN in the last 168 hours.  Recent Results (from the past 240 hour(s))  MRSA PCR Screening     Status: None   Collection Time: 04/24/18  2:38 AM  Result Value Ref Range Status   MRSA by PCR NEGATIVE NEGATIVE Final    Comment:        The GeneXpert MRSA Assay (FDA approved for NASAL specimens only), is one component of a comprehensive MRSA colonization surveillance  program. It is not intended to diagnose MRSA infection nor to guide or monitor treatment for MRSA infections. Performed at [facility], [place] [address], [place], [place] [address]   Culture, Urine     Status: Abnormal   Collection Time: 04/24/18  6:13 PM  Result Value Ref Range Status   Specimen Description   Final    URINE, CLEAN CATCH Performed at [facility], [place] [address], [place], [place] [address]    Special Requests   Final    NONE Performed at [facility], [place] [address] [place], [place] [address]    Culture MULTIPLE SPECIES PRESENT, SUGGEST RECOLLECTION (A)  Final   Report Status 04/26/2018 FINAL  Final  Culture, body fluid-bottle     Status: None (Preliminary result)   Collection Time: 04/25/18 10:48 AM  Result Value Ref Range Status   Specimen Description PERITONEAL  Final   Special Requests NONE  Final   Culture   Final    NO  GROWTH 1 DAY Performed at [facility], [place] [address], [place], [place] [address]    Report Status PENDING  Incomplete  Gram stain     Status: None   Collection Time: 04/25/18 10:48 AM  Result Value Ref Range Status   Specimen Description PERITONEAL  Final   Special Requests NONE  Final   Gram Stain   Final    FEW WBC PRESENT, PREDOMINANTLY MONONUCLEAR NO ORGANISMS SEEN Performed at [facility], [address], [place], [place] [address]    Report Status 04/25/2018 FINAL  Final         Radiology Studies: No results found.      Scheduled Meds: . Chlorhexidine Gluconate Cloth  6 each Topical Daily  . diltiazem  60 mg Oral Q6H  . hydrocortisone   Rectal BID  . mouth rinse  15 mL Mouth Rinse BID  . metoprolol tartrate  12.5 mg Oral BID  . pantoprazole  40 mg Oral Daily  . polyethylene glycol  17 g Oral BID  . traZODone  25 mg Oral QHS   Continuous Infusions: . sodium chloride 10 mL/hr at 04/26/18 1709     LOS: 4 days     Cordelia Poche,  MD [facility] 04/27/2018, 12:42 PM  If 7PM-7AM, please contact night-coverage [email or web address]

## 2018-04-27 NOTE — Progress Notes (Signed)
Clinical Social Worker following patient for discharge needs. Patient at this time is awaiting authorization through insurance. Per facility they started auth but has not yet received approval from [facility]. Facility stated they will let CSW know when authorization has been approved.   Latasha Guzman, MSW,  [place]

## 2018-04-27 NOTE — Progress Notes (Signed)
Pt refused to let me do her dressing changes today. I tried twice and she refused me both times.  She is very emotional.

## 2018-04-27 NOTE — Progress Notes (Addendum)
Patient refused dressing change at this time, witnessed by [place], [place]. Will ask patient again.

## 2018-04-27 NOTE — Care Management Important Message (Signed)
Important Message  Patient Details  Name: Latasha Guzman MRN: [id number] Date of Birth: [birth year]   Medicare Important Message Given:       Latasha Guzman 04/27/2018, 9:27 AM

## 2018-04-27 NOTE — Progress Notes (Signed)
Patient currently has MEWS score of 2, Pt has Afib and usually has a baseline pulse that fluctuates from 90s to high 120s. BP currently 97/67 and takes scheduled CARDIZEM 60 mg Q6.

## 2018-04-28 DIAGNOSIS — K642 Third degree hemorrhoids: Principal | ICD-10-CM

## 2018-04-28 LAB — GLUCOSE, CAPILLARY
GLUCOSE-CAPILLARY: 79 mg/dL (ref 70–99)
Glucose-Capillary: 78 mg/dL (ref 70–99)

## 2018-04-28 MED ORDER — RIVAROXABAN 20 MG PO TABS: ORAL_TABLET | ORAL | 0 refills | Status: AC

## 2018-04-28 MED ORDER — TRAMADOL HCL 50 MG PO TABS: 50.0000 mg | ORAL_TABLET | Freq: Three times a day (TID) | ORAL | 0 refills | Status: AC | PRN

## 2018-04-28 NOTE — Progress Notes (Signed)
RN asked patient if dressing change could be performed at this time, she refused, RN explained to patient that performing wound care as ordered can cause infection at the wound site. Will continue to monitor.

## 2018-04-28 NOTE — Discharge Summary (Signed)
Physician Discharge Summary  Latasha Guzman:[id number] DOB: 15-Oct-1945 DOA: 04/23/2018  PCP: Latasha Dress, MD  Admit date: 04/23/2018 Discharge date: 04/28/2018  Admitted From: SNF Disposition: SNF  Recommendations for Outpatient Follow-up:  1. Follow up with PCP in 1 week 2. Restart Xarelto on 04/30/2018 3. Patient needs to have soft bowel movements daily; avoid constipation 4. Please obtain BMP/CBC in one week 5. Please follow up with Dr. Lyda Guzman with GI in 2 to 4 weeks 6. Please follow up on the following pending results: Ascites fluid culture (final result)   Discharge Condition: Stable CODE STATUS: Full code Diet recommendation: Heart healthy   Brief/Interim Summary:  Admission HPI written by Rise Patience, MD   Chief Complaint: Rectal bleeding.  HPI: Latasha Guzman is a 73 y.o. female with history of atrial fibrillation on Xarelto, diastolic CHF, diabetes mellitus who was admitted last month for GI bleed and also in September 2019 for GI bleed at that time patient had undergone EGD and colonoscopy which showed hemorrhoids and diverticulosis subsequent which patient also had capsule endoscopy which was unremarkable was brought to the ER after patient had 3 episodes of frank rectal bleed at her living facility.  Patient denies any abdominal pain nausea or vomiting.  During recent admission patient also had paracentesis and on sonogram of the abdomen showed features concerning for cirrhosis of the liver.  ED Course: In the ER patient was mildly hypotensive was given fluids following which blood pressure improved.  Hemoglobin is dropped about 2 g from recent 9 g.  And since patient is on Xarelto was given Wandalee Ferdinand for reversal.  On-call legal gastroenterologist was consulted by ER physician and patient ordered 1 unit of PRBC transfusion.  Admitted for further management.    Hospital course:  Hematochezia Patient evaluated by GI. Patient with a previous  workup from 2019. Concern for hemorrhoidal vs stercoral ulcer vs diverticular etiology. Symptoms resolved. Patient received 2 units of PRBC without recurrent bleeding. Follow CBC as an outpatient. GI follow-up outpatient with recommendations to keep bowels soft and regular (avoid constipation). Hemoglobin improved from 7.7 to 9.2 s/p blood transfusion. Repeat CBC in 1 week, sooner if recurrent bleeding.  Atrial fibrillation Patient is on Xarelto as an outpatient which was held secondary to GI bleeding. Also on Cardizem and metoprolol which were continued. Xarelto okay to restart in 5 days (Sunday, March 8th) per GI recommendations.  Chronic diastolic heart failure Patient is on lasix as an outpatient. Held secondary to blood pressure intolerance. Will likely benefit from a reduced dose on discharge. Lasix reduced to 20 mg dose. Follow daily weights.  Cirrhosis Patient with associated ascites. Patient underwent paracentesis on 3/3 yielding 2.7 L of yellow fluid. No evidence of SBP on lab work. Fluid culture no growth to date. Cytology significant for peritoneal fluid. Cell count unremarkable.  CKD stage III Stable. Baseline creatine 1.1-1.3.  Anemia Chronic. Likely in setting of chronic disease vs chronic blood loss vs combination. No ferritin obtained, however. Patient already received blood.  Acute urinary retention Patient with bladder scan significant for >800 ml retained. Foley catheter placed. Patient will need outpatient urology follow-up.  Pressure injury Left hip, mid/right sacrum POA. Wound care to sacral Stage 4 and left hip full thickness wounds: Cleanse with NS, pat gently dry. Fill sacral defect with saline moistened roll gauze (Kerlix), top with dry gauze, ABD and secure with paper tape. Top left hip full thickness wound with saline moistened gauze 2x2,  top with dry gauze 2x2, 4x4s and secure with 2-inch paper tape. Turn patient side to side and minimize time in supine  position.  S/p pacemaker  Discharge Diagnoses:  Principal Problem:   Acute GI bleeding Active Problems:   Essential hypertension   Chronic diastolic CHF (congestive heart failure) (HCC)   Symptomatic anemia   Ascites   Cirrhosis of liver with ascites (HCC)   Rectal bleeding   Grade III hemorrhoids   Chronic anticoagulation    Discharge Instructions  Discharge Instructions    Call MD for:  difficulty breathing, headache or visual disturbances   Complete by:  As directed    Call MD for:  redness, tenderness, or signs of infection (pain, swelling, redness, odor or green/yellow discharge around incision site)   Complete by:  As directed    Call MD for:  severe uncontrolled pain   Complete by:  As directed    Call MD for:  temperature >100.4   Complete by:  As directed    Diet - low sodium heart healthy   Complete by:  As directed    Increase activity slowly   Complete by:  As directed    Increase activity slowly   Complete by:  As directed      Allergies as of 04/28/2018      Reactions   Iodine Hives, Other (See Comments), Shortness Of Breath   Throat swells   Iodinated Diagnostic Agents    Penicillins Hives   Has patient had a PCN reaction causing immediate rash, facial/tongue/throat swelling, SOB or lightheadedness with hypotension: Yes Has patient had a PCN reaction causing severe rash involving mucus membranes or skin necrosis: No Has patient had a PCN reaction that required hospitalization: No Has patient had a PCN reaction occurring within the last 10 years: No If all of the above answers are "NO", then may proceed with Cephalosporin use.      Medication List    STOP taking these medications   collagenase ointment Commonly known as:  SANTYL   lip balm ointment     TAKE these medications   atorvastatin 80 MG tablet Commonly known as:  LIPITOR Take 80 mg by mouth at bedtime.   cholecalciferol 25 MCG (1000 UT) tablet Commonly known as:  VITAMIN D3 Take  1,000 Units by mouth daily.   diltiazem 60 MG tablet Commonly known as:  CARDIZEM Take 1 tablet (60 mg total) by mouth every 6 (six) hours.   docusate sodium 100 MG capsule Commonly known as:  COLACE Take 100 mg by mouth 2 (two) times daily.   eucerin cream Apply 1 application topically daily.   fenofibrate 160 MG tablet Take 160 mg by mouth every morning.   furosemide 40 MG tablet Commonly known as:  LASIX Take 0.5 tablets (20 mg total) by mouth daily. What changed:  how much to take   hydrocortisone 2.5 % rectal cream Commonly known as:  Anusol-HC Apply rectally 2 times daily What changed:    how to take this  when to take this  additional instructions   hydrocortisone 25 MG suppository Commonly known as:  ANUSOL-HC Place 1 suppository (25 mg total) rectally every 12 (twelve) hours.   ipratropium-albuterol 0.5-2.5 (3) MG/3ML Soln Commonly known as:  DUONEB Take 3 mLs by nebulization every 6 (six) hours. Pt also takes as needed What changed:  Another medication with the same name was removed. Continue taking this medication, and follow the directions you see here.   metoprolol tartrate  25 MG tablet Commonly known as:  LOPRESSOR Take 0.5 tablets (12.5 mg total) by mouth 2 (two) times daily.   multivitamin with minerals Tabs tablet Take 1 tablet by mouth daily.   Nutritional Supplement Plus Liqd Take 30 mLs by mouth daily. Med pass   Ensure Plus Liqd Take 237 mLs by mouth 2 (two) times daily between meals.   omeprazole 20 MG capsule Commonly known as:  PRILOSEC Take 20 mg by mouth every evening.   polyethylene glycol packet Commonly known as:  MIRALAX / GLYCOLAX Take 17 g by mouth 2 (two) times daily. What changed:  when to take this   ProAir HFA 108 (90 Base) MCG/ACT inhaler Generic drug:  albuterol Inhale 2 puffs into the lungs every 6 (six) hours as needed for wheezing or shortness of breath.   rivaroxaban 20 MG Tabs tablet Commonly known as:   XARELTO Start taking 1 tablet daily 04/30/2018 Start taking on:  April 30, 2018 What changed:    how much to take  how to take this  when to take this  additional instructions  These instructions start on April 30, 2018. If you are unsure what to do until then, ask your doctor or other care provider.   senna 8.6 MG Tabs tablet Commonly known as:  SENOKOT Take 2 tablets by mouth at bedtime.   simethicone 80 MG chewable tablet Commonly known as:  MYLICON Chew 80 mg by mouth every 6 (six) hours as needed for flatulence.   sodium phosphate Pediatric 3.5-9.5 GM/59ML enema Place 1 enema rectally once as needed for moderate constipation or severe constipation.   traMADol 50 MG tablet Commonly known as:  ULTRAM Take 1 tablet (50 mg total) by mouth every 8 (eight) hours as needed for moderate pain.   traZODone 50 MG tablet Commonly known as:  DESYREL Take 0.5 tablets (25 mg total) by mouth at bedtime as needed for sleep. What changed:  when to take this      Follow-up Information    Latasha Dress, MD Follow up.   Specialty:  Internal Medicine Contact information: [address] [place] [address] [phone]        Park Liter, MD .   Specialty:  Cardiology Contact information: [place] [address] [phone]          Allergies  Allergen Reactions  . Iodine Hives, Other (See Comments) and Shortness Of Breath    Throat swells  . Iodinated Diagnostic Agents   . Penicillins Hives    Has patient had a PCN reaction causing immediate rash, facial/tongue/throat swelling, SOB or lightheadedness with hypotension: Yes Has patient had a PCN reaction causing severe rash involving mucus membranes or skin necrosis: No Has patient had a PCN reaction that required hospitalization: No Has patient had a PCN reaction occurring within the last 10 years: No If all of the above answers are "NO", then may proceed with  Cephalosporin use.     Consultations:  Gastroenterology   Procedures/Studies: Ct Abdomen Pelvis Wo Contrast  Result Date: 04/23/2018 CLINICAL DATA:  Intermittent GI bleeding EXAM: CT ABDOMEN AND PELVIS WITHOUT CONTRAST TECHNIQUE: Multidetector CT imaging of the abdomen and pelvis was performed following the standard protocol without IV contrast. COMPARISON:  None. FINDINGS: Lower chest: Moderate right and small left pleural effusions. Associated bibasilar atelectasis. Cardiomegaly. Small pericardial effusion. Hypodense blood pool relative to myocardium, suggesting anemia. Pacemaker leads, incompletely visualized. Hepatobiliary: Unenhanced liver is notable for a  mildly nodular hepatic contour (series 2/image 24), suggesting cirrhosis. Gallbladder is distended but grossly unremarkable. No intrahepatic or extrahepatic ductal dilatation. Pancreas: Within normal limits. Spleen: Borderline enlargement of the spleen, measuring 14.1 cm in craniocaudal dimension (coronal image 75). Adrenals/Urinary Tract: Adrenal glands are within normal limits. Kidneys are grossly unremarkable.  No hydronephrosis. Bladder is within normal limits. Stomach/Bowel: Stomach is within normal limits. No evidence of bowel obstruction. Normal appendix (series 2/image 54). Mild rectal wall thickening (series 2/image 78). Vascular/Lymphatic: No evidence of abdominal aortic aneurysm. Atherosclerotic calcifications of the abdominal aorta and branch vessels. No suspicious abdominopelvic lymphadenopathy. Reproductive: Status post hysterectomy. No adnexal masses. Other: Moderate abdominopelvic ascites. Musculoskeletal: Moderate to severe compression fracture deformity at T12, with possible pathologic fracture given the lytic/sclerotic appearance. No retropulsion. IMPRESSION: Mild rectal wall thickening, nonspecific. Correlate for infectious/inflammatory prostatitis. Neoplasm is considered unlikely, but endoscopic correlation is suggested.  Cirrhosis with moderate abdominopelvic ascites and borderline splenomegaly. Moderate to severe compression fracture deformity at T12, possibly pathologic. No retropulsion. Moderate right and small left pleural effusions. Electronically Signed   By: Julian Hy M.D.   On: 04/23/2018 17:19   US Paracentesis  Result Date: 04/25/2018 INDICATION: Patient with history of cirrhosis, chronic kidney disease, CHF, recurrent ascites. Request made for diagnostic and therapeutic paracentesis up to 4 liters. EXAM: ULTRASOUND GUIDED DIAGNOSTIC AND THERAPEUTIC PARACENTESIS MEDICATIONS: None COMPLICATIONS: None immediate. PROCEDURE: Informed written consent was obtained from the patient after a discussion of the risks, benefits and alternatives to treatment. A timeout was performed prior to the initiation of the procedure. Initial ultrasound scanning demonstrates a small to moderate amount of ascites within the right mid abdominal quadrant. The right mid abdomen was prepped and draped in the usual sterile fashion. 1% lidocaine was used for local anesthesia. Following this, a 19 gauge, 10-cm, Yueh catheter was introduced. An ultrasound image was saved for documentation purposes. The paracentesis was performed. The catheter was removed and a dressing was applied. The patient tolerated the procedure well without immediate post procedural complication. FINDINGS: A total of approximately 2.7 liters of yellow fluid was removed. Samples were sent to the laboratory as requested by the clinical team. IMPRESSION: Successful ultrasound-guided diagnostic and therapeutic paracentesis yielding 2.7 liters of peritoneal fluid. Read by: Rowe Robert, PA-C Electronically Signed   By: Marybelle Killings M.D.   On: 04/25/2018 11:22       Subjective: Some pain with dressing change. No other concerns.  Discharge Exam: Vitals:   04/27/18 2130 04/28/18 0555  BP: 103/64 102/70  Pulse: (!) 107 (!) 104  Resp: 17 16  Temp: 98.9 F (37.2 C)  99.8 F (37.7 C)  SpO2: 97% 96%   Vitals:   04/27/18 1407 04/27/18 1545 04/27/18 2130 04/28/18 0555  BP: 109/75  103/64 102/70  Pulse: (!) 102 (!) 119 (!) 107 (!) 104  Resp: 18  17 16   Temp: 99.6 F (37.6 C) 99.5 F (37.5 C) 98.9 F (37.2 C) 99.8 F (37.7 C)  TempSrc: Oral Oral Oral Oral  SpO2: 97% 99% 97% 96%  Weight:      Height:        General: Pt is alert, awake, not in acute distress Cardiovascular: Irregular rhythm, normal rate, S1/S2 +, no rubs, no gallops Respiratory: CTA bilaterally, no wheezing, no rhonchi Abdominal: Soft, NT, ND, bowel sounds + Extremities: no edema, no cyanosis    The results of significant diagnostics from this hospitalization (including imaging, microbiology, ancillary and laboratory) are listed below for reference.  Microbiology: Recent Results (from the past 240 hour(s))  MRSA PCR Screening     Status: None   Collection Time: 04/24/18  2:38 AM  Result Value Ref Range Status   MRSA by PCR NEGATIVE NEGATIVE Final    Comment:        The GeneXpert MRSA Assay (FDA approved for NASAL specimens only), is one component of a comprehensive MRSA colonization surveillance program. It is not intended to diagnose MRSA infection nor to guide or monitor treatment for MRSA infections. Performed at [facility], [place] [address], [place], [place] [address]   Culture, Urine     Status: Abnormal   Collection Time: 04/24/18  6:13 PM  Result Value Ref Range Status   Specimen Description   Final    URINE, CLEAN CATCH Performed at [facility], [place] [address], [place], [place] [address]    Special Requests   Final    NONE Performed at [facility], [place] [address], [place], [place] [address]    Culture MULTIPLE SPECIES PRESENT, SUGGEST RECOLLECTION (A)  Final   Report Status 04/26/2018 FINAL  Final  Culture, body fluid-bottle     Status: None (Preliminary result)   Collection Time:  04/25/18 10:48 AM  Result Value Ref Range Status   Specimen Description PERITONEAL  Final   Special Requests NONE  Final   Culture   Final    NO GROWTH 2 DAYS Performed at [facility], [place] [address], [place], [place] [address]    Report Status PENDING  Incomplete  Gram stain     Status: None   Collection Time: 04/25/18 10:48 AM  Result Value Ref Range Status   Specimen Description PERITONEAL  Final   Special Requests NONE  Final   Gram Stain   Final    FEW WBC PRESENT, PREDOMINANTLY MONONUCLEAR NO ORGANISMS SEEN Performed at [facility], [address] [place], [place] [address]    Report Status 04/25/2018 FINAL  Final     Labs: BNP (last 3 results) Recent Labs    12/27/17 1258 03/04/18 0844  BNP 540.2* 644.0*   Basic Metabolic Panel: Recent Labs  Lab 04/23/18 1703 04/24/18 0541 04/25/18 0335  NA 134* 135 133*  K 4.1 3.2* 3.9  CL 101 103 104  CO2 25 26 22   GLUCOSE 94 76 75  BUN 37* 39* 38*  CREATININE 1.31* 1.26* 1.31*  CALCIUM 8.5* 7.9* 8.2*   Liver Function Tests: Recent Labs  Lab 04/23/18 1703 04/24/18 0541 04/25/18 0335  AST 69* 64* 66*  ALT 15 15 17   ALKPHOS 93 75 80  BILITOT 2.4* 2.3* 3.0*  PROT 5.4* 4.7* 5.1*  ALBUMIN 1.9* 1.8* 1.9*   Recent Labs  Lab 04/23/18 1703  LIPASE 39   No results for input(s): AMMONIA in the last 168 hours. CBC: Recent Labs  Lab 04/23/18 1703 04/24/18 0541 04/25/18 0335  WBC 12.3* 8.9 7.0  NEUTROABS 9.2*  --   --   HGB 7.7* 7.7* 9.2*  HCT 25.6* 25.0* 29.7*  MCV 95.2 94.7 91.1  PLT 495* 243 255   Cardiac Enzymes: No results for input(s): CKTOTAL, CKMB, CKMBINDEX, TROPONINI in the last 168 hours. BNP: Invalid input(s): POCBNP CBG: Recent Labs  Lab 04/27/18 1541 04/27/18 1947 04/27/18 2357 04/28/18 0413 04/28/18 0747  GLUCAP 83 83 106* 78 79   D-Dimer No results for input(s): DDIMER in the last 72 hours. Hgb A1c No results for input(s): HGBA1C in  the last 72 hours. Lipid  Profile No results for input(s): CHOL, HDL, LDLCALC, TRIG, CHOLHDL, LDLDIRECT in the last 72 hours. Thyroid function studies No results for input(s): TSH, T4TOTAL, T3FREE, THYROIDAB in the last 72 hours.  Invalid input(s): FREET3 Anemia work up No results for input(s): VITAMINB12, FOLATE, FERRITIN, TIBC, IRON, RETICCTPCT in the last 72 hours. Urinalysis    Component Value Date/Time   COLORURINE AMBER (A) 04/26/2018 0738   APPEARANCEUR CLOUDY (A) 04/26/2018 0738   LABSPEC 1.014 04/26/2018 0738   PHURINE 5.0 04/26/2018 0738   GLUCOSEU NEGATIVE 04/26/2018 0738   HGBUR LARGE (A) 04/26/2018 0738   BILIRUBINUR NEGATIVE 04/26/2018 0738   KETONESUR NEGATIVE 04/26/2018 0738   PROTEINUR NEGATIVE 04/26/2018 0738   NITRITE NEGATIVE 04/26/2018 0738   LEUKOCYTESUR LARGE (A) 04/26/2018 0738   Sepsis Labs Invalid input(s): PROCALCITONIN,  WBC,  LACTICIDVEN Microbiology Recent Results (from the past 240 hour(s))  MRSA PCR Screening     Status: None   Collection Time: 04/24/18  2:38 AM  Result Value Ref Range Status   MRSA by PCR NEGATIVE NEGATIVE Final    Comment:        The GeneXpert MRSA Assay (FDA approved for NASAL specimens only), is one component of a comprehensive MRSA colonization surveillance program. It is not intended to diagnose MRSA infection nor to guide or monitor treatment for MRSA infections. Performed at [facility], [place] [address] [place], [place] [address]   Culture, Urine     Status: Abnormal   Collection Time: 04/24/18  6:13 PM  Result Value Ref Range Status   Specimen Description   Final    URINE, CLEAN CATCH Performed at [facility], [place] [address], [place], [place] [address]    Special Requests   Final    NONE Performed at [facility], [place] [address] [place], [place] [address]    Culture MULTIPLE SPECIES PRESENT, SUGGEST RECOLLECTION (A)  Final   Report Status 04/26/2018 FINAL  Final    Culture, body fluid-bottle     Status: None (Preliminary result)   Collection Time: 04/25/18 10:48 AM  Result Value Ref Range Status   Specimen Description PERITONEAL  Final   Special Requests NONE  Final   Culture   Final    NO GROWTH 2 DAYS Performed at [facility], [place] [address] [place], [place] [address]    Report Status PENDING  Incomplete  Gram stain     Status: None   Collection Time: 04/25/18 10:48 AM  Result Value Ref Range Status   Specimen Description PERITONEAL  Final   Special Requests NONE  Final   Gram Stain   Final    FEW WBC PRESENT, PREDOMINANTLY MONONUCLEAR NO ORGANISMS SEEN Performed at [facility], [address], [place], [place] [address]    Report Status 04/25/2018 FINAL  Final     SIGNED:   Cordelia Poche, MD [facility] 04/28/2018, 11:59 AM

## 2018-04-28 NOTE — Clinical Social Work Placement (Signed)
   CLINICAL SOCIAL WORK PLACEMENT  NOTE  Date:  04/28/2018  Patient Details  Name: Latasha Guzman MRN: [id number] Date of Birth: 08/04/1945  Clinical Social Work is seeking post-discharge placement for this patient at the [place] level of care (*CSW will initial, date and re-position this form in  chart as items are completed):  Yes   Patient/family provided with [facility] Work Department's list of facilities offering this level of care within the geographic area requested by the patient (or if unable, by the patient's family).  Yes   Patient/family informed of their freedom to choose among providers that offer the needed level of care, that participate in Medicare, Medicaid or managed care program needed by the patient, have an available bed and are willing to accept the patient.  Yes   Patient/family informed of [facility]'s ownership interest in [facility] and [facility], as well as of the fact that they are under no obligation to receive care at these facilities.  PASRR submitted to EDS on       PASRR number received on       Existing PASRR number confirmed on 04/28/18     FL2 transmitted to all facilities in geographic area requested by pt/family on 04/28/18     FL2 transmitted to all facilities within larger geographic area on       Patient informed that his/her managed care company has contracts with or will negotiate with certain facilities, including the following:            Patient/family informed of bed offers received.  Patient chooses bed at [facility])     Physician recommends and patient chooses bed at      Patient to be transferred to ([place]) on 04/28/18.  Patient to be transferred to facility by ptar     Patient family notified on 04/28/18 of transfer.  Name of family member notified:  spoke with pt niece via phone      PHYSICIAN       Additional Comment:     _______________________________________________ Wende Neighbors, LCSW 04/28/2018, 12:01 PM

## 2018-04-28 NOTE — Progress Notes (Signed)
Clinical Social Worker facilitated patient discharge including contacting patient family and facility to confirm patient discharge plans.  Clinical information faxed to facility and family agreeable with plan.  CSW arranged ambulance transport via [facility] to [facility] .  RN to call [phone] (rm# [id number]) for report prior to discharge.  Clinical Social Worker will sign off for now as social work intervention is no longer needed. Please consult [place] again if new need arises.  Rhea Pink, MSW, [place]

## 2018-04-28 NOTE — Progress Notes (Signed)
Report called to [place] at [facility].

## 2018-04-28 NOTE — Progress Notes (Signed)
PROGRESS NOTE    Latasha Guzman  DUK:[id number] DOB: [birth year] DOA: 04/23/2018 PCP: Nicoletta Dress, MD   Brief Narrative: Latasha Guzman is a 73 y.o. femalewithhistory of atrial fibrillation on Xarelto, diastolic CHF, diabetes mellitus. She presented secondary to GI bleeding. She has been treated conservatively.   Assessment & Plan:   Principal Problem:   Acute GI bleeding Active Problems:   Essential hypertension   Chronic diastolic CHF (congestive heart failure) (HCC)   Symptomatic anemia   Ascites   Cirrhosis of liver with ascites (HCC)   Rectal bleeding   Grade III hemorrhoids   Chronic anticoagulation   Hematochezia Patient evaluated by GI. Patient with a previous workup from 2019. Concern for hemorrhoidal vs stercoral ulcer vs diverticular etiology. Symptoms resolved. Patient received 2 units of PRBC without recurrent bleeding. Follow CBC as an outpatient. GI follow-up outpatient with recommendations to keep bowels soft and regular (avoid constipation). Hemoglobin improved from 7.7 to 9.2 s/p blood transfusion. Repeat CBC in 1 week, sooner if recurrent bleeding.  Atrial fibrillation Patient is on Xarelto as an outpatient which was held secondary to GI bleeding. Also on Cardizem and metoprolol which were continued. Xarelto okay to restart in 5 days (Sunday, March 8th) per GI recommendations.  Chronic diastolic heart failure Patient is on lasix as an outpatient. Held secondary to blood pressure intolerance. Will likely benefit from a reduced dose on discharge.  Cirrhosis Patient with associated ascites. Patient underwent paracentesis on 3/3 yielding 2.7 L of yellow fluid. No evidence of SBP on lab work. Fluid culture no growth to date. Cytology pending. Cell count unremarkable.  CKD stage III Stable. Baseline creatine 1.1-1.3.  Anemia Chronic. Likely in setting of chronic disease vs chronic blood loss vs combination. No ferritin obtained, however. Patient already  received blood.  Acute urinary retention Patient with bladder scan significant for >800 ml retained. Foley catheter placed. Patient will need outpatient urology follow-up.  Pressure injury Left hip, POA  S/p pacemaker  DVT prophylaxis: SCDs Code Status:   Code Status: Full Code Family Communication: None at bedside Disposition Plan: Medically stable for discharge. Pending bed placement   Consultants:   [facility]  Procedures:   None  Antimicrobials:  None    Subjective: No bloody stools. Was confused about time of day since being moved from the ICU/stepdown unit.  Objective: Vitals:   04/27/18 1407 04/27/18 1545 04/27/18 2130 04/28/18 0555  BP: 109/75  103/64 102/70  Pulse: (!) 102 (!) 119 (!) 107 (!) 104  Resp: 18  17 16   Temp: 99.6 F (37.6 C) 99.5 F (37.5 C) 98.9 F (37.2 C) 99.8 F (37.7 C)  TempSrc: Oral Oral Oral Oral  SpO2: 97% 99% 97% 96%  Weight:      Height:        Intake/Output Summary (Last 24 hours) at 04/28/2018 1116 Last data filed at 04/28/2018 3762 Gross per 24 hour  Intake 444.55 ml  Output 775 ml  Net -330.45 ml   Filed Weights   04/23/18 1644 04/23/18 1732 04/24/18 0220  Weight: 102.1 kg 102.1 kg 73.7 kg    Examination:  General exam: Appears calm and comfortable Respiratory system: Respiratory effort normal. Gastrointestinal system: Abdomen is nondistended Central nervous system: Sleeping. Extremities: left foot/leg edema (seen under SCDs/prevalon boot)      Data Reviewed: I have personally reviewed following labs and imaging studies  CBC: Recent Labs  Lab 04/23/18 1703 04/24/18 0541 04/25/18 0335  WBC 12.3* 8.9 7.0  NEUTROABS 9.2*  --   --   HGB 7.7* 7.7* 9.2*  HCT 25.6* 25.0* 29.7*  MCV 95.2 94.7 91.1  PLT 495* 243 960   Basic Metabolic Panel: Recent Labs  Lab 04/23/18 1703 04/24/18 0541 04/25/18 0335  NA 134* 135 133*  K 4.1 3.2* 3.9  CL 101 103 104  CO2 25 26 22   GLUCOSE 94 76 75  BUN 37* 39* 38*    CREATININE 1.31* 1.26* 1.31*  CALCIUM 8.5* 7.9* 8.2*   GFR: Estimated Creatinine Clearance: 39.2 mL/min (A) (by C-G formula based on SCr of 1.31 mg/dL (H)). Liver Function Tests: Recent Labs  Lab 04/23/18 1703 04/24/18 0541 04/25/18 0335  AST 69* 64* 66*  ALT 15 15 17   ALKPHOS 93 75 80  BILITOT 2.4* 2.3* 3.0*  PROT 5.4* 4.7* 5.1*  ALBUMIN 1.9* 1.8* 1.9*   Recent Labs  Lab 04/23/18 1703  LIPASE 39   No results for input(s): AMMONIA in the last 168 hours. Coagulation Profile: Recent Labs  Lab 04/23/18 1703  INR 2.1*   Cardiac Enzymes: No results for input(s): CKTOTAL, CKMB, CKMBINDEX, TROPONINI in the last 168 hours. BNP (last 3 results) No results for input(s): PROBNP in the last 8760 hours. HbA1C: No results for input(s): HGBA1C in the last 72 hours. CBG: Recent Labs  Lab 04/27/18 1541 04/27/18 1947 04/27/18 2357 04/28/18 0413 04/28/18 0747  GLUCAP 83 83 106* 78 79   Lipid Profile: No results for input(s): CHOL, HDL, LDLCALC, TRIG, CHOLHDL, LDLDIRECT in the last 72 hours. Thyroid Function Tests: No results for input(s): TSH, T4TOTAL, FREET4, T3FREE, THYROIDAB in the last 72 hours. Anemia Panel: No results for input(s): VITAMINB12, FOLATE, FERRITIN, TIBC, IRON, RETICCTPCT in the last 72 hours. Sepsis Labs: No results for input(s): PROCALCITON, LATICACIDVEN in the last 168 hours.  Recent Results (from the past 240 hour(s))  MRSA PCR Screening     Status: None   Collection Time: 04/24/18  2:38 AM  Result Value Ref Range Status   MRSA by PCR NEGATIVE NEGATIVE Final    Comment:        The GeneXpert MRSA Assay (FDA approved for NASAL specimens only), is one component of a comprehensive MRSA colonization surveillance program. It is not intended to diagnose MRSA infection nor to guide or monitor treatment for MRSA infections. Performed at [facility], [place] [address], [place], [place] [address]   Culture, Urine     Status: Abnormal    Collection Time: 04/24/18  6:13 PM  Result Value Ref Range Status   Specimen Description   Final    URINE, CLEAN CATCH Performed at [facility], [place] [address], [place], [place] [address]    Special Requests   Final    NONE Performed at [facility], [place] [address] [place], [place] [address]    Culture MULTIPLE SPECIES PRESENT, SUGGEST RECOLLECTION (A)  Final   Report Status 04/26/2018 FINAL  Final  Culture, body fluid-bottle     Status: None (Preliminary result)   Collection Time: 04/25/18 10:48 AM  Result Value Ref Range Status   Specimen Description PERITONEAL  Final   Special Requests NONE  Final   Culture   Final    NO GROWTH 2 DAYS Performed at [facility], [place] [address], [place], [place] [address]    Report Status PENDING  Incomplete  Gram stain     Status: None   Collection Time: 04/25/18 10:48 AM  Result Value  Ref Range Status   Specimen Description PERITONEAL  Final   Special Requests NONE  Final   Gram Stain   Final    FEW WBC PRESENT, PREDOMINANTLY MONONUCLEAR NO ORGANISMS SEEN Performed at [facility], [place] [address], [place], [place] [address]    Report Status 04/25/2018 FINAL  Final         Radiology Studies: No results found.      Scheduled Meds: . Chlorhexidine Gluconate Cloth  6 each Topical Daily  . diltiazem  60 mg Oral Q6H  . hydrocortisone   Rectal BID  . mouth rinse  15 mL Mouth Rinse BID  . metoprolol tartrate  12.5 mg Oral BID  . pantoprazole  40 mg Oral Daily  . polyethylene glycol  17 g Oral BID  . traZODone  25 mg Oral QHS   Continuous Infusions: . sodium chloride 10 mL/hr at 04/26/18 1709     LOS: 5 days     Cordelia Poche, MD [facility] 04/28/2018, 11:16 AM  If 7PM-7AM, please contact night-coverage [email or web address]

## 2018-04-30 LAB — CULTURE, BODY FLUID W GRAM STAIN -BOTTLE: Culture: NO GROWTH

## 2018-06-23 DEATH — deceased

## 2019-03-21 IMAGING — CT CT ABD-PELV W/O CM
2 of 4 series · 16 of 46 positions shown, 18 images · non-contrast
Comparison: None.

CLINICAL DATA: Intermittent GI bleeding

EXAM:
CT ABDOMEN AND PELVIS WITHOUT CONTRAST
TECHNIQUE: Multidetector CT imaging of the abdomen and pelvis was performed
following the standard protocol without IV contrast.

[Series 2: axial st · axial · 0.95mm/px · z∈[-612,-212]mm · 13 of 90 slices shown, 15 images]
[im 5/90  soft-tissue]
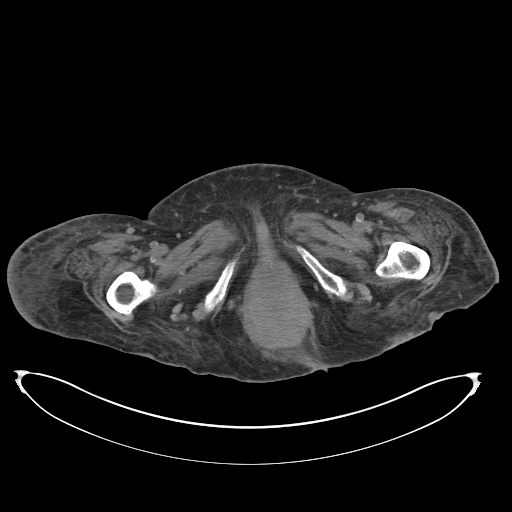
[im 5/90  bone]
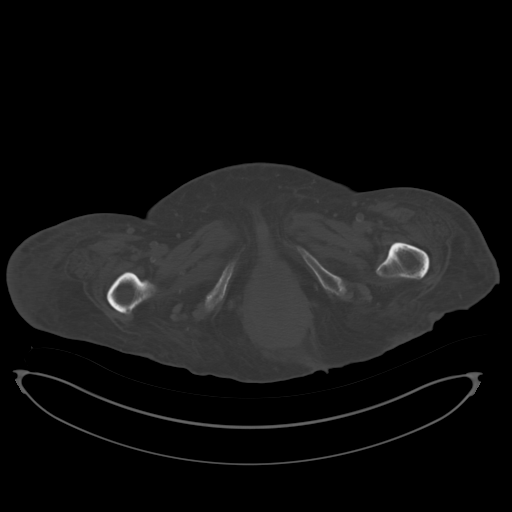
[im 14/90  soft-tissue]
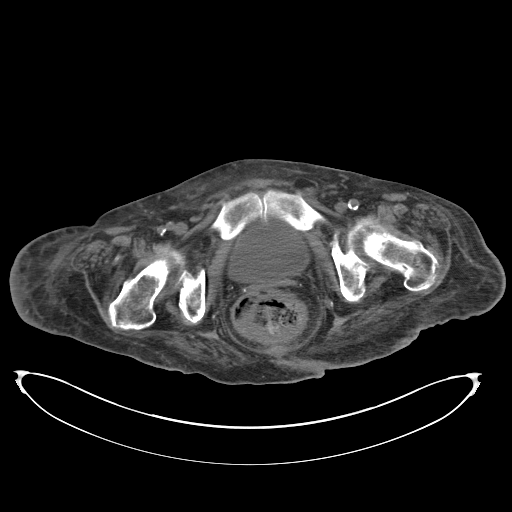
[im 18/90  soft-tissue]
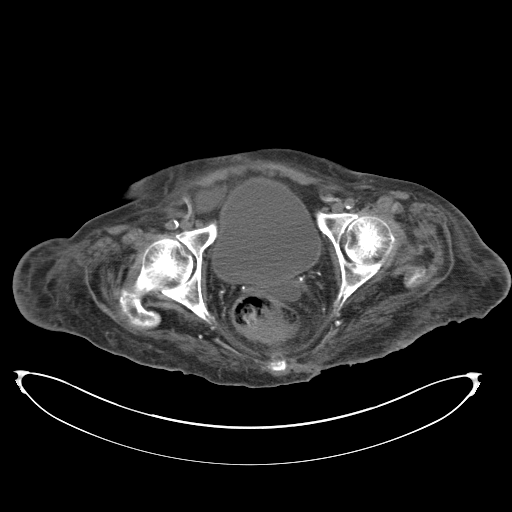
[im 27/90  soft-tissue]
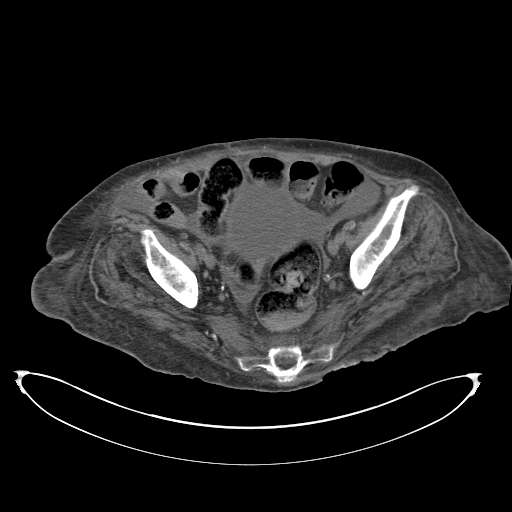
[im 32/90  soft-tissue]
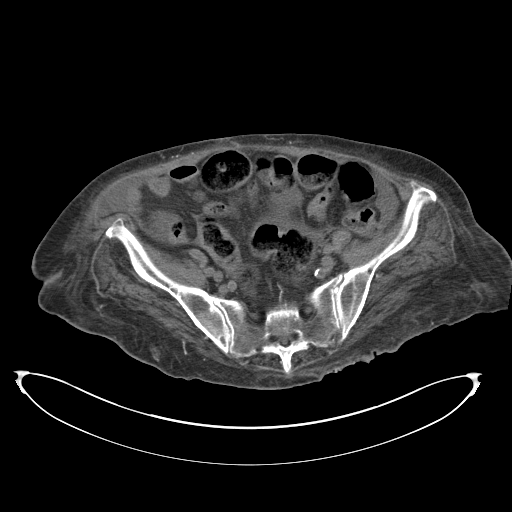
[im 41/90  soft-tissue]
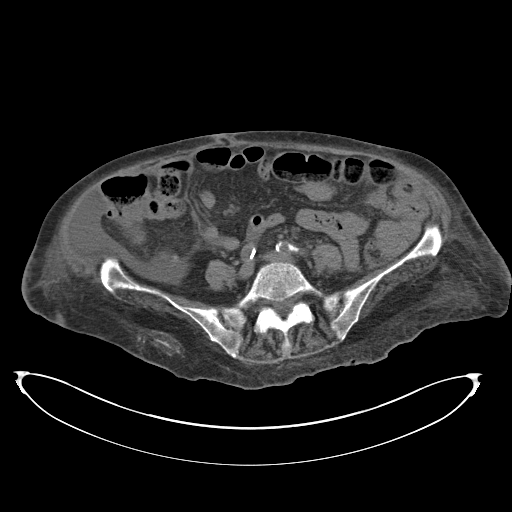
[im 45/90  soft-tissue]
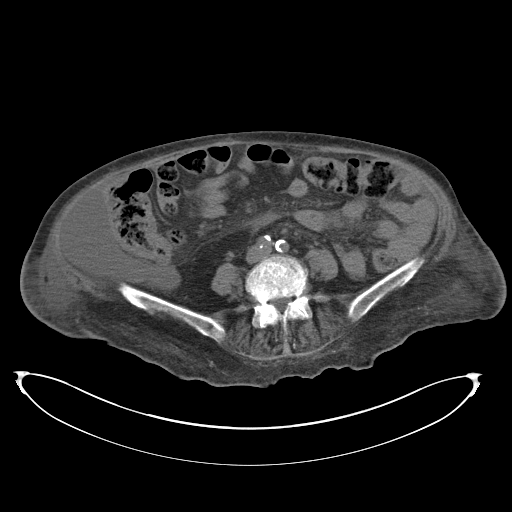
[im 49/90  soft-tissue]
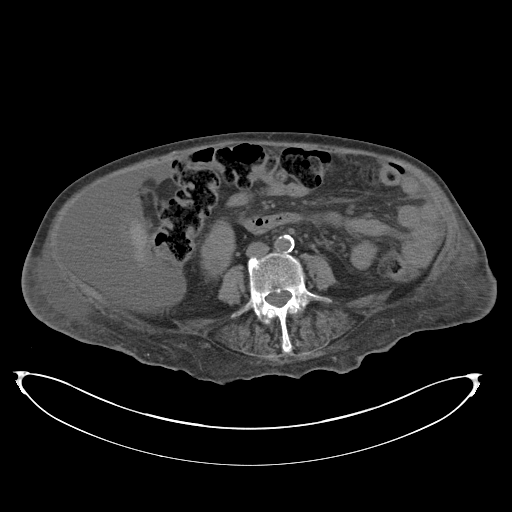
[im 58/90  soft-tissue]
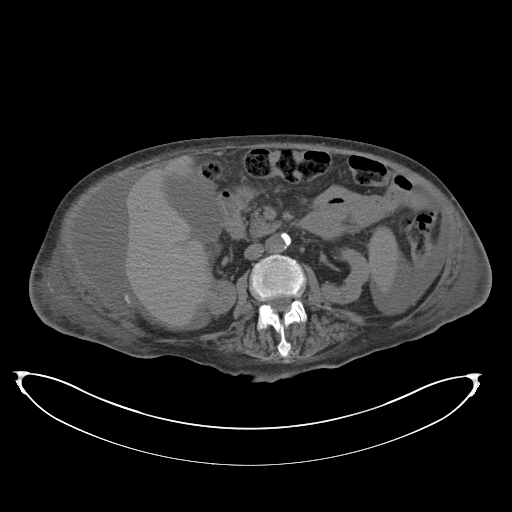
[im 58/90  bone]
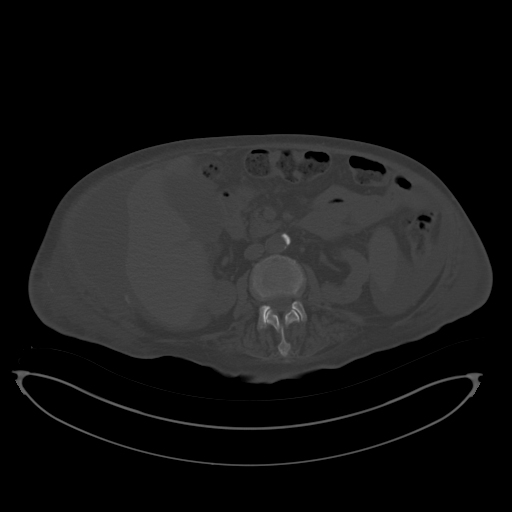
[im 63/90  soft-tissue]
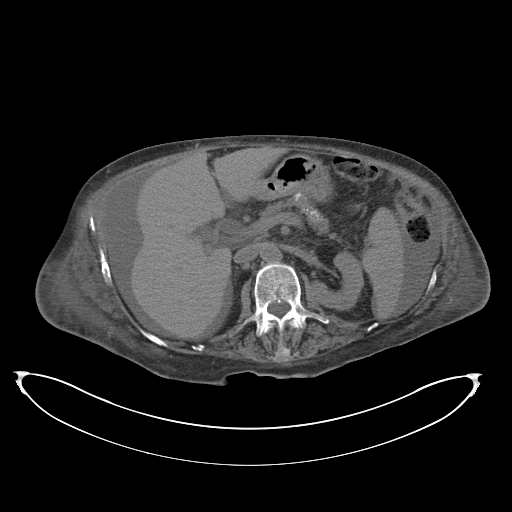
[im 72/90  soft-tissue]
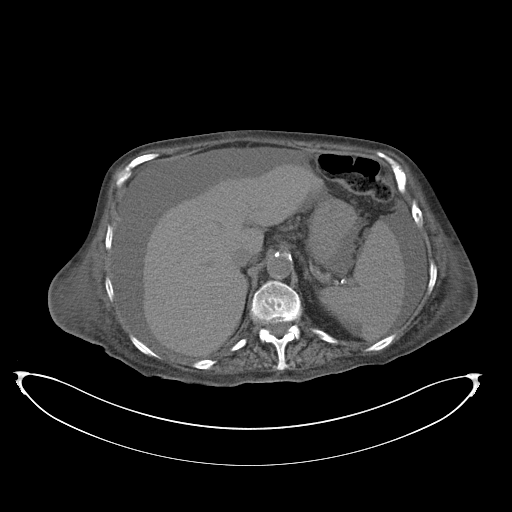
[im 76/90  soft-tissue]
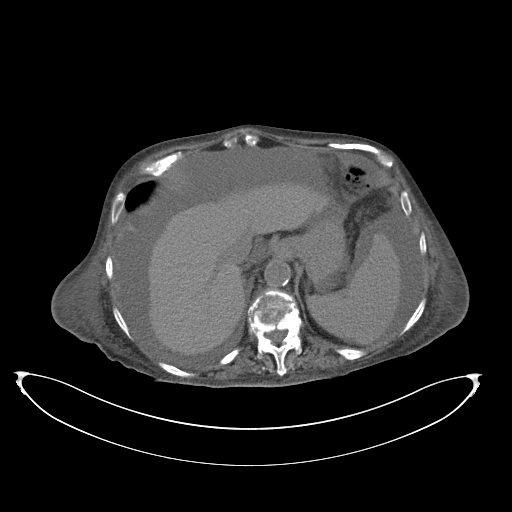
[im 85/90  soft-tissue]
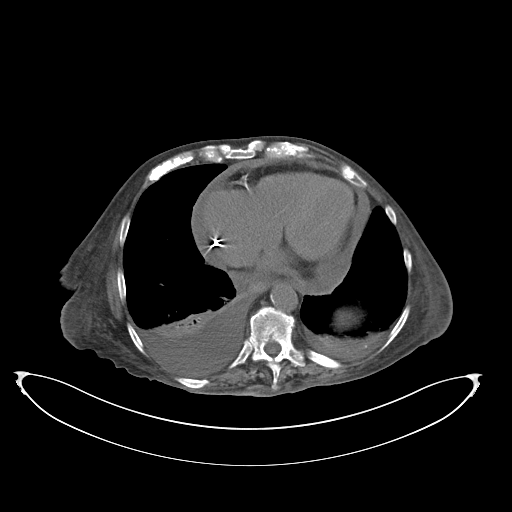

[Series 5: coronal st · coronal · 0.88mm/px · 3 of 128 slices shown]
[im 43/128  soft-tissue]
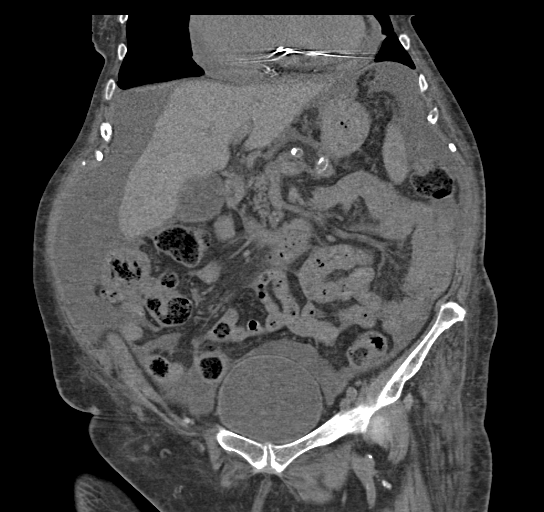
[im 57/128  soft-tissue]
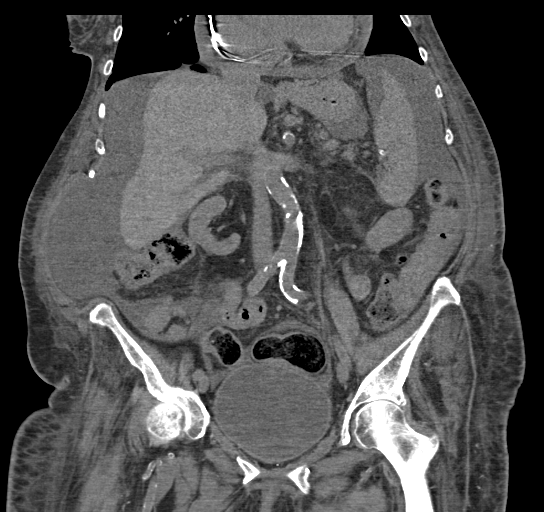
[im 71/128  soft-tissue]
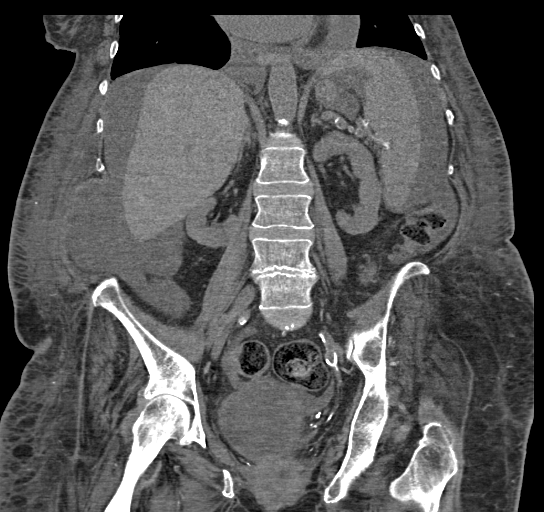

[16 of 46 positions shown; findings below may reference images not displayed]

FINDINGS: Lower chest: Moderate right and small left pleural effusions.
Associated bibasilar atelectasis.

Cardiomegaly. Small pericardial effusion. Hypodense blood pool
relative to myocardium, suggesting anemia. Pacemaker leads,
incompletely visualized.

Hepatobiliary: Unenhanced liver is notable for a mildly nodular
hepatic contour (series 2/image 24), suggesting cirrhosis.

Gallbladder is distended but grossly unremarkable. No intrahepatic
or extrahepatic ductal dilatation.

Pancreas: Within normal limits.

Spleen: Borderline enlargement of the spleen, measuring 14.1 cm in
craniocaudal dimension (coronal image 75).

Adrenals/Urinary Tract: Adrenal glands are within normal limits.

Kidneys are grossly unremarkable.  No hydronephrosis.

Bladder is within normal limits.

Stomach/Bowel: Stomach is within normal limits.

No evidence of bowel obstruction.

Normal appendix (series 2/image 54).

Mild rectal wall thickening (series 2/image 78).

Vascular/Lymphatic: No evidence of abdominal aortic aneurysm.

Atherosclerotic calcifications of the abdominal aorta and branch
vessels.

No suspicious abdominopelvic lymphadenopathy.

Reproductive: Status post hysterectomy.

No adnexal masses.

Other: Moderate abdominopelvic ascites.

Musculoskeletal: Moderate to severe compression fracture deformity
at T12, with possible pathologic fracture given the lytic/sclerotic
appearance. No retropulsion.
IMPRESSION: Mild rectal wall thickening, nonspecific. Correlate for
infectious/inflammatory prostatitis. Neoplasm is considered
unlikely, but endoscopic correlation is suggested.

Cirrhosis with moderate abdominopelvic ascites and borderline
splenomegaly.

Moderate to severe compression fracture deformity at T12, possibly
pathologic. No retropulsion.

Moderate right and small left pleural effusions.
